# Patient Record
Sex: Male | Born: 1965 | Hispanic: No | Marital: Married | Smoking: Current every day smoker
Health system: Southern US, Community
[De-identification: ages and names within clinical notes are randomized; demographics above are authoritative.]

## PROBLEM LIST (undated history)

## (undated) DIAGNOSIS — E785 Hyperlipidemia, unspecified: Secondary | ICD-10-CM

## (undated) DIAGNOSIS — I739 Peripheral vascular disease, unspecified: Secondary | ICD-10-CM

## (undated) DIAGNOSIS — Q2572 Congenital pulmonary arteriovenous malformation: Principal | ICD-10-CM

## (undated) DIAGNOSIS — R7301 Impaired fasting glucose: Secondary | ICD-10-CM

## (undated) DIAGNOSIS — F17200 Nicotine dependence, unspecified, uncomplicated: Secondary | ICD-10-CM

## (undated) DIAGNOSIS — I350 Nonrheumatic aortic (valve) stenosis: Secondary | ICD-10-CM

## (undated) DIAGNOSIS — M549 Dorsalgia, unspecified: Secondary | ICD-10-CM

## (undated) DIAGNOSIS — R2689 Other abnormalities of gait and mobility: Secondary | ICD-10-CM

## (undated) DIAGNOSIS — H269 Unspecified cataract: Secondary | ICD-10-CM

## (undated) DIAGNOSIS — I6529 Occlusion and stenosis of unspecified carotid artery: Secondary | ICD-10-CM

## (undated) DIAGNOSIS — I700 Atherosclerosis of aorta: Secondary | ICD-10-CM

## (undated) DIAGNOSIS — I351 Nonrheumatic aortic (valve) insufficiency: Secondary | ICD-10-CM

## (undated) DIAGNOSIS — I10 Essential (primary) hypertension: Secondary | ICD-10-CM

## (undated) HISTORY — DX: Other abnormalities of gait and mobility: R26.89

## (undated) HISTORY — DX: Hyperlipidemia, unspecified: E78.5

## (undated) HISTORY — DX: Nicotine dependence, unspecified, uncomplicated: F17.200

## (undated) HISTORY — DX: Atherosclerosis of aorta: I70.0

## (undated) HISTORY — PX: CATARACT EXTRACTION: SUR2

## (undated) HISTORY — DX: Impaired fasting glucose: R73.01

## (undated) HISTORY — DX: Occlusion and stenosis of unspecified carotid artery: I65.29

## (undated) HISTORY — DX: Dorsalgia, unspecified: M54.9

## (undated) HISTORY — DX: Peripheral vascular disease, unspecified: I73.9

## (undated) HISTORY — DX: Unspecified cataract: H26.9

## (undated) HISTORY — DX: Nonrheumatic aortic (valve) insufficiency: I35.1

---

## 1999-03-09 ENCOUNTER — Emergency Department (HOSPITAL_COMMUNITY): Admission: EM | Admit: 1999-03-09 | Discharge: 1999-03-09 | Payer: Self-pay | Admitting: *Deleted

## 2004-03-30 ENCOUNTER — Emergency Department (HOSPITAL_COMMUNITY): Admission: EM | Admit: 2004-03-30 | Discharge: 2004-03-30 | Payer: Self-pay | Admitting: Emergency Medicine

## 2004-09-28 ENCOUNTER — Emergency Department (HOSPITAL_COMMUNITY): Admission: EM | Admit: 2004-09-28 | Discharge: 2004-09-28 | Payer: Self-pay | Admitting: Emergency Medicine

## 2005-02-08 ENCOUNTER — Emergency Department (HOSPITAL_COMMUNITY): Admission: EM | Admit: 2005-02-08 | Discharge: 2005-02-08 | Payer: Self-pay | Admitting: Emergency Medicine

## 2013-10-08 ENCOUNTER — Other Ambulatory Visit: Payer: Self-pay | Admitting: *Deleted

## 2013-10-08 DIAGNOSIS — M79609 Pain in unspecified limb: Secondary | ICD-10-CM

## 2013-10-21 ENCOUNTER — Encounter: Payer: Self-pay | Admitting: Vascular Surgery

## 2013-10-28 ENCOUNTER — Encounter: Payer: Self-pay | Admitting: Vascular Surgery

## 2013-10-29 ENCOUNTER — Ambulatory Visit (INDEPENDENT_AMBULATORY_CARE_PROVIDER_SITE_OTHER): Payer: Managed Care, Other (non HMO) | Admitting: Vascular Surgery

## 2013-10-29 ENCOUNTER — Encounter: Payer: Self-pay | Admitting: Vascular Surgery

## 2013-10-29 ENCOUNTER — Ambulatory Visit (HOSPITAL_COMMUNITY)
Admission: RE | Admit: 2013-10-29 | Discharge: 2013-10-29 | Disposition: A | Discharge status: Home / Self Care | Payer: Managed Care, Other (non HMO) | Source: Ambulatory Visit | Attending: Vascular Surgery | Admitting: Vascular Surgery

## 2013-10-29 VITALS — BP 151/77 | HR 74 | Ht 68.0 in | Wt 169.3 lb

## 2013-10-29 DIAGNOSIS — I739 Peripheral vascular disease, unspecified: Secondary | ICD-10-CM

## 2013-10-29 DIAGNOSIS — M79609 Pain in unspecified limb: Secondary | ICD-10-CM | POA: Insufficient documentation

## 2013-10-29 NOTE — Progress Notes (Signed)
Patient name: Marily LenteMustapha Drost MRN: [id number] DOB: 11/19/1965 Sex: male   Referred by: Di KindleAltisar  Reason for referral:  Chief Complaint  Patient presents with  . New Evaluation    intermittent claudication Lt LE    HISTORY OF PRESENT ILLNESS:  The patient presents today for evaluation of left calf discomfort. He is a very pleasant 48 year old gentleman smoker. He reports that he was recently traveling out of the country and was doing a great deal of walking particularly up and down stairs and up and down hills. He noted pain in his left calf that resolved with resting. He specifically denies any thigh pain. On his routine daily activity at home he does not notice a great deal of discomfort associated with this. He does report that when he mows his yard with a walking lower that he does notice the same sensation that resolves with rest. He denies any prior history of tissue loss or arterial rest pain. Has no cardiac or cerebrovascular past medical history  Past Medical History  Diagnosis Date  . Peripheral vascular disease   . Dizziness and giddiness   . Back pain   . Loss of balance     History reviewed. No pertinent past surgical history.  History   Social History  . Marital Status: Married    Spouse Name: N/A    Number of Children: N/A  . Years of Education: N/A   Occupational History  . Not on file.   Social History Main Topics  . Smoking status: Current Every Day Smoker -- 0.50 packs/day for 30 years    Types: Cigarettes  . Smokeless tobacco: Never Used  . Alcohol Use: 3.6 oz/week    6 Cans of beer per week  . Drug Use: No  . Sexual Activity: Not on file   Other Topics Concern  . Not on file   Social History Narrative  . No narrative on file    Family History  Problem Relation Age of Onset  . CVA Mother     Allergies as of 10/29/2013  . (No Known Allergies)    Current Outpatient Prescriptions on File Prior to Visit  Medication Sig Dispense  Refill  . meclizine (ANTIVERT) 25 MG tablet Take 25 mg by mouth 3 (three) times daily.       No current facility-administered medications on file prior to visit.     REVIEW OF SYSTEMS:  Positives indicated with an "X"  CARDIOVASCULAR:  [ ]  chest pain   [ ]  chest pressure   [ ]  palpitations   [ ]  orthopnea   [ ]  dyspnea on exertion   [x ] claudication   [ ]  rest pain   [ ]  DVT   [ ]  phlebitis PULMONARY:   [ ]  productive cough   [ ]  asthma   [ ]  wheezing NEUROLOGIC:   [ ]  weakness  [ ]  paresthesias  [ ]  aphasia  [ ]  amaurosis  [ ]  dizziness HEMATOLOGIC:   [ ]  bleeding problems   [ ]  clotting disorders MUSCULOSKELETAL:  [ ]  joint pain   [ ]  joint swelling GASTROINTESTINAL: [ ]   blood in stool  [ ]   hematemesis GENITOURINARY:  [ ]   dysuria  [ ]   hematuria PSYCHIATRIC:  [ ]  history of major depression INTEGUMENTARY:  [ ]  rashes  [ ]  ulcers CONSTITUTIONAL:  [ ]  fever   [ ]  chills  PHYSICAL EXAMINATION:  General: The patient is a well-nourished male, in no  acute distress. Vital signs are BP 151/77  Pulse 74  Ht 5\' 8"  (1.727 m)  Wt 169 lb 4.8 oz (76.794 kg)  BMI 25.75 kg/m2  SpO2 98% Pulmonary: There is a good air exchange bilaterally without wheezing or rales. Abdomen: Soft and non-tender with normal pitch bowel sounds. Musculoskeletal: There are no major deformities.  There is no significant extremity pain. Neurologic: No focal weakness or paresthesias are detected, Skin: There are no ulcer or rashes noted. Psychiatric: The patient has normal affect. Cardiovascular: There is a regular rate and rhythm without significant murmur appreciated. Carotid arteries without bruits bilaterally Pulse status 2+ radial and 2+ femoral pulses bilaterally. Absent left popliteal and left distal pulses. On the right he has a diminished dorsalis pedis pulse but does have a palpable posterior tibial pulse which is normal.  [facility] Vascular Lab Studies:  Ordered and Independently Reviewed this reveals a  normal ankle arm index on the right with normal triphasic waveforms. On the left he has monophasic waveforms and ankle arm index of 0.52.  Impression and Plan:  Left leg intermittent claudication related to left superficial femoral artery occlusive disease. After extensive discussion with the patient. I explained the etiology of this. I explained this is directly related to cigarette smoking and the critical need for cigarette smoking cessation. I did explain that the smoking cessation would slow his progression and would also allow him to walk further before he developed claudication symptoms. Also explained the importance of exercise walking program. I explained he could have further evaluation with arteriography and revascularization either with balloon and stenting over surgical bypass. I explained we would certainly recommend reserving this for an intolerable symptoms. He currently is able to tolerate his level of claudication. He will work on his smoking cessation walking program. We will see him again in 3 months for continued discussion. I did explain symptoms of critical limb ischemia to him and he will notify us immediately should this occur. I explained this would be highly unlikely    Jamiesha Victoria [facility] [place] Office: [phone]

## 2014-01-28 ENCOUNTER — Ambulatory Visit: Payer: Managed Care, Other (non HMO) | Admitting: Vascular Surgery

## 2014-02-17 ENCOUNTER — Encounter: Payer: Self-pay | Admitting: Vascular Surgery

## 2014-02-18 ENCOUNTER — Ambulatory Visit: Payer: Managed Care, Other (non HMO) | Admitting: Vascular Surgery

## 2014-03-17 ENCOUNTER — Encounter: Payer: Self-pay | Admitting: Vascular Surgery

## 2014-03-18 ENCOUNTER — Encounter: Payer: Self-pay | Admitting: Vascular Surgery

## 2014-03-18 ENCOUNTER — Ambulatory Visit (INDEPENDENT_AMBULATORY_CARE_PROVIDER_SITE_OTHER): Payer: Managed Care, Other (non HMO) | Admitting: Vascular Surgery

## 2014-03-18 VITALS — BP 134/80 | HR 78 | Resp 16 | Ht 68.5 in | Wt 177.0 lb

## 2014-03-18 DIAGNOSIS — I70219 Atherosclerosis of native arteries of extremities with intermittent claudication, unspecified extremity: Secondary | ICD-10-CM

## 2014-03-18 NOTE — Progress Notes (Signed)
Here today for continued discussion of his left leg claudication. He reports that he is trying to quit smoking but still does smoke. He is being very active in his walking program. He reports that if he does a great deal of walking at [employer] he senses some calf claudication but his routine activities are tolerable. He has no tissue loss and the rest pain  On physical exam he has no change. He has no tissue loss in his lower extremity. Does have a palpable femoral and absent popliteal pulse on the left.  I again reviewed the superficial femoral artery occlusion with the patient. I have recommended against any intervention at this time. He is tolerating this level claudication. I did explain the critical importance of walking program and smoking cessation. He does notify should he develop any tissue loss or worsening pain. Otherwise we see on as-needed basis. Did discuss about recommend daily baby aspirin therapy for prevention of atherosclerotic progression

## 2014-09-09 ENCOUNTER — Institutional Professional Consult (permissible substitution): Payer: Self-pay | Admitting: Medical

## 2014-09-16 ENCOUNTER — Ambulatory Visit (INDEPENDENT_AMBULATORY_CARE_PROVIDER_SITE_OTHER): Payer: Managed Care, Other (non HMO) | Admitting: Medical

## 2014-09-16 ENCOUNTER — Encounter: Payer: Self-pay | Admitting: Medical

## 2014-09-16 VITALS — BP 170/80 | HR 78 | Temp 98.4°F | Resp 14 | Ht 68.0 in | Wt 169.0 lb

## 2014-09-16 DIAGNOSIS — R011 Cardiac murmur, unspecified: Secondary | ICD-10-CM | POA: Diagnosis not present

## 2014-09-16 DIAGNOSIS — I70209 Unspecified atherosclerosis of native arteries of extremities, unspecified extremity: Secondary | ICD-10-CM | POA: Diagnosis not present

## 2014-09-16 DIAGNOSIS — Z720 Tobacco use: Secondary | ICD-10-CM | POA: Diagnosis not present

## 2014-09-16 DIAGNOSIS — F17200 Nicotine dependence, unspecified, uncomplicated: Secondary | ICD-10-CM

## 2014-09-16 DIAGNOSIS — I10 Essential (primary) hypertension: Secondary | ICD-10-CM

## 2014-09-16 LAB — HEMOGLOBIN A1C
Hgb A1c MFr Bld: 5.8 % — ABNORMAL HIGH (ref <5.7)
Mean Plasma Glucose: 120 mg/dL — ABNORMAL HIGH (ref <117)

## 2014-09-16 LAB — CBC
HCT: 47.1 % (ref 39.0–52.0)
Hemoglobin: 16.6 g/dL (ref 13.0–17.0)
MCH: 33.2 pg (ref 26.0–34.0)
MCHC: 35.2 g/dL (ref 30.0–36.0)
MCV: 94.2 fL (ref 78.0–100.0)
MPV: 10.3 fL (ref 8.6–12.4)
Platelets: 200 10*3/uL (ref 150–400)
RBC: 5 MIL/uL (ref 4.22–5.81)
RDW: 14 % (ref 11.5–15.5)
WBC: 7.8 10*3/uL (ref 4.0–10.5)

## 2014-09-16 LAB — COMPREHENSIVE METABOLIC PANEL
ALT: 103 U/L — ABNORMAL HIGH (ref 0–53)
AST: 47 U/L — ABNORMAL HIGH (ref 0–37)
Albumin: 4.4 g/dL (ref 3.5–5.2)
Alkaline Phosphatase: 56 U/L (ref 39–117)
BUN: 9 mg/dL (ref 6–23)
CO2: 23 mEq/L (ref 19–32)
Calcium: 9.7 mg/dL (ref 8.4–10.5)
Chloride: 104 mEq/L (ref 96–112)
Creat: 0.76 mg/dL (ref 0.50–1.35)
Glucose, Bld: 93 mg/dL (ref 70–99)
Potassium: 4.2 mEq/L (ref 3.5–5.3)
Sodium: 139 mEq/L (ref 135–145)
Total Bilirubin: 0.7 mg/dL (ref 0.2–1.2)
Total Protein: 7.5 g/dL (ref 6.0–8.3)

## 2014-09-16 LAB — LIPID PANEL
Cholesterol: 261 mg/dL — ABNORMAL HIGH (ref 0–200)
HDL: 39 mg/dL — ABNORMAL LOW (ref >=40)
LDL Cholesterol: 197 mg/dL — ABNORMAL HIGH (ref 0–99)
Total CHOL/HDL Ratio: 6.7 Ratio
Triglycerides: 127 mg/dL (ref <150)
VLDL: 25 mg/dL (ref 0–40)

## 2014-09-16 LAB — TSH: TSH: 0.317 u[IU]/mL — ABNORMAL LOW (ref 0.350–4.500)

## 2014-09-16 NOTE — Progress Notes (Signed)
Subjective: Here as a new patient.   From [place] originally, been [place] years.   Been seeing dermatologist for skin issues of legs, recently given triamcinolone cream, but dermatologist advised he see PCP to be evaluated for vascular or diabetic issues that may be causing his skin issues.   He has a 25 pack year smoking hx/o, smokers 1ppd, and has hx/o peripheral vascular disease in the last year, left leg, advised to consider revascularization surgery but for now is holding off since the pain is management in the legs/ claudication.    He denies SOB, chest pain, edema, no hx/o MI or hypertension.   No other aggravating or relieving factors. No other complaint.  No other prior medical history Mom with hx/o stroke, but no other significant family history He does drink 2-3 beers nightly  ROS as in subjective  Objective: BP 170/80 mmHg  Pulse 78  Temp(Src) 98.4 F (36.9 C) (Oral)  Resp 14  Ht 5\' 8"  (1.727 m)  Wt 169 lb (76.658 kg)  BMI 25.70 kg/m2  General appearance: alert, no distress, WD/WN Neck: supple, no lymphadenopathy, no thyromegaly, no masses, no bruits Heart: 2/6 systolic murmur heard best in left lower sternal border, otherwise RRR, normal S1, S2 Lungs: CTA bilaterally, no wheezes, rhonchi, or rales Abdomen: +bs, soft, non tender, non distended, no masses, no hepatomegaly, no splenomegaly, no bruits Extremities: no edema, no cyanosis, no clubbing Pulses: absent left pedal pulses, otherwise 2+ symmetric, upper and lower extremities, normal cap refill Neurological: alert, oriented x 3, CN2-12 intact, strength normal upper extremities and lower extremities, sensation normal throughout, DTRs 2+ throughout, no cerebellar signs, gait normal Psychiatric: normal affect, behavior normal, pleasant     Adult ECG Report  Indication: elevated BP without HTN diagnosis  Rate: 69 bpm  Rhythm: normal sinus rhythm  QRS Axis: 34 degrees  PR Interval: 176ms  QRS  Duration: 82ms  QTc: 426ms  Conduction Disturbances: none  Other Abnormalities: none  Patient's cardiac risk factors are: male gender and smoking/ tobacco exposure.  EKG comparison: none  Narrative Interpretation: normal EKG    Assessment: Encounter Diagnoses  Name Primary?  . Essential hypertension Yes  . Atherosclerotic peripheral vascular disease   . Smoker   . Heart murmur     Plan: Advised of diagnosis of hypertension, discussed relationship of PAD/PVD with risks for other vascular issues such as CAD, stroke risks, etc.  Strongly advised he consider stopping tobacco. EKG and labs today.   Murmur may be new, no notations in the limited prior records.   Will likely needed cardiac referral.  F/u pending labs.

## 2014-09-17 ENCOUNTER — Other Ambulatory Visit: Payer: Self-pay | Admitting: Medical

## 2014-09-17 MED ORDER — PERINDOPRIL ARG-AMLODIPINE 3.5-2.5 MG PO TABS: 1.0000 | ORAL_TABLET | Freq: Every day | ORAL | Status: DC

## 2014-09-23 ENCOUNTER — Encounter: Payer: Self-pay | Admitting: Medical

## 2014-09-23 ENCOUNTER — Ambulatory Visit (INDEPENDENT_AMBULATORY_CARE_PROVIDER_SITE_OTHER): Payer: Managed Care, Other (non HMO) | Admitting: Medical

## 2014-09-23 ENCOUNTER — Telehealth: Payer: Self-pay | Admitting: Medical

## 2014-09-23 VITALS — BP 130/70 | HR 75 | Resp 15 | Wt 169.0 lb

## 2014-09-23 DIAGNOSIS — R011 Cardiac murmur, unspecified: Secondary | ICD-10-CM | POA: Diagnosis not present

## 2014-09-23 DIAGNOSIS — R7989 Other specified abnormal findings of blood chemistry: Secondary | ICD-10-CM

## 2014-09-23 DIAGNOSIS — R946 Abnormal results of thyroid function studies: Secondary | ICD-10-CM | POA: Diagnosis not present

## 2014-09-23 DIAGNOSIS — R7301 Impaired fasting glucose: Secondary | ICD-10-CM | POA: Diagnosis not present

## 2014-09-23 DIAGNOSIS — Z658 Other specified problems related to psychosocial circumstances: Secondary | ICD-10-CM

## 2014-09-23 DIAGNOSIS — E785 Hyperlipidemia, unspecified: Secondary | ICD-10-CM

## 2014-09-23 DIAGNOSIS — R945 Abnormal results of liver function studies: Secondary | ICD-10-CM

## 2014-09-23 DIAGNOSIS — Z720 Tobacco use: Secondary | ICD-10-CM

## 2014-09-23 DIAGNOSIS — I10 Essential (primary) hypertension: Secondary | ICD-10-CM | POA: Diagnosis not present

## 2014-09-23 DIAGNOSIS — F17200 Nicotine dependence, unspecified, uncomplicated: Secondary | ICD-10-CM

## 2014-09-23 DIAGNOSIS — Z87898 Personal history of other specified conditions: Secondary | ICD-10-CM

## 2014-09-23 DIAGNOSIS — I739 Peripheral vascular disease, unspecified: Secondary | ICD-10-CM

## 2014-09-23 LAB — T3: T3, Total: 106.4 ng/dL (ref 80.0–204.0)

## 2014-09-23 LAB — TSH: TSH: 0.239 u[IU]/mL — ABNORMAL LOW (ref 0.350–4.500)

## 2014-09-23 LAB — T4, FREE: Free T4: 1.46 ng/dL (ref 0.80–1.80)

## 2014-09-23 MED ORDER — LISINOPRIL-HYDROCHLOROTHIAZIDE 20-12.5 MG PO TABS: 1.0000 | ORAL_TABLET | Freq: Every day | ORAL | Status: DC

## 2014-09-23 MED ORDER — ASPIRIN EC 81 MG PO TBEC: 81.0000 mg | DELAYED_RELEASE_TABLET | Freq: Every day | ORAL | Status: DC

## 2014-09-23 MED ORDER — NICOTINE 21 MG/24HR TD PT24: 21.0000 mg | MEDICATED_PATCH | Freq: Every day | TRANSDERMAL | Status: DC

## 2014-09-23 MED ORDER — ROSUVASTATIN CALCIUM 20 MG PO TABS: 20.0000 mg | ORAL_TABLET | Freq: Every day | ORAL | Status: DC

## 2014-09-23 NOTE — Telephone Encounter (Signed)
Refer to cardiology for murmur and general cardiac eval given hx/o peripheral vascular disease, smoker, HTN

## 2014-09-23 NOTE — Patient Instructions (Signed)
Encounter Diagnoses  Name Primary?  . Essential hypertension Yes  . Abnormal thyroid blood test   . Peripheral vascular disease   . Heart murmur   . Hyperlipidemia   . Smoker   . Abnormal liver function test    Recommendations:  When you run out of the current blood pressure pill, then change to Lisinopril HCT once daily in the morning which would be cheaper  We are checking additional labs today regarding Thyroid today  Your liver tests were abnormal, likely due to alcohol use. So stop alcohol or limit alcohol and lets recheck labs in 3 months  Begin Crestor tablet daily at bedtime for cholesterol  Begin Aspirin 81mg  daily at bedtime for cholesterol and peripheral vascular disease  STOP smoking . You can begin Nicotine patch daily for tobacco cessation  We are referring you to cardiology for consult regarding heart murmur and history of peripheral vascular disease

## 2014-09-23 NOTE — Progress Notes (Signed)
Subjective: Here for f/u.  I saw him recently for new patient visit.  From [place] originally, been [place] in [place] 20 years.   At last visit we did labs and discussed his hx/o peripheral vascular disease.  Been seeing dermatologist for skin issues of legs, recently given triamcinolone cream, but dermatologist advised he see PCP to be evaluated for vascular or diabetic issues that may be causing his skin issues.   He has a 25 pack year smoking hx/o, smokers 1ppd, and has hx/o peripheral vascular disease in the last year, left leg, advised to consider revascularization surgery but for now is holding off since the pain is management in the legs/ claudication.   He denies SOB, chest pain, edema, no hx/o MI or hypertension.   He started the BP medication but it was $50, wants something cheaper.  No other aggravating or relieving factors. No other complaint.  No other prior medical history Mom with hx/o stroke, but no other significant family history He does drink 2-3 beers nightly  ROS as in subjective  Objective: BP 130/70 mmHg  Pulse 75  Resp 15  Wt 169 lb (76.658 kg)  General appearance: alert, no distress, WD/WN Neck: supple, no lymphadenopathy, no thyromegaly, no masses, no bruits Heart: 2/6 systolic murmur heard best in left lower sternal border, otherwise RRR, normal S1, S2 Lungs: CTA bilaterally, no wheezes, rhonchi, or rales Abdomen: +bs, soft, non tender, non distended, no masses, no hepatomegaly, no splenomegaly, no bruits Extremities: no edema, no cyanosis, no clubbing Pulses: absent left pedal pulses, otherwise 2+ symmetric, upper and lower extremities, normal cap refill Neurological: alert, oriented x 3, CN2-12 intact, strength normal upper extremities and lower extremities, sensation normal throughout, DTRs 2+ throughout, no cerebellar signs, gait normal    Assessment: Encounter Diagnoses  Name Primary?  . Essential hypertension Yes  . Abnormal thyroid blood test   .  Peripheral vascular disease   . Heart murmur   . Hyperlipidemia   . Smoker   . Abnormal liver function test   . Impaired fasting glucose   . History of alcohol use     Plan: We discussed his recent lab findings.   He has stopped alcohol. We will plan to recheck liver tests in 37mo.   Additional thyroid labs today.   Given cost issue, change to Lisinopril HCT for BP.   Begin Crestor and ASA.  Referral to cardiology.  Recommendations:  When you run out of the current blood pressure pill, then change to Lisinopril HCT once daily in the morning which would be cheaper  We are checking additional labs today regarding Thyroid today  Your liver tests were abnormal, likely due to alcohol use. So stop alcohol or limit alcohol and lets recheck labs in 3 months  Begin Crestor tablet daily at bedtime for cholesterol  Begin Aspirin 81mg  daily at bedtime for cholesterol and peripheral vascular disease  STOP smoking . You can begin Nicotine patch daily for tobacco cessation  We are referring you to cardiology for consult regarding heart murmur and history of peripheral vascular disease  F/u 37mo

## 2014-09-23 NOTE — Telephone Encounter (Signed)
Appointment with Dr. Donnie Ahotilley on 10/09/14 @ 200 pm OV notes, [facility]

## 2014-09-24 NOTE — Telephone Encounter (Signed)
Patient is aware of the cardiology referral. Patient ask me to leave appointment information up front for him to pick. Letter up front for patient to pick up.

## 2014-09-25 ENCOUNTER — Encounter (HOSPITAL_COMMUNITY): Payer: Self-pay | Admitting: Emergency Medicine

## 2014-09-25 DIAGNOSIS — Z79899 Other long term (current) drug therapy: Secondary | ICD-10-CM | POA: Diagnosis not present

## 2014-09-25 DIAGNOSIS — Z720 Tobacco use: Secondary | ICD-10-CM | POA: Diagnosis not present

## 2014-09-25 DIAGNOSIS — I10 Essential (primary) hypertension: Secondary | ICD-10-CM | POA: Diagnosis not present

## 2014-09-25 DIAGNOSIS — D72829 Elevated white blood cell count, unspecified: Secondary | ICD-10-CM | POA: Diagnosis not present

## 2014-09-25 DIAGNOSIS — R42 Dizziness and giddiness: Secondary | ICD-10-CM | POA: Insufficient documentation

## 2014-09-25 DIAGNOSIS — X58XXXA Exposure to other specified factors, initial encounter: Secondary | ICD-10-CM | POA: Insufficient documentation

## 2014-09-25 DIAGNOSIS — E785 Hyperlipidemia, unspecified: Secondary | ICD-10-CM | POA: Diagnosis not present

## 2014-09-25 DIAGNOSIS — E780 Pure hypercholesterolemia: Secondary | ICD-10-CM | POA: Diagnosis not present

## 2014-09-25 DIAGNOSIS — Z7982 Long term (current) use of aspirin: Secondary | ICD-10-CM | POA: Insufficient documentation

## 2014-09-25 DIAGNOSIS — T464X1A Poisoning by angiotensin-converting-enzyme inhibitors, accidental (unintentional), initial encounter: Secondary | ICD-10-CM | POA: Insufficient documentation

## 2014-09-25 LAB — CBC WITH DIFFERENTIAL/PLATELET
Basophils Absolute: 0 10*3/uL (ref 0.0–0.1)
Basophils Relative: 0 % (ref 0–1)
Eosinophils Absolute: 0.1 10*3/uL (ref 0.0–0.7)
Eosinophils Relative: 1 % (ref 0–5)
HCT: 47.6 % (ref 39.0–52.0)
Hemoglobin: 17.3 g/dL — ABNORMAL HIGH (ref 13.0–17.0)
Lymphocytes Relative: 24 % (ref 12–46)
Lymphs Abs: 3.9 10*3/uL (ref 0.7–4.0)
MCH: 34.2 pg — ABNORMAL HIGH (ref 26.0–34.0)
MCHC: 36.3 g/dL — ABNORMAL HIGH (ref 30.0–36.0)
MCV: 94.1 fL (ref 78.0–100.0)
Monocytes Absolute: 1.3 10*3/uL — ABNORMAL HIGH (ref 0.1–1.0)
Monocytes Relative: 8 % (ref 3–12)
Neutro Abs: 11.1 10*3/uL — ABNORMAL HIGH (ref 1.7–7.7)
Neutrophils Relative %: 67 % (ref 43–77)
Platelets: 222 10*3/uL (ref 150–400)
RBC: 5.06 MIL/uL (ref 4.22–5.81)
RDW: 12.5 % (ref 11.5–15.5)
WBC: 16.3 10*3/uL — ABNORMAL HIGH (ref 4.0–10.5)

## 2014-09-25 NOTE — Progress Notes (Signed)
LM to CB

## 2014-09-25 NOTE — ED Notes (Signed)
Pt. reports right fingers/toes cramping this evening with dizziness , alert and oriented/ respirations unlabored.

## 2014-09-26 ENCOUNTER — Telehealth: Payer: Self-pay | Admitting: Medical

## 2014-09-26 ENCOUNTER — Emergency Department (HOSPITAL_COMMUNITY)
Admission: EM | Admit: 2014-09-26 | Discharge: 2014-09-26 | Disposition: A | Discharge status: Home / Self Care | Payer: Managed Care, Other (non HMO) | Attending: Emergency Medicine | Admitting: Emergency Medicine

## 2014-09-26 DIAGNOSIS — T50901A Poisoning by unspecified drugs, medicaments and biological substances, accidental (unintentional), initial encounter: Secondary | ICD-10-CM

## 2014-09-26 DIAGNOSIS — R42 Dizziness and giddiness: Secondary | ICD-10-CM

## 2014-09-26 HISTORY — DX: Essential (primary) hypertension: I10

## 2014-09-26 LAB — COMPREHENSIVE METABOLIC PANEL
ALT: 103 U/L — ABNORMAL HIGH (ref 17–63)
AST: 49 U/L — ABNORMAL HIGH (ref 15–41)
Albumin: 4.6 g/dL (ref 3.5–5.0)
Alkaline Phosphatase: 57 U/L (ref 38–126)
Anion gap: 12 (ref 5–15)
BUN: 12 mg/dL (ref 6–20)
CO2: 24 mmol/L (ref 22–32)
Calcium: 9.6 mg/dL (ref 8.9–10.3)
Chloride: 97 mmol/L — ABNORMAL LOW (ref 101–111)
Creatinine, Ser: 0.94 mg/dL (ref 0.61–1.24)
GFR calc Af Amer: >60 mL/min (ref >60)
GFR calc non Af Amer: >60 mL/min (ref >60)
Glucose, Bld: 107 mg/dL — ABNORMAL HIGH (ref 65–99)
Potassium: 3.5 mmol/L (ref 3.5–5.1)
Sodium: 133 mmol/L — ABNORMAL LOW (ref 135–145)
Total Bilirubin: 0.9 mg/dL (ref 0.3–1.2)
Total Protein: 8.1 g/dL (ref 6.5–8.1)

## 2014-09-26 LAB — URINALYSIS, ROUTINE W REFLEX MICROSCOPIC
Bilirubin Urine: NEGATIVE
Glucose, UA: 100 mg/dL — AB
Hgb urine dipstick: NEGATIVE
Ketones, ur: NEGATIVE mg/dL
Leukocytes, UA: NEGATIVE
Nitrite: NEGATIVE
Protein, ur: NEGATIVE mg/dL
Specific Gravity, Urine: 1.008 (ref 1.005–1.030)
Urobilinogen, UA: 0.2 mg/dL (ref 0.0–1.0)
pH: 6 (ref 5.0–8.0)

## 2014-09-26 NOTE — Telephone Encounter (Signed)
I read his emergency dept note from yesterday.  I apologize if there was any confusion.     Make sure he is taking perindopril/amlodipine every morning until he runs out!  Then he will begin the Lisinopril HCT in the morning, not both at the same time  The Crestor and aspirin are taken together in the evening.

## 2014-09-26 NOTE — ED Provider Notes (Signed)
This chart was scribed for Terry MawKristen N Zanyla Klebba, DO by Lyndel SafeKaitlyn Shelton, ED Scribe. This patient was seen in room A09C/A09C and the patient's care was started 3:41 AM.  TIME SEEN: 3:41 AM  CHIEF COMPLAINT: Dizziness.   HPI: HPI Comments: Terry Gross is a 49 y.o. male with past medical history of HTN and HLD, who presents to the Emergency Department complaining of dizziness with associated bilateral hand and foot cramping today. Pt saw PCP Tysinger PA-C and was prescribed Prestalia for BP on 09/16/14.  States he has been taking this medication and doing well. Reports that he went back to see his primary care provider 2 days ago and was told he had high cholesterol. He was instructed that he would be started on medication for his cholesterol. He also complained his primary care physician that the Perindopril Arg-Amlodipine (Prestalia) 3.5-2.5 mg was expensive and he was given a prescription for lisinopril-hydrochlorothiazide (ZESTORETIC) 20-12.5 mg that he could start when his Prestalia has run out.  He states he accidentally begin taking those blood pressure medications and has felt lightheaded and had cramping in his hands and feet. Reports this has improved. States he last took the Prestalia last night and took the Zestoretic this morning.  Reports this was accidental.  BP is 128/70 mmHg currently.  He denies fever, coughing, vomiting, diarrhea, new onset pain, numbness, tingling or focal weakness. No chest pain or shortness of breath.  ROS: See HPI Constitutional: no fever  Eyes: no drainage  ENT: no runny nose   Cardiovascular:  no chest pain  Resp: no SOB  GI: no vomiting GU: no dysuria Integumentary: no rash  Allergy: no hives  Musculoskeletal: no leg swelling  Neurological: no slurred speech ROS otherwise negative  PAST MEDICAL HISTORY/PAST SURGICAL HISTORY:  Past Medical History  Diagnosis Date  . Peripheral vascular disease   . Dizziness and giddiness   . Back pain   . Loss of  balance   . Hypertension     MEDICATIONS:  Prior to Admission medications   Medication Sig Start Date End Date Taking? Authorizing Provider  aspirin EC 81 MG tablet Take 1 tablet (81 mg total) by mouth daily. 09/23/14  Yes Kermit Baloavid S Tysinger, PA-C  lisinopril-hydrochlorothiazide (ZESTORETIC) 20-12.5 MG per tablet Take 1 tablet by mouth daily. 09/23/14  Yes Kermit Baloavid S Tysinger, PA-C  Perindopril Arg-Amlodipine 3.5-2.5 MG TABS Take 1 tablet by mouth daily.   Yes Historical Provider, MD  nicotine (NICODERM CQ - DOSED IN MG/24 HOURS) 21 mg/24hr patch Place 1 patch (21 mg total) onto the skin daily. 09/23/14   Kermit Baloavid S Tysinger, PA-C  rosuvastatin (CRESTOR) 20 MG tablet Take 1 tablet (20 mg total) by mouth daily. 09/23/14   Jac Canavanavid S Tysinger, PA-C    ALLERGIES:  No Known Allergies  SOCIAL HISTORY:  History  Substance Use Topics  . Smoking status: Current Every Day Smoker -- 0.50 packs/day for 30 years    Types: Cigarettes  . Smokeless tobacco: Never Used  . Alcohol Use: 3.6 oz/week    6 Cans of beer per week    FAMILY HISTORY: Family History  Problem Relation Age of Onset  . CVA Mother     EXAM: Triage Vitals: BP 147/72 mmHg  Pulse 71  Temp(Src) 98.3 F (36.8 C) (Oral)  Resp 14  Wt 166 lb 9.6 oz (75.569 kg)  SpO2 99% CONSTITUTIONAL: Alert and oriented and responds appropriately to questions. Well-appearing; well-nourished, smiling, laughing HEAD: Normocephalic EYES: Conjunctivae clear, PERRL ENT: normal nose;  no rhinorrhea; moist mucous membranes; pharynx without lesions noted NECK: Supple, no meningismus, no LAD  CARD: RRR; S1 and S2 appreciated; no murmurs, no clicks, no rubs, no gallops RESP: Normal chest excursion without splinting or tachypnea; breath sounds clear and equal bilaterally; no wheezes, no rhonchi, no rales, no hypoxia or respiratory distress, speaking full sentences ABD/GI: Normal bowel sounds; non-distended; soft, non-tender, no rebound, no guarding, no peritoneal  signs BACK:  The back appears normal and is non-tender to palpation, there is no CVA tenderness EXT: Normal ROM in all joints; non-tender to palpation; no edema; normal capillary refill; no cyanosis, no calf tenderness or swelling    SKIN: Normal color for age and race; warm NEURO: Moves all extremities equally, sensation to light touch intact diffusely, cranial nerves II through XII intact, normal gait PSYCH: The patient's mood and manner are appropriate. Grooming and personal hygiene are appropriate.  MEDICAL DECISION MAKING: Patient here with accidental overdose on blood pressure medication. His blood pressure in the ED has been within normal limits. His labs show mild leukocytosis which is likely reactive.  Electrolytes within normal limits. He reports feeling better. Have advised him to stop his lisinopril/hydrochlorothiazide and take his Prestalia starting tomorrow morning.  Have discussed return precautions. Have recommended close outpatient follow-up. He verbalizes understanding and is comfortable with plan.     EKG Interpretation  Date/Time:  Friday Sep 26 2014 03:32:36 EDT Ventricular Rate:  66 PR Interval:  179 QRS Duration: 86 QT Interval:  427 QTC Calculation: 447 R Axis:   31 Text Interpretation:  Sinus rhythm ST elev, probable normal early repol pattern No old tracing to compare Confirmed by Rana Adorno,  DO, Khy [place] ([id number]) on 09/26/2014 4:18:53 AM        I personally performed the services described in this documentation, which was scribed in my presence. The recorded information has been reviewed and is accurate.     Terry MawKristen N [name], DO 09/26/14 [phone]

## 2014-09-26 NOTE — Telephone Encounter (Signed)
I went over [employer] Tysinger PA message with the patient in full detail twice and the patient states to me that he understood.

## 2014-09-26 NOTE — Telephone Encounter (Signed)
LMOM TO CB. CLS 

## 2014-09-26 NOTE — Discharge Instructions (Signed)
Your symptoms today were likely caused by the fact that you were accidentally taking four blood pressure medications. Your blood work today was reassuring as was your blood pressure. I recommend you hold your lisinopril/hydrochlorothiazide. You may re-start your Prestalia on Saturday morning.   Dizziness Dizziness is a common problem. It is a feeling of unsteadiness or light-headedness. You may feel like you are about to faint. Dizziness can lead to injury if you stumble or fall. A person of any age group can suffer from dizziness, but dizziness is more common in older adults. CAUSES  Dizziness can be caused by many different things, including:  Middle ear problems.  Standing for too long.  Infections.  An allergic reaction.  Aging.  An emotional response to something, such as the sight of blood.  Side effects of medicines.  Tiredness.  Problems with circulation or blood pressure.  Excessive use of alcohol or medicines, or illegal drug use.  Breathing too fast (hyperventilation).  An irregular heart rhythm (arrhythmia).  A low red blood cell count (anemia).  Pregnancy.  Vomiting, diarrhea, fever, or other illnesses that cause body fluid loss (dehydration).  Diseases or conditions such as Parkinson's disease, high blood pressure (hypertension), diabetes, and thyroid problems.  Exposure to extreme heat. DIAGNOSIS  Your health care provider will ask about your symptoms, perform a physical exam, and perform an electrocardiogram (ECG) to record the electrical activity of your heart. Your health care provider may also perform other heart or blood tests to determine the cause of your dizziness. These may include:  Transthoracic echocardiogram (TTE). During echocardiography, sound waves are used to evaluate how blood flows through your heart.  Transesophageal echocardiogram (TEE).  Cardiac monitoring. This allows your health care provider to monitor your heart rate and rhythm  in real time.  Holter monitor. This is a portable device that records your heartbeat and can help diagnose heart arrhythmias. It allows your health care provider to track your heart activity for several days if needed.  Stress tests by exercise or by giving medicine that makes the heart beat faster. TREATMENT  Treatment of dizziness depends on the cause of your symptoms and can vary greatly. HOME CARE INSTRUCTIONS   Drink enough fluids to keep your urine clear or pale yellow. This is especially important in very hot weather. In older adults, it is also important in cold weather.  Take your medicine exactly as directed if your dizziness is caused by medicines. When taking blood pressure medicines, it is especially important to get up slowly.  Rise slowly from chairs and steady yourself until you feel okay.  In the morning, first sit up on the side of the bed. When you feel okay, stand slowly while holding onto something until you know your balance is fine.  Move your legs often if you need to stand in one place for a long time. Tighten and relax your muscles in your legs while standing.  Have someone stay with you for 1-2 days if dizziness continues to be a problem. Do this until you feel you are well enough to stay alone. Have the person call your health care provider if he or she notices changes in you that are concerning.  Do not drive or use heavy machinery if you feel dizzy.  Do not drink alcohol. SEEK IMMEDIATE MEDICAL CARE IF:   Your dizziness or light-headedness gets worse.  You feel nauseous or vomit.  You have problems talking, walking, or using your arms, hands, or legs.  You feel weak.  You are not thinking clearly or you have trouble forming sentences. It may take a friend or family member to notice this.  You have chest pain, abdominal pain, shortness of breath, or sweating.  Your vision changes.  You notice any bleeding.  You have side effects from medicine  that seems to be getting worse rather than better. MAKE SURE YOU:   Understand these instructions.  Will watch your condition.  Will get help right away if you are not doing well or get worse. Document Released: 10/19/2000 Document Revised: 04/30/2013 Document Reviewed: 11/12/2010 [facility] Patient Information 2015 [place], [place]. This information is not intended to replace advice given to you by your health care provider. Make sure you discuss any questions you have with your health care provider.  Accidental Overdose A drug overdose occurs when a chemical substance (drug or medication) is used in amounts large enough to overcome a person. This may result in severe illness or death. This is a type of poisoning. Accidental overdoses of medications or other substances come from a variety of reasons. When this happens accidentally, it is often because the person taking the substance does not know enough about what they have taken. Drugs which commonly cause overdose deaths are alcohol, psychotropic medications (medications which affect the mind), pain medications, illegal drugs (street drugs) such as cocaine and heroin, and multiple drugs taken at the same time. It may result from careless behavior (such as over-indulging at a party). Other causes of overdose may include multiple drug use, a lapse in memory, or drug use after a period of no drug use.  Sometimes overdosing occurs because a person cannot remember if they have taken their medication.  A common unintentional overdose in young children involves multi-vitamins containing iron. Iron is a part of the hemoglobin molecule in blood. It is used to transport oxygen to living cells. When taken in small amounts, iron allows the body to restock hemoglobin. In large amounts, it causes problems in the body. If this overdose is not treated, it can lead to death. Never take medicines that show signs of tampering or do not seem quite right. Never take  medicines in the dark or in poor lighting. Read the label and check each dose of medicine before you take it. When adults are poisoned, it happens most often through carelessness or lack of information. Taking medicines in the dark or taking medicine prescribed for someone else to treat the same type of problem is a dangerous practice. SYMPTOMS  Symptoms of overdose depend on the medication and amount taken. They can vary from over-activity with stimulant over-dosage, to sleepiness from depressants such as alcohol, narcotics and tranquilizers. Confusion, dizziness, nausea and vomiting may be present. If problems are severe enough coma and death may result. DIAGNOSIS  Diagnosis and management are generally straightforward if the drug is known. Otherwise it is more difficult. At times, certain symptoms and signs exhibited by the patient, or blood tests, can reveal the drug in question.  TREATMENT  In an emergency department, most patients can be treated with supportive measures. Antidotes may be available if there has been an overdose of opioids or benzodiazepines. A rapid improvement will often occur if this is the cause of overdose. At home or away from medical care:  There may be no immediate problems or warning signs in children.  Not everything works well in all cases of poisoning.  Take immediate action. Poisons may act quickly.  If you think someone  has swallowed medicine or a household product, and the person is unconscious, having seizures (convulsions), or is not breathing, immediately call for an ambulance. IF a person is conscious and appears to be doing OK but has swallowed a poison:  Do not wait to see what effect the poison will have. Immediately call a poison control center (listed in the white pages of your telephone book under "Poison Control" or inside the front cover with other emergency numbers). Some poison control centers have TTY capability for the deaf. Check with your local  center if you or someone in your family requires this service.  Keep the container so you can read the label on the product for ingredients.  Describe what, when, and how much was taken and the age and condition of the person poisoned. Inform them if the person is vomiting, choking, drowsy, shows a change in color or temperature of skin, is conscious or unconscious, or is convulsing.  Do not cause vomiting unless instructed by medical personnel. Do not induce vomiting or force liquids into a person who is convulsing, unconscious, or very drowsy. Stay calm and in control.   Activated charcoal also is sometimes used in certain types of poisoning and you may wish to add a supply to your emergency medicines. It is available without a prescription. Call a poison control center before using this medication. PREVENTION  Thousands of children die every year from unintentional poisoning. This may be from household chemicals, poisoning from carbon monoxide in a car, taking their parent's medications, or simply taking a few iron pills or vitamins with iron. Poisoning comes from unexpected sources.  Store medicines out of the sight and reach of children, preferably in a locked cabinet. Do not keep medications in a food cabinet. Always store your medicines in a secure place. Get rid of expired medications.  If you have children living with you or have them as occasional guests, you should have child-resistant caps on your medicine containers. Keep everything out of reach. Child proof your home.  If you are called to the telephone or to answer the door while you are taking a medicine, take the container with you or put the medicine out of the reach of small children.  Do not take your medication in front of children. Do not tell your child how good a medication is and how good it is for them. They may get the idea it is more of a treat.  If you are an adult and have accidentally taken an overdose, you need to  consider how this happened and what can be done to prevent it from happening again. If this was from a street drug or alcohol, determine if there is a problem that needs addressing. If you are not sure a problems exists, it is easy to talk to a professional and ask them if they think you have a problem. It is better to handle this problem in this way before it happens again and has a much worse consequence. Document Released: 07/09/2004 Document Revised: 07/18/2011 Document Reviewed: 12/15/2008 [facility]Exit[facility] Patient Information 2015 [place]ExitCare, [place]LLC. This information is not intended to replace advice given to you by your health care provider. Make sure you discuss any questions you have with your health care provider.

## 2014-10-02 NOTE — Progress Notes (Signed)
Karren BurlyChandra spoke with patient 09/26/14 see note

## 2014-10-09 ENCOUNTER — Encounter: Payer: Self-pay | Admitting: Cardiology

## 2014-10-09 DIAGNOSIS — I739 Peripheral vascular disease, unspecified: Secondary | ICD-10-CM | POA: Insufficient documentation

## 2014-10-09 DIAGNOSIS — E782 Mixed hyperlipidemia: Secondary | ICD-10-CM | POA: Insufficient documentation

## 2014-10-09 DIAGNOSIS — Z720 Tobacco use: Secondary | ICD-10-CM

## 2014-10-09 DIAGNOSIS — E785 Hyperlipidemia, unspecified: Secondary | ICD-10-CM

## 2014-10-09 DIAGNOSIS — R011 Cardiac murmur, unspecified: Secondary | ICD-10-CM | POA: Insufficient documentation

## 2014-10-09 DIAGNOSIS — I10 Essential (primary) hypertension: Secondary | ICD-10-CM | POA: Insufficient documentation

## 2014-10-09 NOTE — Progress Notes (Signed)
Patient ID: Terry Gross, male   DOB: 1966/03/02, 49 y.o.   MRN: [id number]    Terry Gross    Date of visit:  10/09/2014 DOB:  08-23-1965    Age:  49 yrs. Medical record number:  [id number]     Account number:  [id number] Primary Care Provider: Robyne Gross ____________________________ CURRENT DIAGNOSES  1. Murmur  2. Hyperlipidemia  3. Essential hypertension  4. Atherosclerosis of native arteries of extremities with intermittent claudication, unspecified extremity  5. Nicotine dependence, cigarettes, uncomplicated  6. Nonrheumatic aortic (valve) stenosis ____________________________ ALLERGIES  No Known Allergies ____________________________ MEDICATIONS  1. Crestor 20 mg tablet, 1 p.o. daily  2. Aspirin Childrens 81 mg chewable tablet, 1 p.o. daily  3. Prestalia 3.5 mg-2.5 mg tablet, 1 p.o. daily  4. triamcinolone acetonide 0.5 % topical cream, PRN ____________________________ CHIEF COMPLAINTS  Claudication l leg  Murmur ____________________________ HISTORY OF PRESENT ILLNESS This 49 year old [place] male is seen for evaluation of a cardiac murmur and peripheral vascular disease. He has a previous history of hypertension and hyperlipidemia. About a year ago he developed symptoms of claudication and was seen by vascular surgery who found him to have a left superficial femoral artery occlusion. An exercise program was recommended at that time. The patient recently established with Terry Gross for primary care and was changed to lisinopril/HCTZ. He had gone to the emergency room because he took both this and the previous medication at  the same time and became hypotensive. On his examination and a systolic murmur was noted and Cardiologic examination was requested. The patient continues to have some claudication and limits his activity. He continues to smoke cigarettes but states that he is trying to quit. He denies chest pain suggestive of angina. He has no PND,  orthopnea, syncope, palpitations, or claudication. There is no history of rheumatic fever. ____________________________ PAST HISTORY  Past Medical Illnesses:  peripheral vascular disease, hypertension, hyperlipidemia;  Cardiovascular Illnesses:  no previous history of cardiac disease.;  Surgical Procedures:  no prior surgical procedures;  NYHA Classification:  I;  Canadian Angina Classification:  Class 0: Asymptomatic;  Cardiology Procedures-Invasive:  no history of prior cardiac procedures;  Cardiology Procedures-Noninvasive:  no previous non-invasive procedures;  LVEF not documented,   ____________________________ CARDIO-PULMONARY TEST DATES EKG Date:  10/09/2014;   ____________________________ FAMILY HISTORY Brother -- Brother alive and well Brother -- Brother alive and well Brother -- Brother alive and well Brother -- Brother alive and well Father -- Father dead, Neoplasm of bladder Mother -- Mother alive with problem, CVA, Brain disorder Sister -- Sister alive and well Sister -- Sister alive and well Sister -- Sister alive and well ____________________________ SOCIAL HISTORY Alcohol Use:  no alcohol use;  Smoking:  smokes less than 1 ppd, 30 pack year history;  Diet:  regular diet;  Lifestyle:  married;  Exercise:  no regular exercise;  Occupation:  Training and development officer;  Residence:  lives with wife;   ____________________________ REVIEW OF SYSTEMS General:  denies recent weight change, fatique or change in exercise tolerance.  Integumentary: recent rash and has seen dermatologist  Eyes: denies diplopia, history of glaucoma or visual problems. Ears, Nose, Throat, Mouth:  denies any hearing loss, epistaxis, hoarseness or difficulty speaking. Respiratory: denies dyspnea, cough, wheezing or hemoptysis. Cardiovascular:  please review HPI Abdominal: denies dyspepsia, GI bleeding, constipation, or diarrhea Genitourinary-Male: no dysuria, urgency, frequency, or nocturia  Musculoskeletal:  claudication symptoms  Neurological:  dizziness  Psychiatric:  denies depression or anxiety Hematological/Immunologic:  denies any food  allergies, bleeding disorders. ____________________________ PHYSICAL EXAMINATION VITAL SIGNS  Blood Pressure:  104/60 Sitting, Left arm, regular cuff  , 92/54 Standing, Left arm and regular cuff   Pulse:  88/min. Weight:  165.00 lbs. Height:  68"BMI: 25  Constitutional:  pleasant middle [place] appearing male in NAD Skin:  macular discoloration on both anterior shins with hair loss Head:  normocephalic, normal hair pattern, no masses or tenderness Eyes:  EOMS Intact, PERRLA, C and S clear, Funduscopic exam not done. ENT:  ears, nose and throat reveal no gross abnormalities.  Dentition good. Neck:  supple, without massess. No JVD, thyromegaly or carotid bruits. Carotid upstroke normal. Chest:  normal symmetry, clear to auscultation. Cardiac:  regular rhythm, normal S1 and S2, no S3 or S4, harsh grade 3/6 systolic murmur at aortic area radiating to neck, grade 1/6 diastolic murmur Abdomen:  abdomen soft,non-tender, no masses, no hepatospenomegaly, or aneurysm noted Peripheral Pulses:  femoral pulse(s) 2+, posterior tibial pulse(s) 2+ on the right, DP iminished on right, dorsalis pedis and posterior tibial pulses absent on the left Extremities & Back:  no deformities, clubbing, cyanosis, erythema or edema observed. Normal muscle strength and tone. Neurological:  no gross motor or sensory deficits noted, affect appropriate, oriented x3. ____________________________ MOST RECENT LIPID PANEL 09/16/14  CHOL TOTL 261 mg/dl, LDL 197 NM, HDL 39 mg/dl, TRIGLYCER 127 mg/dl, ALT 103 u/l, ALK PHOS 56 u/l, CHOL/HDL 6.7 (Calc) and AST 47 u/l ____________________________ IMPRESSIONS/PLAN  1. Loud systolic heart murmur that suggests aortic stenosis which is asymptomatic 2. Hypertension currently controlled 3. Hyperlipidemia under treatment 4. Peripheral vascular disease with intermittent  claudication of the left leg 5. Tobacco abuse advised to stop smoking  Recommendations:  Obtain echocardiogram to evaluate the cardiac murmur. I suspect from examination that it is a murmur of aortic stenosis. Discussed importance of stopping smoking. His blood pressure was somewhat below in the office today. Lipids are currently being treated and he likely will need to be treated LDL of less than 70. Followup after the echocardiogram.EKG shows voltage for LVH but is otherwise unremarkable. ____________________________ TODAYS ORDERS  1. 2D, color flow, doppler: At Patient [facility]venience  2. Return After Diagnostic Testing: Y  3. 12 Lead EKG: Today                       ____________________________ Cardiology Physician:  Kerry Hough MD [facility]

## 2014-11-06 ENCOUNTER — Telehealth: Payer: Self-pay | Admitting: Medical

## 2014-11-06 NOTE — Telephone Encounter (Signed)
Not typically a common side effect.  C/t for now and f/u next week

## 2014-11-06 NOTE — Telephone Encounter (Signed)
Left word for word message on machine  

## 2014-11-06 NOTE — Telephone Encounter (Signed)
Pt having eye pain and and little vision issues in left eye and noticed that is a side effect of Lisinopril. This all started after being on Lisinopril for a week

## 2014-12-15 ENCOUNTER — Encounter: Payer: Self-pay | Admitting: Cardiology

## 2014-12-15 ENCOUNTER — Telehealth: Payer: Self-pay | Admitting: Medical

## 2014-12-15 DIAGNOSIS — I351 Nonrheumatic aortic (valve) insufficiency: Secondary | ICD-10-CM

## 2014-12-15 HISTORY — DX: Nonrheumatic aortic (valve) insufficiency: I35.1

## 2014-12-15 NOTE — Telephone Encounter (Signed)
Please call patient re BP meds  He states he noticed bp has been real low over the last week. Top #'3 (only #'s he had) running  82, 68 and he talked to pharmacist and they told him maybe meds too strong. I believe he has not taken his bp meds the last couple of days and he says his bp is fine

## 2014-12-15 NOTE — Progress Notes (Signed)
Patient ID: Rakeen Gaillard, male   DOB: 07-Nov-1965, 49 y.o.   MRN: [id number]   Ashdon, Gillson    Date of visit:  12/15/2014 DOB:  [birth year]    Age:  49 yrs. Medical record number:  [id number]     Account number:  [id number] Primary Care Provider: Yvetta Coder ____________________________ CURRENT DIAGNOSES  1. Aortic valve insufficiency  2. Hyperlipidemia  3. Atherosclerosis of native arteries of extremities with intermittent claudication, unspecified extremity  4. Hypertensive heart disease without heart failure  5. Nicotine dependence, cigarettes, uncomplicated ____________________________ ALLERGIES  No Known Allergies ____________________________ MEDICATIONS  1. Crestor 20 mg tablet, 1 p.o. daily  2. Aspirin Childrens 81 mg chewable tablet, 1 p.o. daily  3. triamcinolone acetonide 0.5 % topical cream, PRN  4. lisinopril 20 mg-hydrochlorothiazide 12.5 mg tablet, 1 p.o. daily ____________________________ CHIEF COMPLAINTS  F/u p echo  Followup of Essential hypertension ____________________________ HISTORY OF PRESENT ILLNESS  Patient returns for cardiac followup. An echocardiogram today shows mild LVH with preserved LV systolic function. He has moderate aortic regurgitation noted that is the cause of his murmur. There did not appear to be any evidence of aortic stenosis although the valve is mildly thickened. He reports some mild low blood pressure at times. ____________________________ PAST HISTORY  Past Medical Illnesses:  peripheral vascular disease, hypertension, hyperlipidemia;  Cardiovascular Illnesses:  aortic insufficiency;  Surgical Procedures:  no prior surgical procedures;  NYHA Classification:  I;  Canadian Angina Classification:  Class 0: Asymptomatic;  Cardiology Procedures-Invasive:  no history of prior cardiac procedures;  Cardiology Procedures-Noninvasive:  echocardiogram August 2016;  LVEF of 60% documented via echocardiogram on 12/15/2014,    ____________________________ CARDIO-PULMONARY TEST DATES EKG Date:  10/09/2014;  Echocardiography Date: 12/15/2014;   ____________________________ FAMILY HISTORY Brother -- Brother alive and well Brother -- Brother alive and well Brother -- Brother alive and well Brother -- Brother alive and well Father -- Father dead, Neoplasm of bladder Mother -- Mother alive with problem, CVA, Brain disorder Sister -- Sister alive and well Sister -- Sister alive and well Sister -- Sister alive and well ____________________________ SOCIAL HISTORY Alcohol Use:  no alcohol use;  Smoking:  smokes less than 1 ppd, 30 pack year history;  Diet:  regular diet;  Lifestyle:  married;  Exercise:  no regular exercise;  Occupation:  Financial risk analyst;  Residence:  lives with wife;   ____________________________ REVIEW OF SYSTEMS General:  denies recent weight change, fatique or change in exercise tolerance. Eyes: denies diplopia, history of glaucoma or visual problems. Respiratory: denies dyspnea, cough, wheezing or hemoptysis. Cardiovascular:  please review HPI  Musculoskeletal:  denies arthritis, venous insufficiency, or muscle weakness Neurological:  dizziness  ____________________________ PHYSICAL EXAMINATION VITAL SIGNS  Blood Pressure:  110/60 Sitting, Right arm, regular cuff  , 114/64 Standing, Right arm and regular cuff   Pulse:  68/min. Weight:  155.00 lbs. Height:  68"BMI: 23  Constitutional:  pleasant middle [place] appearing male in NAD Skin:  macular discoloration on both anterior shins with hair loss Head:  normocephalic, normal hair pattern, no masses or tenderness Chest:  normal symmetry, clear to auscultation. Cardiac:  regular rhythm, normal S1 and S2, no S3 or S4, harsh grade 3/6 systolic murmur at aortic area radiating to neck, grade 1/6 diastolic murmur Peripheral Pulses:  femoral pulse(s) 2+, posterior tibial pulse(s) 2+ on the right, dorsalis pedis and posterior tibial pulses absent on the  left Neurological:  no gross motor or sensory deficits noted, affect appropriate, oriented x3. ____________________________ MOST RECENT  LIPID PANEL 09/16/14  CHOL TOTL 261 mg/dl, LDL 161 NM, HDL 39 mg/dl, TRIGLYCER 096 mg/dl, CHOL/HDL 6.7 (Calc)  ____________________________ IMPRESSIONS/PLAN 1. Moderate aortic regurgitation which is asymptomatic 2. Hypertension under excellent control 3. Hyperlipidemia under treatment 4. Peripheral vascular disease with stable claudication  Recommendations:  Discuss aortic valve disease. At the present time he is asymptomatic and just needs monitoring on an annual basis with an echocardiogram. He continues to need good blood pressure control. Because of his atherosclerotic peripheral vascular disease needs a high-intensity statin with a goal LDL of less than 100. Followup in one year with an echo and an EKG.  ____________________________ TODAYS ORDERS  1. Return Visit: 1 year  2. 12 Lead EKG: 1 year  3. 2D, color flow, doppler: 1 year                       ____________________________ Cardiology Physician:  Darden Palmer MD [facility]

## 2014-12-15 NOTE — Telephone Encounter (Signed)
Needs appt, f/u. And for the time being, cut BP med in half.   Take half of the prescribed dose

## 2014-12-16 NOTE — Telephone Encounter (Signed)
  PT INFORMED AND VERBALIZED UNDERSTANDING AND HAS APPOINTMENT 8/23

## 2014-12-26 ENCOUNTER — Encounter: Payer: Self-pay | Admitting: Medical

## 2014-12-30 ENCOUNTER — Ambulatory Visit (INDEPENDENT_AMBULATORY_CARE_PROVIDER_SITE_OTHER): Payer: Managed Care, Other (non HMO) | Admitting: Medical

## 2014-12-30 ENCOUNTER — Encounter: Payer: Self-pay | Admitting: Medical

## 2014-12-30 VITALS — BP 110/60 | HR 64 | Temp 98.0°F | Resp 18 | Wt 156.2 lb

## 2014-12-30 DIAGNOSIS — M542 Cervicalgia: Secondary | ICD-10-CM | POA: Diagnosis not present

## 2014-12-30 DIAGNOSIS — M79602 Pain in left arm: Secondary | ICD-10-CM | POA: Diagnosis not present

## 2014-12-30 DIAGNOSIS — R946 Abnormal results of thyroid function studies: Secondary | ICD-10-CM | POA: Diagnosis not present

## 2014-12-30 DIAGNOSIS — Z720 Tobacco use: Secondary | ICD-10-CM | POA: Diagnosis not present

## 2014-12-30 DIAGNOSIS — R202 Paresthesia of skin: Secondary | ICD-10-CM | POA: Diagnosis not present

## 2014-12-30 DIAGNOSIS — I351 Nonrheumatic aortic (valve) insufficiency: Secondary | ICD-10-CM

## 2014-12-30 DIAGNOSIS — I739 Peripheral vascular disease, unspecified: Secondary | ICD-10-CM | POA: Diagnosis not present

## 2014-12-30 DIAGNOSIS — E785 Hyperlipidemia, unspecified: Secondary | ICD-10-CM

## 2014-12-30 DIAGNOSIS — I10 Essential (primary) hypertension: Secondary | ICD-10-CM | POA: Diagnosis not present

## 2014-12-30 DIAGNOSIS — Z23 Encounter for immunization: Secondary | ICD-10-CM | POA: Diagnosis not present

## 2014-12-30 DIAGNOSIS — R7989 Other specified abnormal findings of blood chemistry: Secondary | ICD-10-CM | POA: Insufficient documentation

## 2014-12-30 LAB — HEPATIC FUNCTION PANEL
ALT: 27 U/L (ref 9–46)
AST: 20 U/L (ref 10–35)
Albumin: 4.4 g/dL (ref 3.6–5.1)
Alkaline Phosphatase: 60 U/L (ref 40–115)
Bilirubin, Direct: 0.1 mg/dL (ref <=0.2)
Indirect Bilirubin: 0.3 mg/dL (ref 0.2–1.2)
Total Bilirubin: 0.4 mg/dL (ref 0.2–1.2)
Total Protein: 7.1 g/dL (ref 6.1–8.1)

## 2014-12-30 LAB — BASIC METABOLIC PANEL
BUN: 10 mg/dL (ref 7–25)
CO2: 24 mmol/L (ref 20–31)
Calcium: 9.5 mg/dL (ref 8.6–10.3)
Chloride: 105 mmol/L (ref 98–110)
Creat: 0.61 mg/dL — ABNORMAL LOW (ref 0.70–1.33)
Glucose, Bld: 92 mg/dL (ref 65–99)
Potassium: 4.3 mmol/L (ref 3.5–5.3)
Sodium: 139 mmol/L (ref 135–146)

## 2014-12-30 LAB — LIPID PANEL
Cholesterol: 146 mg/dL (ref 125–200)
HDL: 38 mg/dL — ABNORMAL LOW (ref >=40)
LDL Cholesterol: 91 mg/dL (ref <130)
Total CHOL/HDL Ratio: 3.8 Ratio (ref <=5.0)
Triglycerides: 85 mg/dL (ref <150)
VLDL: 17 mg/dL (ref <30)

## 2014-12-30 LAB — TSH: TSH: 0.296 u[IU]/mL — ABNORMAL LOW (ref 0.350–4.500)

## 2014-12-30 LAB — T4, FREE: Free T4: 1.29 ng/dL (ref 0.80–1.80)

## 2014-12-30 MED ORDER — LISINOPRIL-HYDROCHLOROTHIAZIDE 10-12.5 MG PO TABS: 1.0000 | ORAL_TABLET | Freq: Every day | ORAL | Status: DC

## 2014-12-30 MED ORDER — NICOTINE 21 MG/24HR TD PT24: 21.0000 mg | MEDICATED_PATCH | Freq: Every day | TRANSDERMAL | Status: DC

## 2014-12-30 MED ORDER — ASPIRIN EC 81 MG PO TBEC: 81.0000 mg | DELAYED_RELEASE_TABLET | Freq: Every day | ORAL | Status: DC

## 2014-12-30 NOTE — Addendum Note (Signed)
Addended by: Blase Mess F on: 12/30/2014 10:07 AM   Modules accepted: Orders

## 2014-12-30 NOTE — Progress Notes (Signed)
Subjective: Here for f/u.   Since last visit he saw cardiology, Dr. Donnie Aho and at one point after last visit here went to the emergency dept due to taking double of BP medication causing dizziness and lightheadedness.   HTN - compliant with Lisinopril HCT 20/12.5mg  daily.  No current issues.  Hyperlipidemia - compliant with Crestor without compliant.   Still smoking, cutting back, trying to quit.    Abnormal liver tests - here for f/u  Abnormal thyroid tests - here for f/u  Weigh loss - has been working at [employer], has cut out some red meat, fried foods.  Eating more vegetables.  Exercising some.   Impaired fasting glucose - working on lifestyle changes  Having some problems with left arm.   On 12/12/14 was taking shower, went to reach over from left arm to right shoulder, and felt pain in neck and arm, felt wry neck.   Since then having some pains in left upper arm.  Occasional neck pain, occasional tingling in left upper arm.   Past Medical History  Diagnosis Date  . Peripheral vascular disease   . Dizziness and giddiness   . Back pain   . Loss of balance   . Hypertension   . Aortic valve insufficiency 12/15/2014    Moderate by ECHO 12/15/14 normal LV function    ROS as in subjective   Objective: BP 110/60 mmHg  Pulse 64  Temp(Src) 98 F (36.7 C)  Resp 18  Wt 156 lb 3.2 oz (70.852 kg)  Wt Readings from Last 3 Encounters:  12/30/14 156 lb 3.2 oz (70.852 kg)  09/25/14 166 lb 9.6 oz (75.569 kg)  09/23/14 169 lb (76.658 kg)   BP Readings from Last 3 Encounters:  12/30/14 110/60  09/26/14 117/89  09/23/14 130/70   General appearance: alert, no distress, WD/WN Neck: supple, no lymphadenopathy, no thyromegaly, no masses, no bruits, non tender, normal ROM Heart: 2/6 systolic murmur heard best in left lower sternal border, otherwise RRR, normal S1, S2 Lungs: CTA bilaterally, no wheezes, rhonchi, or rales Abdomen: +bs, soft, non tender, non distended, no masses, no  hepatomegaly, no splenomegaly, no bruits Extremities: no edema, no cyanosis, no clubbing Pulses: absent left pedal pulses, otherwise 2+ symmetric, upper and lower extremities, normal cap refill Neurological: alert, oriented x 3, CN2-12 intact, strength normal upper extremities and lower extremities, sensation normal throughout, DTRs 2+ throughout, no cerebellar signs, gait normal MSK: mild tenderness left upper arm.  No deformity, normal ROM, negative special tests.     Assessment: Encounter Diagnoses  Name Primary?  . Essential hypertension Yes  . Aortic valve insufficiency   . Hyperlipidemia   . Peripheral vascular disease with claudication   . Tobacco abuse   . Abnormal thyroid blood test   . Need for prophylactic vaccination and inoculation against influenza   . Need for prophylactic vaccination against Streptococcus pneumoniae (pneumococcus)   . Neck pain   . Arm pain, left   . Paresthesia     Plan: HTN - change to lower dose today given lower readings and weight loss.     Aortic valve insuffiencey - reviewed Dr. York Spaniel recent evaluation, he will need repeat Echo and EKG in 1 year.  Hyperlipidemia - c/t crestor, labs today  PVD - advised smoking cessation, ongoing medications for BP, lipids, aspirin daily QHS.  Tobacco use - c/t efforts to stop tobacco.   Abnormal thyroid test - repeat labs today  Impaired glucose - repeat labs today  Counseled  on the influenza virus vaccine.  Vaccine information sheet given.  Influenza vaccine given after consent obtained.  Counseled on the pneumococcal vaccine.  Vaccine information sheet given.  Pneumococcal vaccine given after consent obtained.  Counseled on getting colonoscopy.  He will read over the info and let me know.   Arm pain - likely bulging disc in neck.   Await labs, consider NSAID, relative rest.   F/u pending labs

## 2014-12-30 NOTE — Patient Instructions (Signed)
Encounter Diagnoses  Name Primary?  . Essential hypertension Yes  . Aortic valve insufficiency   . Hyperlipidemia   . Peripheral vascular disease with claudication   . Tobacco abuse   . Abnormal thyroid blood test   . Need for prophylactic vaccination and inoculation against influenza   . Need for prophylactic vaccination against Streptococcus pneumoniae (pneumococcus)   . Neck pain   . Arm pain, left   . Paresthesia     Recommendations: Hypertension - change to lower dose Lisinopril HCT 10/12.5mg  daily  Continue crestor and aspirin at night for cholesterol  Continue healthy diet and exercise  Call 1-800-QUIT-NOW for help with smoking cessation counseling  Consider colonoscopy cancer screen.  Arm pain - likely a bulging disc in the neck.  For the next week, use an over the counter arm sling for an hour at a time, and do this at various times throughout the day to rest the left arm.   Begin OTC Aleve twice daily for pain and inflammation  We will call with lab results .   YOU CAN QUIT SMOKING!  Talk to your medical provider about using medicines to help you quit. These include nicotine replacement gum, lozenges, or skin patches.  Consider calling 1-800-QUIT-NOW, a toll free 24/7 hotline with free counseling to help you quit.  If you are ready to quit smoking or are thinking about it, congratulations! You have chosen to help yourself be healthier and live longer! There are lots of different ways to quit smoking. Nicotine gum, nicotine patches, a nicotine inhaler, or nicotine nasal spray can help with physical craving. Hypnosis, support groups, and medicines help break the habit of smoking. TIPS TO GET OFF AND STAY OFF CIGARETTES  Learn to predict your moods. Do not let a bad situation be your excuse to have a cigarette. Some situations in your life might tempt you to have a cigarette.   Ask friends and co-workers not to smoke around you.   Make your home smoke-free.   Never  have "just one" cigarette. It leads to wanting another and another. Remind yourself of your decision to quit.   On a card, make a list of your reasons for not smoking. Read it at least the same number of times a day as you have a cigarette. Tell yourself everyday, "I do not want to smoke. I choose not to smoke."   Ask someone at home or work to help you with your plan to quit smoking.   Have something planned after you eat or have a cup of coffee. Take a walk or get other exercise to perk you up. This will help to keep you from overeating.   Try a relaxation exercise to calm you down and decrease your stress. Remember, you may be tense and nervous the first two weeks after you quit. This will pass.   Find new activities to keep your hands busy. Play with a pen, coin, or rubber band. Doodle or draw things on paper.   Brush your teeth right after eating. This will help cut down the craving for the taste of tobacco after meals. You can try mouthwash too.   Try gum, breath mints, or diet candy to keep something in your mouth.  IF YOU SMOKE AND WANT TO QUIT:  Do not stock up on cigarettes. Never buy a carton. Wait until one pack is finished before you buy another.   Never carry cigarettes with you at work or at home.   Keep  cigarettes as far away from you as possible. Leave them with someone else.   Never carry matches or a lighter with you.   Ask yourself, "Do I need this cigarette or is this just a reflex?"   Bet with someone that you can quit. Put cigarette money in a piggy bank every morning. If you smoke, you give up the money. If you do not smoke, by the end of the week, you keep the money.   Keep trying. It takes 21 days to change a habit!  Document Released: 02/19/2009 Document Revised: 01/05/2011 Document Reviewed: 02/19/2009 [facility] Patient Information 2012 [place], [place].   Colonoscopy A colonoscopy is an exam to look at the entire large intestine (colon). This exam can  help find problems such as tumors, polyps, inflammation, and areas of bleeding. The exam takes about 1 hour.  LET [facility] CARE PROVIDER KNOW ABOUT:   Any allergies you have.  All medicines you are taking, including vitamins, herbs, eye drops, creams, and over-the-counter medicines.  Previous problems you or members of your family have had with the use of anesthetics.  Any blood disorders you have.  Previous surgeries you have had.  Medical conditions you have. RISKS AND COMPLICATIONS  Generally, this is a safe procedure. However, as with any procedure, complications can occur. Possible complications include:  Bleeding.  Tearing or rupture of the colon wall.  Reaction to medicines given during the exam.  Infection (rare). BEFORE THE PROCEDURE   Ask your health care provider about changing or stopping your regular medicines.  You may be prescribed an oral bowel prep. This involves drinking a large amount of medicated liquid, starting the day before your procedure. The liquid will cause you to have multiple loose stools until your stool is almost clear or light green. This cleans out your colon in preparation for the procedure.  Do not eat or drink anything else once you have started the bowel prep, unless your health care provider tells you it is safe to do so.  Arrange for someone to drive you home after the procedure. PROCEDURE   You will be given medicine to help you relax (sedative).  You will lie on your side with your knees bent.  A long, flexible tube with a light and camera on the end (colonoscope) will be inserted through the rectum and into the colon. The camera sends video back to a computer screen as it moves through the colon. The colonoscope also releases carbon dioxide gas to inflate the colon. This helps your health care provider see the area better.  During the exam, your health care provider may take a small tissue sample (biopsy) to be examined under a  microscope if any abnormalities are found.  The exam is finished when the entire colon has been viewed. AFTER THE PROCEDURE   Do not drive for 24 hours after the exam.  You may have a small amount of blood in your stool.  You may pass moderate amounts of gas and have mild abdominal cramping or bloating. This is caused by the gas used to inflate your colon during the exam.  Ask when your test results will be ready and how you will get your results. Make sure you get your test results. Document Released: 04/22/2000 Document Revised: 02/13/2013 Document Reviewed: 12/31/2012 [facility] Patient Information 2015 [place], [place]. This information is not intended to replace advice given to you by your health care provider. Make sure you discuss any questions you have with your  health care provider.

## 2015-01-01 ENCOUNTER — Other Ambulatory Visit: Payer: Self-pay | Admitting: Medical

## 2015-01-01 MED ORDER — CYCLOBENZAPRINE HCL 10 MG PO TABS: ORAL_TABLET | ORAL | Status: DC

## 2015-01-01 MED ORDER — ROSUVASTATIN CALCIUM 20 MG PO TABS: 20.0000 mg | ORAL_TABLET | Freq: Every day | ORAL | Status: DC

## 2015-01-01 MED ORDER — NAPROXEN 500 MG PO TABS: 500.0000 mg | ORAL_TABLET | Freq: Two times a day (BID) | ORAL | Status: DC

## 2015-01-15 ENCOUNTER — Ambulatory Visit: Payer: Self-pay | Admitting: Medical

## 2015-01-20 ENCOUNTER — Ambulatory Visit (INDEPENDENT_AMBULATORY_CARE_PROVIDER_SITE_OTHER): Payer: Managed Care, Other (non HMO) | Admitting: Medical

## 2015-01-20 ENCOUNTER — Encounter: Payer: Self-pay | Admitting: Medical

## 2015-01-20 VITALS — BP 110/68 | HR 20 | Temp 98.0°F | Resp 18 | Wt 159.4 lb

## 2015-01-20 DIAGNOSIS — M79605 Pain in left leg: Secondary | ICD-10-CM

## 2015-01-20 DIAGNOSIS — I739 Peripheral vascular disease, unspecified: Secondary | ICD-10-CM | POA: Diagnosis not present

## 2015-01-20 DIAGNOSIS — M79602 Pain in left arm: Secondary | ICD-10-CM | POA: Diagnosis not present

## 2015-01-20 DIAGNOSIS — Z720 Tobacco use: Secondary | ICD-10-CM

## 2015-01-20 DIAGNOSIS — R202 Paresthesia of skin: Secondary | ICD-10-CM | POA: Diagnosis not present

## 2015-01-20 DIAGNOSIS — G5602 Carpal tunnel syndrome, left upper limb: Secondary | ICD-10-CM

## 2015-01-20 DIAGNOSIS — E785 Hyperlipidemia, unspecified: Secondary | ICD-10-CM | POA: Diagnosis not present

## 2015-01-20 DIAGNOSIS — I10 Essential (primary) hypertension: Secondary | ICD-10-CM | POA: Diagnosis not present

## 2015-01-20 DIAGNOSIS — R946 Abnormal results of thyroid function studies: Secondary | ICD-10-CM | POA: Diagnosis not present

## 2015-01-20 DIAGNOSIS — R7989 Other specified abnormal findings of blood chemistry: Secondary | ICD-10-CM

## 2015-01-20 MED ORDER — NAPROXEN 500 MG PO TABS: 500.0000 mg | ORAL_TABLET | Freq: Two times a day (BID) | ORAL | Status: DC

## 2015-01-20 NOTE — Progress Notes (Signed)
   Subjective: At last visit recently he was having some problems with left arm.   On 12/12/14 was taking shower, went to reach over from left arm to right shoulder, and felt pain in neck and arm, felt wry neck.   Since then had been having some pains in left upper arm.  Occasional neck pain, occasional tingling in left upper arm.   Since last visit no pain now, but left hand along thumb and index finger is numb intermittently, sometime worse if leaning on left elbow in a chair.   No other paresthesias.     Since last visit he continues efforts to stop tobacco.  He notes BPs are running good now, not too low.   No other c/o  Past Medical History  Diagnosis Date  . Peripheral vascular disease   . Dizziness and giddiness   . Back pain   . Loss of balance   . Hypertension   . Aortic valve insufficiency 12/15/2014    Moderate by ECHO 12/15/14 normal LV function    ROS as in subjective   Objective: BP 110/68 mmHg  Pulse 20  Temp(Src) 98 F (36.7 C) (Oral)  Resp 18  Wt 159 lb 6.4 oz (72.303 kg)  General appearance: alert, no distress, WD/WN Neck: supple, no lymphadenopathy, no thyromegaly, no masses, no bruits, non tender, normal ROM Abdomen: +bs, soft, non tender, non distended, no masses, no hepatomegaly, no splenomegaly, no bruits Extremities: no edema, no cyanosis, no clubbing Pulses: absent left pedal pulses, otherwise 2+ symmetric, upper and lower extremities, normal cap refill Neurological:+phalens on the left, dullness of sharp and light touch to entire left hand and wrist in glove distribution, otherwise normal strength sensation and DTRs MSK: left arm nontender today,no deformity, normal ROM, negative special tests.     Assessment: Encounter Diagnoses  Name Primary?  . 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 Left hand paresthesia   . Arm pain, diffuse, left   . Essential hypertension   . Abnormal thyroid blood test   . Hyperlipidemia   . Peripheral vascular disease  with claudication   . Tobacco abuse      Plan: CTS, paresthesias left hand - script given for CTS splint, use QHS, c/t Naprosyn, avoid leaning on elbow, and recheck 42mo  Arm pain resolved  HTN -c/t current medication, compliant  Hyperlipidemia - c/t crestor, much improved on labs from last visit, discussed findings  PVD - advised smoking cessation, ongoing medications for BP, lipids, aspirin daily QHS.  Tobacco use - c/t efforts to stop tobacco.   Abnormal thyroid test - repeat labs in 19mo  Counseled on getting colonoscopy.  He is still contemplating  F/u 42mo on CTS

## 2015-04-29 ENCOUNTER — Encounter: Payer: Self-pay | Admitting: Medical

## 2015-04-29 ENCOUNTER — Ambulatory Visit (INDEPENDENT_AMBULATORY_CARE_PROVIDER_SITE_OTHER): Payer: Managed Care, Other (non HMO) | Admitting: Medical

## 2015-04-29 VITALS — BP 110/58 | HR 82 | Wt 161.0 lb

## 2015-04-29 DIAGNOSIS — R0789 Other chest pain: Secondary | ICD-10-CM

## 2015-04-29 DIAGNOSIS — Z720 Tobacco use: Secondary | ICD-10-CM

## 2015-04-29 DIAGNOSIS — F17200 Nicotine dependence, unspecified, uncomplicated: Secondary | ICD-10-CM

## 2015-04-29 DIAGNOSIS — I10 Essential (primary) hypertension: Secondary | ICD-10-CM

## 2015-04-29 LAB — CBC WITH DIFFERENTIAL/PLATELET
BASOS ABS: 0 10*3/uL (ref 0.0–0.1)
Basophils Relative: 0 % (ref 0–1)
EOS ABS: 0.4 10*3/uL (ref 0.0–0.7)
Eosinophils Relative: 3 % (ref 0–5)
HCT: 44 % (ref 39.0–52.0)
HEMOGLOBIN: 15.2 g/dL (ref 13.0–17.0)
LYMPHS ABS: 3.8 10*3/uL (ref 0.7–4.0)
Lymphocytes Relative: 31 % (ref 12–46)
MCH: 32.6 pg (ref 26.0–34.0)
MCHC: 34.5 g/dL (ref 30.0–36.0)
MCV: 94.4 fL (ref 78.0–100.0)
MPV: 10.5 fL (ref 8.6–12.4)
Monocytes Absolute: 0.7 10*3/uL (ref 0.1–1.0)
Monocytes Relative: 6 % (ref 3–12)
NEUTROS ABS: 7.3 10*3/uL (ref 1.7–7.7)
NEUTROS PCT: 60 % (ref 43–77)
Platelets: 250 10*3/uL (ref 150–400)
RBC: 4.66 MIL/uL (ref 4.22–5.81)
RDW: 13.2 % (ref 11.5–15.5)
WBC: 12.2 10*3/uL — ABNORMAL HIGH (ref 4.0–10.5)

## 2015-04-29 MED ORDER — LISINOPRIL 10 MG PO TABS: 10.0000 mg | ORAL_TABLET | Freq: Every day | ORAL | Status: DC

## 2015-04-29 NOTE — Progress Notes (Signed)
Subjective: Chief Complaint  Patient presents with  . rib pain    started on the lt side and then changed to the rt side. said he felt it all night cyclobenzoprine he took last night and today no pain. when he had the pains and said his blood pressure goes low. it was 108/68, the pharmacist said that it was to low, normally here it is 110/68   Here for pains.  He notes over the last 2 weeks having some random pains.  initially felt in left chest, then a week later pain in right chest, then next day right upper back.    All the pains seem to be in the chest wall.   Denies fall, trauma, or injury.   He did use some flexeril which seemed to help.   He denies night sweats, weight loss, fevers, hemoptysis.   He does have cough from time to time.   He is a smoker, still trying to cut down.     He is compliant with BP medication but he notes lately BP always seems to be running on the low side.   At pharmacy the other day had low 100 SBP, <70 DBP routinley.  No other aggravating or relieving factors. No other complaint.  Past Medical History  Diagnosis Date  . Peripheral vascular disease (HCC)   . Dizziness and giddiness   . Back pain   . Loss of balance   . Hypertension   . Aortic valve insufficiency 12/15/2014    Moderate by ECHO 12/15/14 normal LV function     Objective: BP 110/58 mmHg  Pulse 82  Wt 161 lb (73.029 kg)  Wt Readings from Last 3 Encounters:  04/29/15 161 lb (73.029 kg)  01/20/15 159 lb 6.4 oz (72.303 kg)  12/30/14 156 lb 3.2 oz (70.852 kg)   BP Readings from Last 3 Encounters:  04/29/15 110/58  01/20/15 110/68  12/30/14 110/60   General appearance: alert, no distress, WD/WN Neck: supple, no lymphadenopathy, no thyromegaly, no masses, normal ROM Heart: Heart: 2/6 systolic murmur heard best in left lower sternal border, otherwise RRR, normal S1, S2 Lungs: CTA bilaterally, no wheezes, rhonchi, or rales Chest wall - no obvious tenderness or deformity Abdomen: +bs, soft, non  tender, non distended, no masses, no hepatomegaly, no splenomegaly Ext: no edema MSK: arms nontender, no deformity, normal ROM    Assessment: Encounter Diagnoses  Name Primary?  . Chest wall pain Yes  . Smoker   . Essential hypertension     Plan: Chest wall pain, smoker - go for Chest Xray.   We will call with lab results. If normal xray, may just be musculoskeletal chest wall pain and can treat with relative rest, Tylenol.  HTN -due to relative low readings of recent, change to Lisinopril 10mg  daily.  Stop Lisinopril HCT.

## 2015-04-29 NOTE — Patient Instructions (Addendum)
Encounter Diagnoses  Name Primary?  . Chest wall pain Yes  . Smoker   . Essential hypertension    Recommendations:  Go for chest xray between 8am and 4pm at [facility]  We will call tomorrow with lab results  Since your blood pressure is running low, start using 1/2 tablet Lisinopril HCT daily until you run out of this  After you run out of the Lisinopril HCT 10/12.5mg , then switch to just plain Lisinopril 10mg  daily  Check your blood pressure periodically.   The goal is 120/70.

## 2015-04-30 ENCOUNTER — Other Ambulatory Visit: Payer: Self-pay | Admitting: Medical

## 2015-04-30 DIAGNOSIS — R0789 Other chest pain: Secondary | ICD-10-CM

## 2015-04-30 DIAGNOSIS — F17200 Nicotine dependence, unspecified, uncomplicated: Secondary | ICD-10-CM

## 2015-04-30 LAB — SEDIMENTATION RATE: SED RATE: 4 mm/h (ref 0–20)

## 2015-05-01 ENCOUNTER — Ambulatory Visit
Admission: RE | Admit: 2015-05-01 | Discharge: 2015-05-01 | Disposition: A | Discharge status: Home / Self Care | Payer: Managed Care, Other (non HMO) | Source: Ambulatory Visit | Attending: Medical | Admitting: Medical

## 2015-05-01 ENCOUNTER — Other Ambulatory Visit: Payer: Self-pay | Admitting: Medical

## 2015-05-01 DIAGNOSIS — R0789 Other chest pain: Secondary | ICD-10-CM

## 2015-05-01 DIAGNOSIS — I10 Essential (primary) hypertension: Secondary | ICD-10-CM

## 2015-05-01 DIAGNOSIS — F17200 Nicotine dependence, unspecified, uncomplicated: Secondary | ICD-10-CM

## 2015-10-08 ENCOUNTER — Telehealth: Payer: Self-pay | Admitting: Medical

## 2015-10-08 NOTE — Telephone Encounter (Signed)
Pt needs refills Lisinopril & Crestor to [facility] [address]  Pt is confused on his dosage of Lisinopril, please verify dosage & advise pt

## 2015-10-09 ENCOUNTER — Other Ambulatory Visit: Payer: Self-pay | Admitting: Medical

## 2015-10-09 MED ORDER — ROSUVASTATIN CALCIUM 20 MG PO TABS: 20.0000 mg | ORAL_TABLET | Freq: Every day | ORAL | Status: DC

## 2015-10-09 MED ORDER — ASPIRIN EC 81 MG PO TBEC: 81.0000 mg | DELAYED_RELEASE_TABLET | Freq: Every day | ORAL | Status: DC

## 2015-10-09 MED ORDER — LISINOPRIL 10 MG PO TABS: 10.0000 mg | ORAL_TABLET | Freq: Every day | ORAL | Status: DC

## 2015-10-09 NOTE — Telephone Encounter (Signed)
Appt made for CPE & gave info on dosage, Pt also needs refill on Aspirin to [facility] [address]

## 2015-10-09 NOTE — Telephone Encounter (Signed)
I refilled what he should be on.  Set him up for med check or physical.  I believe he is due for physical or recheck within the next month or 2. Check date of last CPX.

## 2015-10-09 NOTE — Telephone Encounter (Signed)
done

## 2015-10-09 NOTE — Telephone Encounter (Signed)
pls send the aspirin

## 2015-10-12 ENCOUNTER — Telehealth: Payer: Self-pay

## 2015-10-12 NOTE — Telephone Encounter (Signed)
Pt came in today confused about medications. He stated Vincenza HewsShane told him he could take 1/2 of the Lisinopril HCTZ tablet, and once he ran out switch to plain Lisinopril. No documentation on that, but after discussing with Vincenza HewsShane he stated that if pts BP today was good then tell him he can finish out the Lisinopril HCTZ 1/2 tablet daily and once he runs out start taking the new RX for the Lisinopril plain. Pt stated he understood and his BP today was 118/78. Pt will check his BP at home once starting the plain Lisinopril to make sure it is not making his pressure to low and call us with any problems or concerns

## 2015-12-09 ENCOUNTER — Encounter: Payer: Managed Care, Other (non HMO) | Admitting: Medical

## 2016-01-04 ENCOUNTER — Other Ambulatory Visit: Payer: Self-pay | Admitting: Medical

## 2016-01-14 ENCOUNTER — Encounter: Payer: Managed Care, Other (non HMO) | Admitting: Medical

## 2016-04-13 ENCOUNTER — Encounter: Payer: Self-pay | Admitting: Medical

## 2016-04-13 ENCOUNTER — Ambulatory Visit (INDEPENDENT_AMBULATORY_CARE_PROVIDER_SITE_OTHER): Payer: Managed Care, Other (non HMO) | Admitting: Medical

## 2016-04-13 ENCOUNTER — Ambulatory Visit: Payer: Managed Care, Other (non HMO) | Admitting: Family Medicine

## 2016-04-13 ENCOUNTER — Other Ambulatory Visit: Payer: Self-pay | Admitting: Medical

## 2016-04-13 VITALS — BP 130/70 | HR 73 | Wt 162.6 lb

## 2016-04-13 DIAGNOSIS — R079 Chest pain, unspecified: Secondary | ICD-10-CM | POA: Diagnosis not present

## 2016-04-13 DIAGNOSIS — R946 Abnormal results of thyroid function studies: Secondary | ICD-10-CM | POA: Diagnosis not present

## 2016-04-13 DIAGNOSIS — Z720 Tobacco use: Secondary | ICD-10-CM

## 2016-04-13 DIAGNOSIS — I351 Nonrheumatic aortic (valve) insufficiency: Secondary | ICD-10-CM | POA: Diagnosis not present

## 2016-04-13 DIAGNOSIS — I10 Essential (primary) hypertension: Secondary | ICD-10-CM | POA: Diagnosis not present

## 2016-04-13 DIAGNOSIS — I739 Peripheral vascular disease, unspecified: Secondary | ICD-10-CM

## 2016-04-13 DIAGNOSIS — E785 Hyperlipidemia, unspecified: Secondary | ICD-10-CM

## 2016-04-13 DIAGNOSIS — R7989 Other specified abnormal findings of blood chemistry: Secondary | ICD-10-CM

## 2016-04-13 LAB — COMPREHENSIVE METABOLIC PANEL
ALK PHOS: 61 U/L (ref 40–115)
ALT: 26 U/L (ref 9–46)
AST: 20 U/L (ref 10–35)
Albumin: 4.7 g/dL (ref 3.6–5.1)
BUN: 12 mg/dL (ref 7–25)
CALCIUM: 9.6 mg/dL (ref 8.6–10.3)
CO2: 22 mmol/L (ref 20–31)
Chloride: 102 mmol/L (ref 98–110)
Creat: 0.78 mg/dL (ref 0.70–1.33)
Glucose, Bld: 93 mg/dL (ref 65–99)
POTASSIUM: 4.2 mmol/L (ref 3.5–5.3)
Sodium: 137 mmol/L (ref 135–146)
TOTAL PROTEIN: 7.8 g/dL (ref 6.1–8.1)
Total Bilirubin: 0.7 mg/dL (ref 0.2–1.2)

## 2016-04-13 LAB — TSH: TSH: 0.36 m[IU]/L — AB (ref 0.40–4.50)

## 2016-04-13 LAB — LIPID PANEL
CHOLESTEROL: 176 mg/dL (ref <200)
HDL: 41 mg/dL (ref >40)
LDL CALC: 117 mg/dL — AB (ref <100)
TRIGLYCERIDES: 90 mg/dL (ref <150)
Total CHOL/HDL Ratio: 4.3 Ratio (ref <5.0)
VLDL: 18 mg/dL (ref <30)

## 2016-04-13 LAB — CBC
HCT: 46.2 % (ref 38.5–50.0)
HEMOGLOBIN: 15.8 g/dL (ref 13.2–17.1)
MCH: 32 pg (ref 27.0–33.0)
MCHC: 34.2 g/dL (ref 32.0–36.0)
MCV: 93.5 fL (ref 80.0–100.0)
MPV: 10.1 fL (ref 7.5–12.5)
PLATELETS: 263 10*3/uL (ref 140–400)
RBC: 4.94 MIL/uL (ref 4.20–5.80)
RDW: 13.1 % (ref 11.0–15.0)
WBC: 8.7 10*3/uL (ref 4.0–10.5)

## 2016-04-13 LAB — T4, FREE: Free T4: 1.7 ng/dL (ref 0.8–1.8)

## 2016-04-13 MED ORDER — NAPROXEN 500 MG PO TABS: 500.0000 mg | ORAL_TABLET | Freq: Two times a day (BID) | ORAL | 0 refills | Status: DC

## 2016-04-13 NOTE — Addendum Note (Signed)
Addended by: Jac CanavanYSINGER, DAVID S on: 04/13/2016 07:36 PM   Modules accepted: Orders

## 2016-04-13 NOTE — Progress Notes (Signed)
Subjective: Chief Complaint  Patient presents with  . chest pain    chest pain x 8 days    He notes last week was having some pains in central and left chest.  He did have 2 times last week when he lifted a heavy garbage door.  His garage opener wasn't opening, so he had to lift the garage door.   Sometimes pains left lateral chest.   Pain lasts for seconds.  No associated SOB, dizziness, paresthesia.  He does have some chest wall pain.  In general these pains are intermittent in the last 8 days, and are brief.     [place] went [place] of the country [place] 3 months.  Was with family when father passed away recently.  He had complications from surgery, infection.  He lost his statin bottle while out of the country.  Missed medication for 45 days.  Otherwise he has been exercising, trying to eat healthy.  Still smokes, but trying to cut back.  Last visit with cardiology Dr. Donnie Ahoilley about a year ago.    Past Medical History:  Diagnosis Date  . Aortic valve insufficiency 12/15/2014   Moderate by ECHO 12/15/14 normal LV function   . Back pain   . Dizziness and giddiness   . Hypertension   . Loss of balance   . Peripheral vascular disease [facility](HCC)    Current Outpatient Prescriptions on File Prior to Visit  Medication Sig Dispense Refill  . aspirin 81 MG EC tablet TAKE 1 TABLET (81 MG TOTAL) BY MOUTH DAILY. 90 tablet 0  . lisinopril (PRINIVIL,ZESTRIL) 10 MG tablet TAKE 1 TABLET BY MOUTH EVERY DAY 90 tablet 0  . rosuvastatin (CRESTOR) 20 MG tablet TAKE 1 TABLET BY MOUTH EVERY DAY 90 tablet 0   No current facility-administered medications on file prior to visit.    No past surgical history on file.  ROS as in subjective  Objective: BP 130/70   Pulse 73   Wt 162 lb 9.6 oz (73.8 kg)   SpO2 99%   BMI 24.72 kg/m   Wt Readings from Last 3 Encounters:  04/13/16 162 lb 9.6 oz (73.8 kg)  04/29/15 161 lb (73 kg)  01/20/15 159 lb 6.4 oz (72.3 kg)    General appearance: alert, no distress, WD/WN,  Neck:  supple, no lymphadenopathy, no thyromegaly, no masses, no bruits Heart: 3/6 holosystolic murmur heard throughout, otherwise RRR, normal S1, S2 Lungs: CTA bilaterally, no wheezes, rhonchi, or rales Abdomen: +bs, soft, non tender, non distended, no masses, no hepatomegaly, no splenomegaly Pulses: 1+ symmetric, upper and lower extremities, normal cap refill No edema Neuro: nonfocal exam   Adult ECG Report  Indication: chest pain  Rate: 77 bpm  Rhythm: normal sinus rhythm  QRS Axis: 56 degrees  PR Interval: 160ms  QRS Duration: 96ms  QTc: 414ms  Conduction Disturbances: none  Other Abnormalities: none  Patient's cardiac risk factors are: dyslipidemia, hypertension, male gender and smoking/ tobacco exposure.  EKG comparison: 09/2014   Narrative Interpretation: no acute changes    Assessment: Encounter Diagnoses  Name Primary?  . Chest pain, unspecified type Yes  . Aortic valve insufficiency, etiology of cardiac valve disease unspecified   . Essential hypertension   . Peripheral vascular disease with claudication (HCC)   . Tobacco abuse   . Hyperlipidemia, unspecified hyperlipidemia type     Plan: Chest pain appears to be musculoskeletal in nature.   Can use short term naprosyn, heat pad, stretching as discussed.  Aortic valve disease,  HTN , PVD - f/u with Dr. Donnie Ahoilley as he is due for yearly f/u  Labs today  Advised smoking cessation, exercise, healthy diet  expresses sympathy for the loss of his father recently.  Terry Gross was seen today for chest pain.  Diagnoses and all orders for this visit:  Chest pain, unspecified type -     Comprehensive metabolic panel -     CBC -     TSH  Aortic valve insufficiency, etiology of cardiac valve disease unspecified -     Comprehensive metabolic panel  Essential hypertension -     Comprehensive metabolic panel -     Hemoglobin A1c  Peripheral vascular disease with claudication (HCC) -     Lipid panel -     Hemoglobin  A1c  Tobacco abuse -     Hemoglobin A1c  Hyperlipidemia, unspecified hyperlipidemia type -     Lipid panel -     Hemoglobin A1c  Abnormal thyroid blood test -     TSH -     T4, free  Other orders -     naproxen (NAPROSYN) 500 MG tablet; Take 1 tablet (500 mg total) by mouth 2 (two) times daily with a meal.

## 2016-04-14 ENCOUNTER — Other Ambulatory Visit: Payer: Self-pay | Admitting: Medical

## 2016-04-14 DIAGNOSIS — R946 Abnormal results of thyroid function studies: Secondary | ICD-10-CM

## 2016-04-14 LAB — HEMOGLOBIN A1C
HEMOGLOBIN A1C: 5.6 % (ref <5.7)
Mean Plasma Glucose: 114 mg/dL

## 2016-04-14 MED ORDER — ROSUVASTATIN CALCIUM 40 MG PO TABS: 40.0000 mg | ORAL_TABLET | Freq: Every day | ORAL | 1 refills | Status: DC

## 2016-04-14 MED ORDER — LISINOPRIL 10 MG PO TABS: 10.0000 mg | ORAL_TABLET | Freq: Every day | ORAL | 3 refills | Status: DC

## 2016-04-14 MED ORDER — ASPIRIN 81 MG PO TBEC: 81.0000 mg | DELAYED_RELEASE_TABLET | Freq: Every day | ORAL | 3 refills | Status: DC

## 2016-04-15 ENCOUNTER — Encounter: Payer: Self-pay | Admitting: Internal Medicine

## 2016-04-21 ENCOUNTER — Ambulatory Visit: Payer: Managed Care, Other (non HMO) | Admitting: Endocrinology

## 2016-04-24 ENCOUNTER — Other Ambulatory Visit: Payer: Self-pay | Admitting: Medical

## 2016-04-25 NOTE — Telephone Encounter (Signed)
Can he have a refill on this. 

## 2016-04-26 NOTE — Telephone Encounter (Signed)
okay

## 2016-05-19 ENCOUNTER — Encounter: Payer: Self-pay | Admitting: Medical

## 2016-05-23 ENCOUNTER — Ambulatory Visit: Payer: Managed Care, Other (non HMO) | Admitting: Endocrinology

## 2016-06-02 ENCOUNTER — Ambulatory Visit (INDEPENDENT_AMBULATORY_CARE_PROVIDER_SITE_OTHER): Payer: Self-pay | Admitting: Endocrinology

## 2016-06-02 ENCOUNTER — Encounter: Payer: Self-pay | Admitting: Endocrinology

## 2016-06-02 VITALS — BP 118/70 | HR 67 | Ht 68.0 in | Wt 158.0 lb

## 2016-06-02 DIAGNOSIS — R7989 Other specified abnormal findings of blood chemistry: Secondary | ICD-10-CM

## 2016-06-02 DIAGNOSIS — R946 Abnormal results of thyroid function studies: Secondary | ICD-10-CM

## 2016-06-02 LAB — TSH: TSH: 0.28 u[IU]/mL — AB (ref 0.35–4.50)

## 2016-06-02 LAB — T4, FREE: FREE T4: 1.24 ng/dL (ref 0.60–1.60)

## 2016-06-02 LAB — T3, FREE: T3 FREE: 3.2 pg/mL (ref 2.3–4.2)

## 2016-06-02 NOTE — Progress Notes (Signed)
Patient ID: Terry Gross, male   DOB: [birth year], 51 y.o.   MRN: [id number]            Referring physician: Kristian Covey  Chief complaint: Abnormal thyroid test  History of Present Illness:  He has been referred here for persistently abnormal TSH tests Not clear why he has had thyroid levels drawn in the past, probably had a routine TSH test in 09/2014 which was abnormal  He does not complain of any palpitations, fatigue, shakiness, heat intolerance or recent weight change About 2 years ago he had tried to lose weight because of cardiovascular problems and he has been maintained his weight recently  Wt Readings from Last 3 Encounters:  06/02/16 158 lb (71.7 kg)  04/13/16 162 lb 9.6 oz (73.8 kg)  04/29/15 161 lb (73 kg)   His free T4 levels have been variable but recently relatively high   Lab Results  Component Value Date   TSH 0.36 (L) 04/13/2016   TSH 0.296 (L) 12/30/2014   TSH 0.239 (L) 09/23/2014   FREET4 1.7 04/13/2016   FREET4 1.29 12/30/2014   FREET4 1.46 09/23/2014     Past Medical History:  Diagnosis Date  . Aortic valve insufficiency 12/15/2014   Moderate by ECHO 12/15/14 normal LV function   . Back pain   . Dizziness and giddiness   . Hypertension   . Loss of balance   . Peripheral vascular disease (HCC)     History reviewed. No pertinent surgical history.  Family History  Problem Relation Age of Onset  . CVA Mother   . Thyroid disease Neg Hx     Social History:  reports that he has been smoking Cigarettes.  He has a 15.00 pack-year smoking history. He has never used smokeless tobacco. He reports that he drinks about 3.6 oz of alcohol per week . He reports that he does not use drugs.  Allergies: No Known Allergies  Allergies as of 06/02/2016   No Known Allergies     Medication List       Accurate as of 06/02/16 11:02 AM. Always use your most recent med list.          aspirin 81 MG EC tablet Take 1 tablet (81 mg total) by mouth daily.   lisinopril 10 MG tablet Commonly known as:  PRINIVIL,ZESTRIL Take 1 tablet (10 mg total) by mouth daily.   naproxen 500 MG tablet Commonly known as:  NAPROSYN TAKE 1 TABLET BY MOUTH TWICE A DAY WITH A MEAL   rosuvastatin 40 MG tablet Commonly known as:  CRESTOR Take 1 tablet (40 mg total) by mouth daily.       LABS:  No visits with results within 1 Week(s) from this visit.  Latest known visit with results is:  Office Visit on 04/13/2016  Component Date Value Ref Range Status  . Cholesterol 04/13/2016 176  <200 mg/dL Final  . Triglycerides 04/13/2016 90  <150 mg/dL Final  . HDL 40/98/1191 41  >40 mg/dL Final  . Total CHOL/HDL Ratio 04/13/2016 4.3  <4.7 Ratio Final  . VLDL 04/13/2016 18  <30 mg/dL Final  . LDL Cholesterol 04/13/2016 117* <100 mg/dL Final  . Sodium 82/95/6213 137  135 - 146 mmol/L Final  . Potassium 04/13/2016 4.2  3.5 - 5.3 mmol/L Final  . Chloride 04/13/2016 102  98 - 110 mmol/L Final  . CO2 04/13/2016 22  20 - 31 mmol/L Final  . Glucose, Bld 04/13/2016 93  65 - 99 mg/dL Final  .  BUN 04/13/2016 12  7 - 25 mg/dL Final  . Creat 13/08/657812/10/2015 0.78  0.70 - 1.33 mg/dL Final   Comment:   For patients > or = 53 years of age: The upper reference limit for Creatinine is approximately 13% higher for people identified as African-American.     . Total Bilirubin 04/13/2016 0.7  0.2 - 1.2 mg/dL Final  . Alkaline Phosphatase 04/13/2016 61  40 - 115 U/L Final  . AST 04/13/2016 20  10 - 35 U/L Final  . ALT 04/13/2016 26  9 - 46 U/L Final  . Total Protein 04/13/2016 7.8  6.1 - 8.1 g/dL Final  . Albumin 46/96/295212/10/2015 4.7  3.6 - 5.1 g/dL Final  . Calcium 84/13/244012/10/2015 9.6  8.6 - 10.3 mg/dL Final  . WBC 10/27/253612/10/2015 8.7  4.0 - 10.5 K/uL Final  . RBC 04/13/2016 4.94  4.20 - 5.80 MIL/uL Final  . Hemoglobin 04/13/2016 15.8  13.2 - 17.1 g/dL Final  . HCT 64/40/347412/10/2015 46.2  38.5 - 50.0 % Final  . MCV 04/13/2016 93.5  80.0 - 100.0 fL Final  . MCH 04/13/2016 32.0  27.0 - 33.0 pg Final    . MCHC 04/13/2016 34.2  32.0 - 36.0 g/dL Final  . RDW 25/95/638712/10/2015 13.1  11.0 - 15.0 % Final  . Platelets 04/13/2016 263  140 - 400 K/uL Final  . MPV 04/13/2016 10.1  7.5 - 12.5 fL Final  . TSH 04/13/2016 0.36* 0.40 - 4.50 mIU/L Final  . Hgb A1c MFr Bld 04/13/2016 5.6  <5.7 % Final   Comment:   For the purpose of screening for the presence of diabetes:   <5.7%       Consistent with the absence of diabetes 5.7-6.4 %   Consistent with increased risk for diabetes (prediabetes) >=6.5 %     Consistent with diabetes   This assay result is consistent with a decreased risk of diabetes.   Currently, no consensus exists regarding use of hemoglobin A1c for diagnosis of diabetes in children.   According to American Diabetes Association (ADA) guidelines, hemoglobin A1c <7.0% represents optimal control in non-pregnant diabetic patients. Different metrics may apply to specific patient populations. Standards of Medical Care in Diabetes (ADA).     . Mean Plasma Glucose 04/13/2016 114  mg/dL Final  . Free T4 56/43/329512/10/2015 1.7  0.8 - 1.8 ng/dL Final        Review of Systems  Constitutional: Negative for reduced appetite.  HENT: Negative for trouble swallowing.   Respiratory: Negative for shortness of breath.   Cardiovascular: Negative for palpitations.  Endocrine: Negative for fatigue, decreased libido and erectile dysfunction.  Musculoskeletal: Negative for joint pain.  Neurological:       Occasionally after he is resting on his right arm for some time he may feel shaky 4 minutes after that.  Otherwise has no shakiness  Psychiatric/Behavioral: Negative for insomnia.     PHYSICAL EXAM:  BP 118/70   Pulse 67   Ht 5\' 8"  (1.727 m)   Wt 158 lb (71.7 kg)   SpO2 97%   BMI 24.02 kg/m   GENERAL:  No pallor, clubbing, lymphadenopathy or edema.    Skin:  no rash or pigmentation.  EYES:  Externally normal.   ENT: Oral mucosa and tongue normal.  THYROID:  Not palpable.  Has a vague  fullness on the right side with mild irregularity  HEART:  Normal  S1 and S2; does have 3/6 ejection murmur at the base or click.  CHEST:  Normal shape.  Lungs: Vescicular breath sounds heard equally.  No crepitations/ wheeze.  ABDOMEN:  No distention.  Liver and spleen not palpable.  No other mass or tenderness.  NEUROLOGICAL: .Reflexes are bilaterally normal at biceps and at ankles.  JOINTS:  Normal.   ASSESSMENT:    Mildly suppressed TSH levels, chronic without evidence of overt hyperthyroidism.  His free T4 levels have been consistently normal.  He does not have a goiter on exam although does have a fullness of the right lobe.  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  If these are normal will not need any further evaluation   Consultation note sent to the referring physician  [facility] 06/02/2016, 11:02 AM

## 2016-06-02 NOTE — Progress Notes (Signed)
Please let patient know that the lab result is not indicating overactive thyroid and no further action needed

## 2016-06-04 ENCOUNTER — Other Ambulatory Visit: Payer: Self-pay | Admitting: Medical

## 2016-06-06 NOTE — Telephone Encounter (Signed)
Can pt have a refill on this 

## 2016-10-31 ENCOUNTER — Encounter: Payer: Self-pay | Admitting: Medical

## 2016-10-31 ENCOUNTER — Ambulatory Visit (INDEPENDENT_AMBULATORY_CARE_PROVIDER_SITE_OTHER): Payer: Self-pay | Admitting: Medical

## 2016-10-31 VITALS — BP 128/74 | HR 76 | Temp 98.3°F | Wt 147.8 lb

## 2016-10-31 DIAGNOSIS — R634 Abnormal weight loss: Secondary | ICD-10-CM

## 2016-10-31 DIAGNOSIS — R195 Other fecal abnormalities: Secondary | ICD-10-CM

## 2016-10-31 DIAGNOSIS — F17200 Nicotine dependence, unspecified, uncomplicated: Secondary | ICD-10-CM

## 2016-10-31 DIAGNOSIS — R300 Dysuria: Secondary | ICD-10-CM

## 2016-10-31 LAB — COMPREHENSIVE METABOLIC PANEL
ALBUMIN: 4.4 g/dL (ref 3.6–5.1)
ALT: 12 U/L (ref 9–46)
AST: 14 U/L (ref 10–35)
Alkaline Phosphatase: 57 U/L (ref 40–115)
BUN: 8 mg/dL (ref 7–25)
CHLORIDE: 103 mmol/L (ref 98–110)
CO2: 24 mmol/L (ref 20–31)
CREATININE: 0.75 mg/dL (ref 0.70–1.33)
Calcium: 9.5 mg/dL (ref 8.6–10.3)
GLUCOSE: 91 mg/dL (ref 65–99)
Potassium: 4.4 mmol/L (ref 3.5–5.3)
SODIUM: 137 mmol/L (ref 135–146)
Total Bilirubin: 0.5 mg/dL (ref 0.2–1.2)
Total Protein: 7.2 g/dL (ref 6.1–8.1)

## 2016-10-31 LAB — POCT URINALYSIS DIP (PROADVANTAGE DEVICE)
Bilirubin, UA: NEGATIVE
Glucose, UA: NEGATIVE mg/dL
Ketones, POC UA: NEGATIVE mg/dL
Leukocytes, UA: NEGATIVE
Nitrite, UA: NEGATIVE
PROTEIN UA: NEGATIVE mg/dL
RBC UA: NEGATIVE
SPECIFIC GRAVITY, URINE: 1.03
UUROB: NEGATIVE
pH, UA: 6 (ref 5.0–8.0)

## 2016-10-31 LAB — CBC WITH DIFFERENTIAL/PLATELET
BASOS ABS: 0 {cells}/uL (ref 0–200)
BASOS PCT: 0 %
EOS ABS: 218 {cells}/uL (ref 15–500)
Eosinophils Relative: 2 %
HCT: 45 % (ref 38.5–50.0)
Hemoglobin: 15.4 g/dL (ref 13.2–17.1)
LYMPHS PCT: 26 %
Lymphs Abs: 2834 cells/uL (ref 850–3900)
MCH: 32.2 pg (ref 27.0–33.0)
MCHC: 34.2 g/dL (ref 32.0–36.0)
MCV: 94.1 fL (ref 80.0–100.0)
MONO ABS: 654 {cells}/uL (ref 200–950)
MPV: 10.1 fL (ref 7.5–12.5)
Monocytes Relative: 6 %
Neutro Abs: 7194 cells/uL (ref 1500–7800)
Neutrophils Relative %: 66 %
PLATELETS: 251 10*3/uL (ref 140–400)
RBC: 4.78 MIL/uL (ref 4.20–5.80)
RDW: 13.5 % (ref 11.0–15.0)
WBC: 10.9 10*3/uL — ABNORMAL HIGH (ref 4.0–10.5)

## 2016-10-31 MED ORDER — CIPROFLOXACIN HCL 500 MG PO TABS: 500.0000 mg | ORAL_TABLET | Freq: Two times a day (BID) | ORAL | 0 refills | Status: DC

## 2016-10-31 NOTE — Progress Notes (Signed)
Subjective: Chief Complaint  Patient presents with  . burning with urine    burning with urine 1 weeks   Here for dysuria.  He notes for the last 10 days having slight burning at end of urination.  No penile discharge.   Is intermittent.   Drinking a lot of water.  Has some polyuria but drinking more water in general.  Gets up typically once nightly to urinate, and this has been unchanged in general.   No blood in urine or stool.   No recent bowel changes.  Has drank some cranberry juice for urinary symptoms.   Losing weight lately.  Has been doing some dieting but thinks the weight loss is more than expected.   no prior colonoscopy at this point.    Wife had some issue at work and they lost insurance.   No fever, no night sweats.   Appetite is ok.  Married, no concern n for STD.   No glands swollen.  No other aggravating or relieving factors. No other complaint.   Past Medical History:  Diagnosis Date  . Aortic valve insufficiency 12/15/2014   Moderate by ECHO 12/15/14 normal LV function   . Back pain   . Dizziness and giddiness   . Hypertension   . Loss of balance   . Peripheral vascular disease [facility])    Current Outpatient Prescriptions on File Prior to Visit  Medication Sig Dispense Refill  . aspirin 81 MG EC tablet Take 1 tablet (81 mg total) by mouth daily. 90 tablet 3  . lisinopril (PRINIVIL,ZESTRIL) 10 MG tablet Take 1 tablet (10 mg total) by mouth daily. 90 tablet 3  . rosuvastatin (CRESTOR) 40 MG tablet Take 1 tablet (40 mg total) by mouth daily. 90 tablet 1   No current facility-administered medications on file prior to visit.    No past surgical history on file.  Review of Systems Constitutional: -fever, -chills, -sweats, +unexpected weight change,-fatigue ENT: -runny nose, -ear pain, -sore throat Cardiology:  -chest pain, -palpitations, -edema Respiratory: -cough, -shortness of breath, -wheezing Gastroenterology: -abdominal pain, -nausea, -vomiting, -diarrhea, -constipation   Hematology: -bleeding or bruising problems Musculoskeletal: -arthralgias, -myalgias, -joint swelling, -back pain Ophthalmology: -vision changes Urology: +dysuria, -difficulty urinating, -hematuria, -urinary frequency, -urgency Neurology: -headache, -weakness, -tingling, -numbness     Objective: BP 128/74   Pulse 76   Temp 98.3 F (36.8 C)   Wt 147 lb 12.8 oz (67 kg)   BMI 22.47 kg/m   Wt Readings from Last 3 Encounters:  10/31/16 147 lb 12.8 oz (67 kg)  06/02/16 158 lb (71.7 kg)  04/13/16 162 lb 9.6 oz (73.8 kg)    General appearance: alert, no distress, WD/WN Oral cavity: MMM, no lesions Neck: supple, no lymphadenopathy, no thyromegaly, no masses Heart: RRR, normal S1, S2, no murmurs Lungs: CTA bilaterally, no wheezes, rhonchi, or rales Abdomen: +bs, soft, non tender, non distended, no masses, no hepatomegaly, no splenomegaly Back: non tender Musculoskeletal: nontender, no swelling, no obvious deformity Extremities: no edema, no cyanosis, no clubbing Pulses: 2+ symmetric, upper and lower extremities, normal cap refill Neurological: alert, oriented x 3, CN2-12 intact, strength normal upper extremities and lower extremities, sensation normal throughout, DTRs 2+ throughout, no cerebellar signs, gait normal Psychiatric: normal affect, behavior normal, pleasant  GU: normal male external genitalia, circumcised, nontender, no masses, no hernia, no lymphadenopathy Rectal: anus normal tone, external hemorrhoids present, mild, prostate WNL, occult + stool    Assessment: Encounter Diagnoses  Name Primary?  . Burning with urination  Yes  . Heme + stool   . Weight loss   . Smoker     Plan: Discusses his symptoms, recent unexpected weight loss, heme + stool.  Advised labs today and other eval, but he recently found out his insurance wasn't in effect.  He was covered under wife's insurer.   Gave the following recommendations.  He will consider and let me know.  cipro  sent.  Patient Instructions   Encounter Diagnoses  Name Primary?  . Burning with urination Yes  . Heme + stool   . Weight loss   . Smoker    recommendations The urine results are normal today but your stool screen is positive for blood I am concerned about your weight loss  Typically for blood + stool, weight loss, and age over 53yo, we would recommend the following:   Labs to check your blood counts, prostate lab  Referral to gastroenterology to screen for colon cancer  Chest xray to rule out tumor  I recommend you and your wife recheck with human resources about her insurance coverage  Ask human resources about [employer] insurance   Check on getting short term private insurance to be able to check some labs, xray, colonoscopy as above to help rule out worrisome causes of your symptoms.  Burning with urination could be from caffeine, but it is possible you could have infection  I will prescribe Cipro antibiotic today.  If you cut back on caffeine but the symptoms persist, then I recommend you begin the Cipro antibiotic     Gretta ArabMustapha was seen today for burning with urine.  Diagnoses and all orders for this visit:  Burning with urination -     POCT Urinalysis DIP (Proadvantage Device) -     Comprehensive metabolic panel -     CBC with Differential/Platelet -     PSA  Heme + stool -     Comprehensive metabolic panel -     CBC with Differential/Platelet -     PSA  Weight loss -     Comprehensive metabolic panel -     CBC with Differential/Platelet -     PSA  Smoker -     Comprehensive metabolic panel -     CBC with Differential/Platelet -     PSA  Other orders -     ciprofloxacin (CIPRO) 500 MG tablet; Take 1 tablet (500 mg total) by mouth 2 (two) times daily.

## 2016-10-31 NOTE — Patient Instructions (Signed)
Encounter Diagnoses  Name Primary?  . Burning with urination Yes  . Heme + stool   . Weight loss   . Smoker    recommendations The urine results are normal today but your stool screen is positive for blood I am concerned about your weight loss  Typically for blood + stool, weight loss, and age over 53yo, we would recommend the following:   Labs to check your blood counts, prostate lab  Referral to gastroenterology to screen for colon cancer  Chest xray to rule out tumor  I recommend you and your wife recheck with human resources about her insurance coverage  Ask human resources about [employer] insurance   Check on getting short term private insurance to be able to check some labs, xray, colonoscopy as above to help rule out worrisome causes of your symptoms.  Burning with urination could be from caffeine, but it is possible you could have infection  I will prescribe Cipro antibiotic today.  If you cut back on caffeine but the symptoms persist, then I recommend you begin the Cipro antibiotic

## 2016-11-01 LAB — PSA: PSA: 0.3 ng/mL (ref <=4.0)

## 2016-11-15 ENCOUNTER — Other Ambulatory Visit: Payer: Self-pay | Admitting: Medical

## 2016-11-15 ENCOUNTER — Ambulatory Visit: Payer: Self-pay | Admitting: Medical

## 2016-11-28 ENCOUNTER — Telehealth: Payer: Self-pay | Admitting: Medical

## 2016-11-28 ENCOUNTER — Other Ambulatory Visit: Payer: Self-pay | Admitting: Medical

## 2016-11-28 MED ORDER — CIPROFLOXACIN HCL 500 MG PO TABS: 500.0000 mg | ORAL_TABLET | Freq: Two times a day (BID) | ORAL | 0 refills | Status: DC

## 2016-11-28 NOTE — Telephone Encounter (Signed)
rx sent but lets see him back either as planned or within 7-10 days

## 2016-11-28 NOTE — Telephone Encounter (Signed)
Pt came in and paid on account. While here he rescheduled his appt for tomorrow. Pt then stated he take his last pill today and is some better. He thinks maybe he should have another round. Pt uses [facility] on [address] and can be reached at [phone].

## 2016-11-28 NOTE — Telephone Encounter (Signed)
Pt already has an appt for nextr week. Pt was informed to check with pharmacy regarding refill.

## 2016-11-29 ENCOUNTER — Ambulatory Visit: Payer: Self-pay | Admitting: Medical

## 2016-12-07 ENCOUNTER — Encounter: Payer: Self-pay | Admitting: Medical

## 2016-12-07 ENCOUNTER — Ambulatory Visit (INDEPENDENT_AMBULATORY_CARE_PROVIDER_SITE_OTHER): Payer: Self-pay | Admitting: Medical

## 2016-12-07 VITALS — BP 112/68 | HR 70 | Wt 148.0 lb

## 2016-12-07 DIAGNOSIS — R300 Dysuria: Secondary | ICD-10-CM

## 2016-12-07 DIAGNOSIS — N401 Enlarged prostate with lower urinary tract symptoms: Secondary | ICD-10-CM

## 2016-12-07 DIAGNOSIS — R634 Abnormal weight loss: Secondary | ICD-10-CM

## 2016-12-07 DIAGNOSIS — N3943 Post-void dribbling: Secondary | ICD-10-CM

## 2016-12-07 LAB — POCT URINALYSIS DIP (PROADVANTAGE DEVICE)
BILIRUBIN UA: NEGATIVE
BILIRUBIN UA: NEGATIVE mg/dL
Blood, UA: NEGATIVE
GLUCOSE UA: NEGATIVE mg/dL
LEUKOCYTES UA: NEGATIVE
Nitrite, UA: NEGATIVE
Protein Ur, POC: NEGATIVE mg/dL
Specific Gravity, Urine: 1.03
Urobilinogen, Ur: NEGATIVE
pH, UA: 6 (ref 5.0–8.0)

## 2016-12-07 MED ORDER — TAMSULOSIN HCL 0.4 MG PO CAPS: 0.4000 mg | ORAL_CAPSULE | Freq: Every day | ORAL | 3 refills | Status: DC

## 2016-12-07 NOTE — Progress Notes (Signed)
Subjective: Chief Complaint  Patient presents with  . Follow-up    still having some burning while urintaion    here for f/u on  Urinary issues . Last visit we treated for possible prostatitis and he is improved.  He does still notes some discomfort with urination at times, does have leakages/dripping at end of urination, and similar symptoms with ejaculation.  No concern for STD, married.   Denies fever, NVD, no blood in urine or semen.    No blood in stool.  Hasn't had any additional weight loss.   Has cut back on caffeine.  Was drinking 3 cups of coffee prior to last visit.  He is still working to get insurance to help evaluate the other issues we discussed last time, including need for CXR, colonoscopy.    Past Medical History:  Diagnosis Date  . Aortic valve insufficiency 12/15/2014   Moderate by ECHO 12/15/14 normal LV function   . Back pain   . Dizziness and giddiness   . Hypertension   . Loss of balance   . Peripheral vascular disease [facility](HCC)    Current Outpatient Prescriptions on File Prior to Visit  Medication Sig Dispense Refill  . aspirin 81 MG EC tablet Take 1 tablet (81 mg total) by mouth daily. 90 tablet 3  . lisinopril (PRINIVIL,ZESTRIL) 10 MG tablet Take 1 tablet (10 mg total) by mouth daily. 90 tablet 3  . rosuvastatin (CRESTOR) 40 MG tablet Take 1 tablet (40 mg total) by mouth daily. 90 tablet 1   No current facility-administered medications on file prior to visit.    ROS as in subjective    Objective: BP 112/68   Pulse 70   Wt 148 lb (67.1 kg)   SpO2 97%   BMI 22.50 kg/m   General appearance: alert, no distress, WD/WN,  Oral cavity: MMM, no lesions Neck: supple, no lymphadenopathy, no thyromegaly, no masses Lungs: CTA bilaterally, no wheezes, rhonchi, or rales Abdomen: +bs, soft, non tender, non distended, no masses, no hepatomegaly, no splenomegaly Pulses: 1+ symmetric, upper and lower extremities, normal cap refill    Assessment: Encounter Diagnoses   Name Primary?  . Burning with urination Yes  . Benign prostatic hyperplasia with post-void dribbling   . Weight loss      Plan: discussed symptoms from last visit which has improved .  Begin trial of Flomax for BPH.  discussed risks/benefits of medication.   Call report 1wk  Weight loss - stable weight for now.  He attributes the weight loss to eating one meal daily and being under stress before he was laid off recently.  He is looking for work.  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   From last visit we discussed getting chest CXR, colonoscopy.  He is still trying to get insurance before proceeding, but glad to see weight stable.   Gretta ArabMustapha was seen today for follow-up.  Diagnoses and all orders for this visit:  Burning with urination -     POCT Urinalysis DIP (Proadvantage Device)  Benign prostatic hyperplasia with post-void dribbling  Weight loss  Other orders -     tamsulosin (FLOMAX) 0.4 MG CAPS capsule; Take 1 capsule (0.4 mg total) by mouth daily.

## 2017-01-12 ENCOUNTER — Telehealth: Payer: Self-pay | Admitting: Medical

## 2017-01-12 NOTE — Telephone Encounter (Signed)
Pt was notified of this 

## 2017-01-12 NOTE — Telephone Encounter (Signed)
If he feels it helps keep his urine flow easy and prevents getting up multiple times per night to urinate, then c/t flomax. If not, can stop it for now

## 2017-01-12 NOTE — Telephone Encounter (Signed)
Pt called and states that he is feeling better, he wants to know if he still needs to continue taking the  flomax, he has taking it for one moth,he is having to get Up one time at night to go to the bathroom, the burning has stopped, he just wants to know what he needs to do, he can be reached at [phone]

## 2017-01-26 ENCOUNTER — Ambulatory Visit: Payer: Self-pay | Admitting: Medical

## 2017-01-30 ENCOUNTER — Ambulatory Visit: Payer: Self-pay | Admitting: Medical

## 2017-05-06 ENCOUNTER — Other Ambulatory Visit: Payer: Self-pay | Admitting: Medical

## 2017-05-29 ENCOUNTER — Other Ambulatory Visit: Payer: Self-pay | Admitting: Medical

## 2017-05-30 NOTE — Telephone Encounter (Signed)
Called the number that we had on file for pt he know longer has this number, pt needs an appt for meds

## 2017-06-17 ENCOUNTER — Other Ambulatory Visit: Payer: Self-pay | Admitting: Medical

## 2017-06-19 NOTE — Telephone Encounter (Signed)
Called and l/m for pt call us back to set up an appt. For a med check.

## 2017-06-30 ENCOUNTER — Other Ambulatory Visit: Payer: Self-pay | Admitting: Medical

## 2017-07-07 ENCOUNTER — Other Ambulatory Visit: Payer: Self-pay

## 2017-07-07 MED ORDER — LISINOPRIL 10 MG PO TABS: 10.0000 mg | ORAL_TABLET | Freq: Every day | ORAL | 3 refills | Status: DC

## 2017-08-28 ENCOUNTER — Telehealth: Payer: Self-pay | Admitting: Medical

## 2017-08-28 NOTE — Telephone Encounter (Signed)
Pt come in and is wanting to have the chest xray that was ordered back in 2016 he has insurance now informed pt that you was out of town, told him you would be back on Monday, put copy of insurance card on file pt can be reached at [phone] pt is coming in for a physical 09/12/2017

## 2017-09-03 ENCOUNTER — Other Ambulatory Visit: Payer: Self-pay | Admitting: Medical

## 2017-09-03 DIAGNOSIS — Z122 Encounter for screening for malignant neoplasm of respiratory organs: Secondary | ICD-10-CM

## 2017-09-03 NOTE — Telephone Encounter (Signed)
The plan was actually chest CT scan.  If insurance will cover (and he will need to verify), then we can move forward with chest CT.  I'll order and he is due for med check or physical as well.

## 2017-09-05 ENCOUNTER — Other Ambulatory Visit: Payer: Self-pay | Admitting: Medical

## 2017-09-05 DIAGNOSIS — R079 Chest pain, unspecified: Secondary | ICD-10-CM

## 2017-09-05 DIAGNOSIS — I739 Peripheral vascular disease, unspecified: Secondary | ICD-10-CM

## 2017-09-05 DIAGNOSIS — Z720 Tobacco use: Secondary | ICD-10-CM

## 2017-09-05 NOTE — Telephone Encounter (Signed)
Pt can't have lung cancer screening due to not being between age 54-77. Patient can have ct without contrast. Please advise.

## 2017-09-05 NOTE — Telephone Encounter (Signed)
Insurance will cover CT chest and there is no authorization needed.

## 2017-09-05 NOTE — Telephone Encounter (Signed)
Ct has been scheduled for ct scan. Pt was informed of date, time and place of scan. Pt was also provided imaging phone number.

## 2017-09-05 NOTE — Telephone Encounter (Signed)
I updated the order

## 2017-09-05 NOTE — Telephone Encounter (Signed)
Good.   The order is in the system, so please schedule the Chest CT with [facility]

## 2017-09-06 HISTORY — PX: NO PAST SURGERIES: SHX2092

## 2017-09-08 ENCOUNTER — Other Ambulatory Visit: Payer: Self-pay

## 2017-09-12 ENCOUNTER — Other Ambulatory Visit: Payer: Self-pay | Admitting: Medical

## 2017-09-12 ENCOUNTER — Ambulatory Visit (INDEPENDENT_AMBULATORY_CARE_PROVIDER_SITE_OTHER): Payer: 59 | Admitting: Medical

## 2017-09-12 ENCOUNTER — Encounter: Payer: Self-pay | Admitting: Medical

## 2017-09-12 VITALS — BP 108/60 | HR 64 | Temp 98.2°F | Ht 67.25 in | Wt 155.0 lb

## 2017-09-12 DIAGNOSIS — Z1159 Encounter for screening for other viral diseases: Secondary | ICD-10-CM

## 2017-09-12 DIAGNOSIS — Z7189 Other specified counseling: Secondary | ICD-10-CM | POA: Diagnosis not present

## 2017-09-12 DIAGNOSIS — Z720 Tobacco use: Secondary | ICD-10-CM | POA: Diagnosis not present

## 2017-09-12 DIAGNOSIS — E785 Hyperlipidemia, unspecified: Secondary | ICD-10-CM

## 2017-09-12 DIAGNOSIS — Z125 Encounter for screening for malignant neoplasm of prostate: Secondary | ICD-10-CM | POA: Diagnosis not present

## 2017-09-12 DIAGNOSIS — I351 Nonrheumatic aortic (valve) insufficiency: Secondary | ICD-10-CM

## 2017-09-12 DIAGNOSIS — R202 Paresthesia of skin: Secondary | ICD-10-CM | POA: Diagnosis not present

## 2017-09-12 DIAGNOSIS — I739 Peripheral vascular disease, unspecified: Secondary | ICD-10-CM | POA: Diagnosis not present

## 2017-09-12 DIAGNOSIS — I10 Essential (primary) hypertension: Secondary | ICD-10-CM

## 2017-09-12 DIAGNOSIS — Z122 Encounter for screening for malignant neoplasm of respiratory organs: Secondary | ICD-10-CM | POA: Diagnosis not present

## 2017-09-12 DIAGNOSIS — Z1211 Encounter for screening for malignant neoplasm of colon: Secondary | ICD-10-CM | POA: Diagnosis not present

## 2017-09-12 DIAGNOSIS — R7989 Other specified abnormal findings of blood chemistry: Secondary | ICD-10-CM | POA: Diagnosis not present

## 2017-09-12 DIAGNOSIS — Z0000 Encounter for general adult medical examination without abnormal findings: Secondary | ICD-10-CM

## 2017-09-12 DIAGNOSIS — Z7185 Encounter for immunization safety counseling: Secondary | ICD-10-CM | POA: Insufficient documentation

## 2017-09-12 LAB — POCT URINALYSIS DIP (PROADVANTAGE DEVICE)
Bilirubin, UA: NEGATIVE
Blood, UA: NEGATIVE
GLUCOSE UA: NEGATIVE mg/dL
Ketones, POC UA: NEGATIVE mg/dL
Leukocytes, UA: NEGATIVE
NITRITE UA: NEGATIVE
Protein Ur, POC: NEGATIVE mg/dL
pH, UA: 8 (ref 5.0–8.0)

## 2017-09-12 NOTE — Progress Notes (Signed)
Subjective:   HPI  Terry Gross is a 52 y.o. male who presents for Chief Complaint  Patient presents with  . Annual Exam    Medical care team includes: Giani Betzold, [name], PA-C here for primary care Dentist Eye doctor  Concerns: Has insurance now.  HTN - taking Lisinopril  daily.   hyperlipidemia - Not compliant with Crestor.   Taking Flomax for BPH symptoms.  Still smokes. Has chest CT for tomorrow for lung cancer screen  Reviewed their medical, surgical, family, social, medication, and allergy history and updated chart as appropriate.  Past Medical History:  Diagnosis Date  . Aortic valve insufficiency 12/15/2014   Moderate by ECHO 12/15/14 normal LV function   . Back pain   . Dizziness and giddiness   . Hypertension   . Loss of balance   . Peripheral vascular disease (HCC)     No past surgical history on file.  Social History   Socioeconomic History  . Marital status: Married    Spouse name: Not on file  . Number of children: Not on file  . Years of education: Not on file  . Highest education level: Not on file  Occupational History  . Not on file  Social Needs  . Financial resource strain: Not on file  . Food insecurity:    Worry: Not on file    Inability: Not on file  . Transportation needs:    Medical: Not on file    Non-medical: Not on file  Tobacco Use  . Smoking status: Current Every Day Smoker    Packs/day: 0.50    Years: 30.00    Pack years: 15.00    Types: Cigarettes  . Smokeless tobacco: Never Used  Substance and Sexual Activity  . Alcohol use: No  . Drug use: No  . Sexual activity: Not on file  Lifestyle  . Physical activity:    Days per week: Not on file    Minutes per session: Not on file  . Stress: Not on file  Relationships  . Social connections:    Talks on phone: Not on file    Gets together: Not on file    Attends religious service: Not on file    Active member of club or organization: Not on file    Attends  meetings of clubs or organizations: Not on file    Relationship status: Not on file  . Intimate partner violence:    Fear of current or ex partner: Not on file    Emotionally abused: Not on file    Physically abused: Not on file    Forced sexual activity: Not on file  Other Topics Concern  . Not on file  Social History Narrative  . Not on file    Family History  Problem Relation Age of Onset  . CVA Mother   . Thyroid disease Neg Hx      Current Outpatient Medications:  .  ASPIRIN ADULT LOW STRENGTH 81 MG EC tablet, TAKE 1 TABLET (81 MG TOTAL) BY MOUTH DAILY., Disp: 90 tablet, Rfl: 0 .  lisinopril (PRINIVIL,ZESTRIL) 10 MG tablet, Take 1 tablet (10 mg total) by mouth daily., Disp: 90 tablet, Rfl: 3 .  tamsulosin (FLOMAX) 0.4 MG CAPS capsule, Take 1 capsule (0.4 mg total) by mouth daily., Disp: 30 capsule, Rfl: 3 .  rosuvastatin (CRESTOR) 40 MG tablet, Take 1 tablet (40 mg total) by mouth daily. (Patient not taking: Reported on 09/12/2017), Disp: 90 tablet, Rfl: 1  No Known  Allergies   Review of Systems Constitutional: -fever, -chills, -sweats, -unexpected weight change, -decreased appetite, -fatigue Allergy: -sneezing, -itching, -congestion Dermatology: -changing moles, --rash, -lumps ENT: -runny nose, -ear pain, -sore throat, -hoarseness, -sinus pain, -teeth pain, - ringing in ears, -hearing loss, -nosebleeds Cardiology: -chest pain, -palpitations, -swelling, -difficulty breathing when lying flat, -waking up short of breath Respiratory: -cough, -shortness of breath, -difficulty breathing with exercise or exertion, -wheezing, -coughing up blood Gastroenterology: -abdominal pain, -nausea, -vomiting, -diarrhea, -constipation, -blood in stool, -changes in bowel movement, -difficulty swallowing or eating Hematology: -bleeding, -bruising  Musculoskeletal: -joint aches, -muscle aches, -joint swelling, -back pain, -neck pain, -cramping, -changes in gait Ophthalmology: denies vision  changes, eye redness, itching, discharge Urology: -burning with urination, -difficulty urinating, -blood in urine, -urinary frequency, -urgency, -incontinence Neurology: -headache, -weakness, -tingling, -numbness, -memory loss, -falls, -dizziness Psychology: -depressed mood, -agitation, -sleep problems Male GU: no testicular mass, pain, no lymph nodes swollen, no swelling, no rash.     Objective:  BP 108/60   Pulse 64   Temp 98.2 F (36.8 C) (Oral)   Ht 5' 7.25" (1.708 m)   Wt 155 lb (70.3 kg)   SpO2 97%   BMI 24.10 kg/m    Wt Readings from Last 3 Encounters:  09/12/17 155 lb (70.3 kg)  12/07/16 148 lb (67.1 kg)  10/31/16 147 lb 12.8 oz (67 kg)   BP Readings from Last 3 Encounters:  09/12/17 108/60  12/07/16 112/68  10/31/16 128/74    General appearance: alert, no distress, WD/WN, male Skin: few scattered macules ,no worrisome lesions HEENT: normocephalic, conjunctiva/corneas normal, sclerae anicteric, PERRLA, EOMi, nares patent, no discharge or erythema, pharynx normal Oral cavity: MMM, tongue normal, teeth normal Neck: supple, no lymphadenopathy, no thyromegaly, no masses, normal ROM, left carotid bruit Chest: non tender, normal shape and expansion Heart: 2/6 holosystolic brief murmur, otherwise RRR, normal S1, S2 Lungs: CTA bilaterally, no wheezes, rhonchi, or rales Abdomen: +bs, soft, non tender, non distended, no masses, no hepatomegaly, no splenomegaly, no bruits Back: non tender, normal ROM, no scoliosis Musculoskeletal: upper extremities non tender, no obvious deformity, normal ROM throughout, lower extremities non tender, no obvious deformity, normal ROM throughout Extremities: no edema, no cyanosis, no clubbing Pulses: 1+ symmetric pedal pulses, 2+ radial pulses, normal cap refill Neurological: alert, oriented x 3, CN2-12 intact, strength normal upper extremities and lower extremities, sensation normal throughout, DTRs 2+ throughout, no cerebellar signs, gait  normal Psychiatric: normal affect, behavior normal, pleasant  GU: normal male external genitalia,circumcised, non tender, no masses, no hernia, no lymphadenopathy Rectal:deferred   Assessment and Plan :   Encounter Diagnoses  Name Primary?  . Encounter for health maintenance examination in adult Yes  . Essential hypertension   . Peripheral vascular disease with claudication (HCC)   . Aortic valve insufficiency, etiology of cardiac valve disease unspecified   . Hyperlipidemia, unspecified hyperlipidemia type   . Tobacco abuse   . Paresthesia   . Abnormal thyroid blood test   . Screen for colon cancer   . Screening for prostate cancer   . Encounter for screening for lung cancer   . Vaccine counseling   . Encounter for hepatitis C screening test for low risk patient     Physical exam - discussed and counseled on healthy lifestyle, diet, exercise, preventative care, vaccinations, sick and well care, proper use of emergency dept and after hours care, and addressed their concerns.    Health screening: See your eye doctor yearly for routine vision care. See your  dentist yearly for routine dental care including hygiene visits twice yearly.  Specific Concerns today:  . I recommend we refer you for a screening colonoscopy.   This is a screen for colon cancer.  Let me know if you are agreeable to this. . I hear some blood flow changes in your neck.  You have a history of high cholesterol. If agreeable, we can send you for screening of blood flow in the neck called a Carotid Ultrasound.    . We will call with lab results . Shingles vaccine:  I recommend you have a shingles vaccine to help prevent shingles or herpes zoster outbreak.   Please call your insurer to inquire about coverage for the Shingrix vaccine given in 2 doses.   Some insurers cover this vaccine after age 39, some cover this after age 30.  If your insurer covers this, then call to schedule appointment to have this vaccine  here. . Get a yearly flu shot in the fall . I don't have a tetanus booster on file.  We do this every 10 years.  If you haven't had one in the past 7-10 years, we can go ahead and update this now.   Let me know. . Dr. Donnie Aho saw you back in 2017.  You are due back to see him now.   Dr. Donnie Aho, [phone].   Acute issues discussed: none  Separate significant chronic issues discussed: HTN - c/t current medication ChestCT screen scheduled for tomorrow advised he stop smoking.   He is not ready to quit Counseled on diet and exercise hyperlipidemia - needs to restart statin bruit - advised carotid US.  He will consider.  Nykolas was seen today for annual exam.  Diagnoses and all orders for this visit:  Encounter for health maintenance examination in adult -     Comprehensive metabolic panel -     CBC with Differential/Platelet -     Lipid panel -     PSA -     TSH -     Hemoglobin A1c -     T4, free -     Hepatitis C antibody -     Ambulatory referral to Gastroenterology  Essential hypertension  Peripheral vascular disease with claudication (HCC) -     Lipid panel  Aortic valve insufficiency, etiology of cardiac valve disease unspecified  Hyperlipidemia, unspecified hyperlipidemia type  Tobacco abuse  Paresthesia  Abnormal thyroid blood test  Screen for colon cancer -     Ambulatory referral to Gastroenterology  Screening for prostate cancer -     PSA  Encounter for screening for lung cancer  Vaccine counseling  Encounter for hepatitis C screening test for low risk patient -     Hepatitis C antibody   Follow-up pending labs, yearly for physical

## 2017-09-12 NOTE — Patient Instructions (Addendum)
Thanks for trusting [place] with your health care and for coming in for a physical today.  Below are some general recommendations I have for you:  Yearly screenings See your eye doctor yearly for routine vision care. See your dentist yearly for routine dental care including hygiene visits twice yearly. See me here yearly for a routine physical and preventative care visit   Specific Concerns today:  . I recommend we refer you for a screening colonoscopy.   This is a screen for colon cancer.  Let me know if you are agreeable to this. . I hear some blood flow changes in your neck.  You have a history of high cholesterol. If agreeable, we can send you for screening of blood flow in the neck called a Carotid Ultrasound.    . We will call with lab results . Shingles vaccine:  I recommend you have a shingles vaccine to help prevent shingles or herpes zoster outbreak.   Please call your insurer to inquire about coverage for the Shingrix vaccine given in 2 doses.   Some insurers cover this vaccine after age 59, some cover this after age 35.  If your insurer covers this, then call to schedule appointment to have this vaccine here. . Get a yearly flu shot in the fall . I don't have a tetanus booster on file.  We do this every 10 years.  If you haven't had one in the past 7-10 years, we can go ahead and update this now.   Let me know. . Dr. Donnie Aho saw you back in 2017.  You are due back to see him now.   Dr. Donnie Aho, [phone].    Please follow up yearly for a physical.   Preventative Care for Adults - Male      MAINTAIN REGULAR HEALTH EXAMS:  A routine yearly physical is a good way to check in with your primary care provider about your health and preventive screening. It is also an opportunity to share updates about your health and any concerns you have, and receive a thorough all-over exam.   Most health insurance companies pay for at least some preventative services.  Check with your health plan for  specific coverages.  WHAT PREVENTATIVE SERVICES DO WOMEN NEED?  Adult men should have their weight and blood pressure checked regularly.   Men age 83 and older should have their cholesterol levels checked regularly.  Beginning at age 24 and continuing to age 80, men should be screened for colorectal cancer.  Certain people may need continued testing until age 65.  Updating vaccinations is part of preventative care.  Vaccinations help protect against diseases such as the flu.  Osteoporosis is a disease in which the bones lose minerals and strength as we age. Men ages 63 and over should discuss this with their caregivers  Lab tests are generally done as part of preventative care to screen for anemia and blood disorders, to screen for problems with the kidneys and liver, to screen for bladder problems, to check blood sugar, and to check your cholesterol level.  Preventative services generally include counseling about diet, exercise, avoiding tobacco, drugs, excessive alcohol consumption, and sexually transmitted infections.    GENERAL RECOMMENDATIONS FOR GOOD HEALTH:  Healthy diet:  Eat a variety of foods, including fruit, vegetables, animal or vegetable protein, such as meat, fish, chicken, and eggs, or beans, lentils, tofu, and grains, such as rice.  Drink plenty of water daily.  Decrease saturated fat in the diet, avoid lots  of red meat, processed foods, sweets, fast foods, and fried foods.  Exercise:  Aerobic exercise helps maintain good heart health. At least 30-40 minutes of moderate-intensity exercise is recommended. For example, a brisk walk that increases your heart rate and breathing. This should be done on most days of the week.   Find a type of exercise or a variety of exercises that you enjoy so that it becomes a part of your daily life.  Examples are running, walking, swimming, water aerobics, and biking.  For motivation and support, explore group exercise such as aerobic  class, spin class, Zumba, Yoga,or  martial arts, etc.    Set exercise goals for yourself, such as a certain weight goal, walk or run in a race such as a 5k walk/run.  Speak to your primary care provider about exercise goals.  Disease prevention:  If you smoke or chew tobacco, find out from your caregiver how to quit. It can literally save your life, no matter how long you have been a tobacco user. If you do not use tobacco, never begin.   Maintain a healthy diet and normal weight. Increased weight leads to problems with blood pressure and diabetes.   The Body Mass Index or BMI is a way of measuring how much of your body is fat. Having a BMI above 27 increases the risk of heart disease, diabetes, hypertension, stroke and other problems related to obesity. Your caregiver can help determine your BMI and based on it develop an exercise and dietary program to help you achieve or maintain this important measurement at a healthful level.  High blood pressure causes heart and blood vessel problems.  Persistent high blood pressure should be treated with medicine if weight loss and exercise do not work.   Fat and cholesterol leaves deposits in your arteries that can block them. This causes heart disease and vessel disease elsewhere in your body.  If your cholesterol is found to be high, or if you have heart disease or certain other medical conditions, then you may need to have your cholesterol monitored frequently and be treated with medication.   Ask if you should have a cardiac stress test if your history suggests this. A stress test is a test done on a treadmill that looks for heart disease. This test can find disease prior to there being a problem.  Osteoporosis is a disease in which the bones lose minerals and strength as we age. This can result in serious bone fractures. Risk of osteoporosis can be identified using a bone density scan. Men ages 25 and over should discuss this with their caregivers. Ask  your caregiver whether you should be taking a calcium supplement and Vitamin D, to reduce the rate of osteoporosis.   Avoid drinking alcohol in excess (more than two drinks per day).  Avoid use of street drugs. Do not share needles with anyone. Ask for professional help if you need assistance or instructions on stopping the use of alcohol, cigarettes, and/or drugs.  Brush your teeth twice a day with fluoride toothpaste, and floss once a day. Good oral hygiene prevents tooth decay and gum disease. The problems can be painful, unattractive, and can cause other health problems. Visit your dentist for a routine oral and dental check up and preventive care every 6-12 months.   Look at your skin regularly.  Use a mirror to look at your back. Notify your caregivers of changes in moles, especially if there are changes in shapes, colors, a size  larger than a pencil eraser, an irregular border, or development of new moles.  Safety:  Use seatbelts 100% of the time, whether driving or as a passenger.  Use safety devices such as hearing protection if you work in environments with loud noise or significant background noise.  Use safety glasses when doing any work that could send debris in to the eyes.  Use a helmet if you ride a bike or motorcycle.  Use appropriate safety gear for contact sports.  Talk to your caregiver about gun safety.  Use sunscreen with a SPF (or skin protection factor) of 15 or greater.  Lighter skinned people are at a greater risk of skin cancer. Don't forget to also wear sunglasses in order to protect your eyes from too much damaging sunlight. Damaging sunlight can accelerate cataract formation.   Practice safe sex. Use condoms. Condoms are used for birth control and to help reduce the spread of sexually transmitted infections (or STIs).  Some of the STIs are gonorrhea (the clap), chlamydia, syphilis, trichomonas, herpes, HPV (human papilloma virus) and HIV (human immunodeficiency virus)  which causes AIDS. The herpes, HIV and HPV are viral illnesses that have no cure. These can result in disability, cancer and death.   Keep carbon monoxide and smoke detectors in your home functioning at all times. Change the batteries every 6 months or use a model that plugs into the wall.   Vaccinations:  Stay up to date with your tetanus shots and other required immunizations. You should have a booster for tetanus every 10 years. Be sure to get your flu shot every year, since 5%-20% of the [place] population comes down with the flu. The flu vaccine changes each year, so being vaccinated once is not enough. Get your shot in the fall, before the flu season peaks.   Other vaccines to consider:  Human Papilloma Virus or HPV causes cancer of the cervix, and other infections that can be transmitted from person to person. There is a vaccine for HPV, and males should get immunized between the ages of 82 and 69. It requires a series of 3 shots.   Pneumococcal vaccine to protect against certain types of pneumonia.  This is normally recommended for adults age 11 or older.  However, adults younger than 54 years old with certain underlying conditions such as diabetes, heart or lung disease should also receive the vaccine.  Shingles vaccine to protect against Varicella Zoster if you are older than age 32, or younger than 54 years old with certain underlying illness.  If you have not had the Shingrix vaccine, please call your insurer to inquire about coverage for the Shingrix vaccine given in 2 doses.   Some insurers cover this vaccine after age 83, some cover this after age 54.  If your insurer covers this, then call to schedule appointment to have this vaccine here  Hepatitis A vaccine to protect against a form of infection of the liver by a virus acquired from food.  Hepatitis B vaccine to protect against a form of infection of the liver by a virus acquired from blood or body fluids, particularly if you work in  health care.  If you plan to travel internationally, check with your local health department for specific vaccination recommendations.   What should I know about cancer screening? Many types of cancers can be detected early and may often be prevented. Lung Cancer  You should be screened every year for lung cancer if: ? You are a current  smoker who has smoked for at least 30 years. ? You are a former smoker who has quit within the past 15 years.  Talk to your health care provider about your screening options, when you should start screening, and how often you should be screened.  Colorectal Cancer  Routine colorectal cancer screening usually begins at 54 years of age and should be repeated every 5-10 years until you are 54 years old. You may need to be screened more often if early forms of precancerous polyps or small growths are found. Your health care provider may recommend screening at an earlier age if you have risk factors for colon cancer.  Your health care provider may recommend using home test kits to check for hidden blood in the stool.  A small camera at the end of a tube can be used to examine your colon (sigmoidoscopy or colonoscopy). This checks for the earliest forms of colorectal cancer.  Prostate and Testicular Cancer  Depending on your age and overall health, your health care provider may do certain tests to screen for prostate and testicular cancer.  Talk to your health care provider about any symptoms or concerns you have about testicular or prostate cancer.  Skin Cancer  Check your skin from head to toe regularly.  Tell your health care provider about any new moles or changes in moles, especially if: ? There is a change in a mole's size, shape, or color. ? You have a mole that is larger than a pencil eraser.  Always use sunscreen. Apply sunscreen liberally and repeat throughout the day.  Protect yourself by wearing long sleeves, pants, a wide-brimmed hat, and  sunglasses when outside.

## 2017-09-13 ENCOUNTER — Ambulatory Visit
Admission: RE | Admit: 2017-09-13 | Discharge: 2017-09-13 | Disposition: A | Discharge status: Home / Self Care | Payer: Self-pay | Source: Ambulatory Visit | Attending: Medical | Admitting: Medical

## 2017-09-13 ENCOUNTER — Other Ambulatory Visit: Payer: Self-pay | Admitting: Medical

## 2017-09-13 DIAGNOSIS — Z720 Tobacco use: Secondary | ICD-10-CM

## 2017-09-13 DIAGNOSIS — R079 Chest pain, unspecified: Secondary | ICD-10-CM

## 2017-09-13 DIAGNOSIS — I739 Peripheral vascular disease, unspecified: Secondary | ICD-10-CM

## 2017-09-13 LAB — T4, FREE: FREE T4: 1.54 ng/dL (ref 0.82–1.77)

## 2017-09-13 LAB — CBC WITH DIFFERENTIAL/PLATELET
BASOS: 0 %
Basophils Absolute: 0 10*3/uL (ref 0.0–0.2)
EOS (ABSOLUTE): 0.3 10*3/uL (ref 0.0–0.4)
Eos: 3 %
HEMATOCRIT: 46.6 % (ref 37.5–51.0)
Hemoglobin: 16.2 g/dL (ref 13.0–17.7)
IMMATURE GRANS (ABS): 0 10*3/uL (ref 0.0–0.1)
IMMATURE GRANULOCYTES: 0 %
Lymphocytes Absolute: 3.7 10*3/uL — ABNORMAL HIGH (ref 0.7–3.1)
Lymphs: 41 %
MCH: 32.3 pg (ref 26.6–33.0)
MCHC: 34.8 g/dL (ref 31.5–35.7)
MCV: 93 fL (ref 79–97)
MONOS ABS: 0.5 10*3/uL (ref 0.1–0.9)
Monocytes: 5 %
NEUTROS PCT: 51 %
Neutrophils Absolute: 4.5 10*3/uL (ref 1.4–7.0)
PLATELETS: 250 10*3/uL (ref 150–379)
RBC: 5.01 x10E6/uL (ref 4.14–5.80)
RDW: 13.7 % (ref 12.3–15.4)
WBC: 9 10*3/uL (ref 3.4–10.8)

## 2017-09-13 LAB — COMPREHENSIVE METABOLIC PANEL
A/G RATIO: 1.4 (ref 1.2–2.2)
ALK PHOS: 69 IU/L (ref 39–117)
ALT: 19 IU/L (ref 0–44)
AST: 19 IU/L (ref 0–40)
Albumin: 4.5 g/dL (ref 3.5–5.5)
BILIRUBIN TOTAL: 0.5 mg/dL (ref 0.0–1.2)
BUN/Creatinine Ratio: 15 (ref 9–20)
BUN: 12 mg/dL (ref 6–24)
CALCIUM: 9.9 mg/dL (ref 8.7–10.2)
CO2: 24 mmol/L (ref 20–29)
Chloride: 101 mmol/L (ref 96–106)
Creatinine, Ser: 0.78 mg/dL (ref 0.76–1.27)
GFR calc Af Amer: 118 mL/min/{1.73_m2} (ref >59)
GFR, EST NON AFRICAN AMERICAN: 102 mL/min/{1.73_m2} (ref >59)
GLOBULIN, TOTAL: 3.2 g/dL (ref 1.5–4.5)
Glucose: 100 mg/dL — ABNORMAL HIGH (ref 65–99)
POTASSIUM: 4.9 mmol/L (ref 3.5–5.2)
SODIUM: 142 mmol/L (ref 134–144)
Total Protein: 7.7 g/dL (ref 6.0–8.5)

## 2017-09-13 LAB — LIPID PANEL
Chol/HDL Ratio: 5.1 ratio — ABNORMAL HIGH (ref 0.0–5.0)
Cholesterol, Total: 245 mg/dL — ABNORMAL HIGH (ref 100–199)
HDL: 48 mg/dL (ref >39)
LDL Calculated: 180 mg/dL — ABNORMAL HIGH (ref 0–99)
Triglycerides: 86 mg/dL (ref 0–149)
VLDL CHOLESTEROL CAL: 17 mg/dL (ref 5–40)

## 2017-09-13 LAB — HEMOGLOBIN A1C
Est. average glucose Bld gHb Est-mCnc: 114 mg/dL
Hgb A1c MFr Bld: 5.6 % (ref 4.8–5.6)

## 2017-09-13 LAB — TSH: TSH: 0.534 u[IU]/mL (ref 0.450–4.500)

## 2017-09-13 LAB — HEPATITIS C ANTIBODY: Hep C Virus Ab: <0.1 s/co ratio (ref 0.0–0.9)

## 2017-09-13 LAB — PSA: Prostate Specific Ag, Serum: 0.5 ng/mL (ref 0.0–4.0)

## 2017-09-13 MED ORDER — TAMSULOSIN HCL 0.4 MG PO CAPS: 0.4000 mg | ORAL_CAPSULE | Freq: Every day | ORAL | 3 refills | Status: DC

## 2017-09-13 MED ORDER — ROSUVASTATIN CALCIUM 40 MG PO TABS: 40.0000 mg | ORAL_TABLET | Freq: Every day | ORAL | 3 refills | Status: DC

## 2017-09-13 MED ORDER — ASPIRIN 81 MG PO TBEC: DELAYED_RELEASE_TABLET | ORAL | 3 refills | Status: DC

## 2017-10-05 ENCOUNTER — Encounter: Payer: Self-pay | Admitting: Internal Medicine

## 2017-11-03 ENCOUNTER — Encounter: Payer: Self-pay | Admitting: Medical

## 2018-01-26 ENCOUNTER — Ambulatory Visit: Payer: 59 | Admitting: Medical

## 2018-01-26 VITALS — BP 120/76 | HR 74 | Temp 98.6°F | Resp 16 | Ht 67.25 in | Wt 165.6 lb

## 2018-01-26 DIAGNOSIS — M62838 Other muscle spasm: Secondary | ICD-10-CM | POA: Diagnosis not present

## 2018-01-26 DIAGNOSIS — S39012A Strain of muscle, fascia and tendon of lower back, initial encounter: Secondary | ICD-10-CM

## 2018-01-26 MED ORDER — NAPROXEN 500 MG PO TABS: 500.0000 mg | ORAL_TABLET | Freq: Two times a day (BID) | ORAL | 0 refills | Status: DC

## 2018-01-26 MED ORDER — CYCLOBENZAPRINE HCL 10 MG PO TABS: 10.0000 mg | ORAL_TABLET | Freq: Every day | ORAL | 0 refills | Status: DC

## 2018-01-26 NOTE — Patient Instructions (Addendum)
Encounter Diagnoses  Name Primary?  . Back strain, initial encounter Yes  . Spasm of muscle     Recommendations: Your symptoms suggest back muscle strain/overstretching of muscle  For the current symptom use the following:  Gentle stretching and gentle range of motion exercise  You can alternate ice and heat such as ice water pack for 20 minutes, then after skin is back to room temperature, can use hot towel or warm moist towel for 20 minutes.    You can do cold/hot therapy the next 3 days  Begin Flexeril 1/2-1 tablet at bedtime or up to twice daily the next few days for spasm and pain  You can use Naprosyn 500mg  1 tablet twice daily with food for pain and inflammation.  Use this for the next several days as need  Regarding your exercise program:  Use aerobic exercise such as walking or elliptical machine 30-60 minutes most days per week  Do circuit weight training at least 2 days per week  For example do 15-20 repetitions of each weight x 2 sets  2 sets of 15-20 repetitions of a medium weight for chest, upper back, lower back, biceps, triceps, shoulders, legs  Do 30-50 sit ups or abdominal crunches several days per week  Ask a staff member, trainer, or fellow gym member that seems to know what they are doing to help you on a particular exercise if you are not sure about the exercise or the machine

## 2018-01-26 NOTE — Progress Notes (Signed)
Subjective: Chief Complaint  Patient presents with  . back pain    lower back pain started working out   Here for back pain.   Started exercise program last month at gym.  He has been going several days a week doing aerobic activity and weights for the past month.  So far he has not had anyone to help him like a trainer.  A few days ago on Tuesday was using new matching, row machine.  The machine was pulling him back on recall.  Later that they started having some low back pain in the middle.  Has had some stiffness and pain the last few days. Yesterday was a little better.  But still sore, some low back pain.  Using heat and ice.  No other aggravating or relieving factors. No other complaint.   Past Medical History:  Diagnosis Date  . Aortic valve insufficiency 12/15/2014   Moderate by ECHO 12/15/14 normal LV function   . Back pain   . Hyperlipidemia   . Hypertension   . Loss of balance   . Peripheral vascular disease (HCC)    Current Outpatient Medications on File Prior to Visit  Medication Sig Dispense Refill  . aspirin (ASPIRIN ADULT LOW STRENGTH) 81 MG EC tablet TAKE 1 TABLET (81 MG TOTAL) BY MOUTH DAILY. 90 tablet 3  . lisinopril (PRINIVIL,ZESTRIL) 10 MG tablet Take 1 tablet (10 mg total) by mouth daily. 90 tablet 3  . rosuvastatin (CRESTOR) 40 MG tablet Take 1 tablet (40 mg total) by mouth daily. 90 tablet 3  . tamsulosin (FLOMAX) 0.4 MG CAPS capsule Take 1 capsule (0.4 mg total) by mouth daily. 90 capsule 3   No current facility-administered medications on file prior to visit.    ROS as in subjective   Objective: BP 120/76   Pulse 74   Temp 98.6 F (37 C) (Oral)   Resp 16   Ht 5' 7.25" (1.708 m)   Wt 165 lb 9.6 oz (75.1 kg)   SpO2 95%   BMI 25.74 kg/m   Gen: wd, wn, nad Skin unremarkable Mild lumbar midline tenderness, mild decreased range of motion due to pain but otherwise nontender no deformity, normal range of motion otherwise Legs nontender with normal range of  motion Arms and legs neurovascularly intact    Assessment: Encounter Diagnoses  Name Primary?  . Back strain, initial encounter Yes  . Spasm of muscle      Plan: We discussed his symptoms and concerns to suggest mild back strain.  We discussed usual timeframe to see improvement.  Discussed recommendations below.  Patient Instructions   Encounter Diagnoses  Name Primary?  . Back strain, initial encounter Yes  . Spasm of muscle     Recommendations: Your symptoms suggest back muscle strain/overstretching of muscle  For the current symptom use the following:  Gentle stretching and gentle range of motion exercise  You can alternate ice and heat such as ice water pack for 20 minutes, then after skin is back to room temperature, can use hot towel or warm moist towel for 20 minutes.    You can do cold/hot therapy the next 3 days  Begin Flexeril 1/2-1 tablet at bedtime or up to twice daily the next few days for spasm and pain  You can use Naprosyn 500mg  1 tablet twice daily with food for pain and inflammation.  Use this for the next several days as need  Regarding your exercise program:  Use aerobic exercise such as walking  or elliptical machine 30-60 minutes most days per week  Do circuit weight training at least 2 days per week  For example do 15-20 repetitions of each weight x 2 sets  2 sets of 15-20 repetitions of a medium weight for chest, upper back, lower back, biceps, triceps, shoulders, legs  Do 30-50 sit ups or abdominal crunches several days per week  Ask a staff member, trainer, or fellow gym member that seems to know what they are doing to help you on a particular exercise if you are not sure about the exercise or the machine       [place] was seen today for back pain.  Diagnoses and all orders for this visit:  Back strain, initial encounter  Spasm of muscle  Other orders -     cyclobenzaprine (FLEXERIL) 10 MG tablet; Take 1 tablet (10 mg total)  by mouth at bedtime. 1/2-1 tablet po up to BID prn -     naproxen (NAPROSYN) 500 MG tablet; Take 1 tablet (500 mg total) by mouth 2 (two) times daily with a meal.

## 2018-02-01 ENCOUNTER — Other Ambulatory Visit: Payer: Self-pay | Admitting: Medical

## 2018-02-01 ENCOUNTER — Telehealth: Payer: Self-pay | Admitting: Medical

## 2018-02-01 MED ORDER — NAPROXEN 500 MG PO TABS: 500.0000 mg | ORAL_TABLET | Freq: Two times a day (BID) | ORAL | 0 refills | Status: DC

## 2018-02-01 MED ORDER — CYCLOBENZAPRINE HCL 10 MG PO TABS: 10.0000 mg | ORAL_TABLET | Freq: Every day | ORAL | 0 refills | Status: DC

## 2018-02-01 NOTE — Telephone Encounter (Signed)
Pt advised.

## 2018-02-01 NOTE — Telephone Encounter (Signed)
Medication sent.

## 2018-02-01 NOTE — Telephone Encounter (Signed)
Pt called and stated he is still having pain and all the medication is gone. He is requesting refills on Naprosyn. Pt uses [facility] on [address] and can be reached at [phone].

## 2018-02-07 ENCOUNTER — Ambulatory Visit: Payer: No Typology Code available for payment source | Admitting: Cardiology

## 2018-07-23 ENCOUNTER — Other Ambulatory Visit: Payer: Self-pay | Admitting: Medical

## 2018-07-24 ENCOUNTER — Other Ambulatory Visit: Payer: Self-pay | Admitting: Medical

## 2018-07-24 NOTE — Telephone Encounter (Signed)
Called patient he did not want to schedule appointment today.  He states he will call tomorrow to schedule.

## 2018-07-26 ENCOUNTER — Other Ambulatory Visit: Payer: Self-pay | Admitting: Medical

## 2018-07-31 ENCOUNTER — Encounter: Payer: 59 | Admitting: Medical

## 2018-07-31 LAB — HM DIABETES EYE EXAM

## 2018-08-01 ENCOUNTER — Ambulatory Visit: Payer: 59 | Admitting: Medical

## 2018-08-01 ENCOUNTER — Encounter: Payer: Self-pay | Admitting: Medical

## 2018-08-01 ENCOUNTER — Other Ambulatory Visit: Payer: Self-pay

## 2018-08-01 VITALS — BP 146/86 | HR 78 | Temp 98.1°F | Wt 170.2 lb

## 2018-08-01 DIAGNOSIS — I10 Essential (primary) hypertension: Secondary | ICD-10-CM | POA: Diagnosis not present

## 2018-08-01 DIAGNOSIS — I351 Nonrheumatic aortic (valve) insufficiency: Secondary | ICD-10-CM

## 2018-08-01 DIAGNOSIS — R0989 Other specified symptoms and signs involving the circulatory and respiratory systems: Secondary | ICD-10-CM

## 2018-08-01 DIAGNOSIS — Z720 Tobacco use: Secondary | ICD-10-CM | POA: Diagnosis not present

## 2018-08-01 DIAGNOSIS — R7989 Other specified abnormal findings of blood chemistry: Secondary | ICD-10-CM

## 2018-08-01 DIAGNOSIS — I739 Peripheral vascular disease, unspecified: Secondary | ICD-10-CM

## 2018-08-01 DIAGNOSIS — R7301 Impaired fasting glucose: Secondary | ICD-10-CM

## 2018-08-01 DIAGNOSIS — E785 Hyperlipidemia, unspecified: Secondary | ICD-10-CM

## 2018-08-01 LAB — POCT GLYCOSYLATED HEMOGLOBIN (HGB A1C): Hemoglobin A1C: 5.8 % — AB (ref 4.0–5.6)

## 2018-08-01 NOTE — Progress Notes (Signed)
Subjective:     Patient ID: Terry Gross, male   DOB: 31-Jan-1966, 53 y.o.   MRN: [id number]  HPI Chief Complaint  Patient presents with  . Other    med check / dibetes concern and questions   Cardiology - prior with Dr. Viann Gross Endocrinology - prior with Dr. Reather Gross Vascular - prior with Dr. Ricki Gross, Terry Gross here for primary care  Concerns: Here for concerns   Recently had some floaters in eye, sudden change in left eye vision.   Couldn't find eye doctor initially given coronavirus scare. Finally saw eye doctor yesterday, Dr. Nile Gross, diagnosed with retinal detachment.   Will need laser therapy, but advised to come here to screen for diabetes.    Medications Compliant with Aspirin 81mg  daily Lisinopril 10mg  daily Crestor 40mg  daily  HTN - checks BPs, gets 110/70s on average.  He attribute the high reading to stress.  Wants to have diabetes screen.  Still smoking, 1/2 ppd.   No leg pain, no pain with walking, no chest pain, no SOB.  No polydipsia.    otherwise in normal state of health.    Past Medical History:  Diagnosis Date  . Aortic valve insufficiency 12/15/2014   Moderate by ECHO 12/15/14 normal LV function   . Back pain   . Hyperlipidemia   . Hypertension   . Loss of balance   . Peripheral vascular disease (HCC)    Current Outpatient Medications on File Prior to Visit  Medication Sig Dispense Refill  . aspirin (ASPIRIN ADULT LOW STRENGTH) 81 MG EC tablet TAKE 1 TABLET (81 MG TOTAL) BY MOUTH DAILY. 90 tablet 3  . cyclobenzaprine (FLEXERIL) 10 MG tablet Take 1 tablet (10 mg total) by mouth at bedtime. 1/2-1 tablet po up to BID prn 10 tablet 0  . lisinopril (PRINIVIL,ZESTRIL) 10 MG tablet Take 1 tablet (10 mg total) by mouth daily. 90 tablet 3  . rosuvastatin (CRESTOR) 40 MG tablet Take 1 tablet (40 mg total) by mouth daily. 90 tablet 3  . tamsulosin (FLOMAX) 0.4 MG CAPS capsule Take 1 capsule (0.4 mg total) by mouth daily. 90  capsule 3  . naproxen (NAPROSYN) 500 MG tablet Take 1 tablet (500 mg total) by mouth 2 (two) times daily with a meal. (Patient not taking: Reported on 08/01/2018) 20 tablet 0   No current facility-administered medications on file prior to visit.     Review of Systems As in subjective     Objective:   Physical Exam BP (!) 146/86 (BP Location: Left Arm, Patient Position: Sitting)   Pulse 78   Temp 98.1 F (36.7 C)   Wt 170 lb 3.2 oz (77.2 kg)   SpO2 96%   BMI 26.46 kg/m   BP Readings from Last 3 Encounters:  08/01/18 (!) 146/86  01/26/18 120/76  09/12/17 108/60   Wt Readings from Last 3 Encounters:  08/01/18 170 lb 3.2 oz (77.2 kg)  01/26/18 165 lb 9.6 oz (75.1 kg)  09/12/17 155 lb (70.3 kg)   General appearance: alert, no distress, WD/WN,  Neck: supple, no lymphadenopathy, no thyromegaly, no masses, +bruits heard in neck bilat Heart: 2-3/6 systolic murmur heard in upper sternal borders, otherwise RRR, normal S1, S2 Lungs: CTA bilaterally, no wheezes, rhonchi, or rales Abdomen: +bs, soft, non tender, non distended, no masses, no hepatomegaly, no splenomegaly,no bruits over aorta Pulses: 2+ symmetric, upper ext, 1+ right lower ext pulse ,but left pedal pulse barely palpable Ext: no edema, no cyanosis,  nontender calves      Assessment:     Encounter Diagnoses  Name Primary?  . Essential hypertension Yes  . Hyperlipidemia, unspecified hyperlipidemia type   . Tobacco abuse   . Peripheral vascular disease with claudication (HCC)   . Impaired fasting blood sugar   . Abnormal thyroid blood test   . Aortic valve insufficiency, etiology of cardiac valve disease unspecified   . Bilateral carotid bruits        Plan:     We discussed his diagnoses.  I reviewed the recent eye doctor visit note in the chart.  We will screen for diabetes today.  He has been borderline in the past.  Blood pressure on triage was too high today.  He reports home blood pressure readings being  normal.  He has not had ultrasound or vascular screening in several years.  We will go ahead and work on updated echocardiogram and carotid ultrasound.  He sees Dr. Fawn Gross, retinal specialist tomorrow  Patient Instructions   Encounter Diagnoses  Name Primary?  . Essential hypertension Yes  . Hyperlipidemia, unspecified hyperlipidemia type   . Tobacco abuse   . Peripheral vascular disease with claudication (HCC)   . Impaired fasting blood sugar   . Abnormal thyroid blood test   . Aortic valve insufficiency, etiology of cardiac valve disease unspecified   . Bilateral carotid bruits     Recommendations  Follow-up with the eye doctor as planned  You have a history of valve disease in your aortic valve as well as blood flow decreased in your left leg.  I also hear sounds suggestive of decreased blood flow in your carotid arteries in your neck.  It is time for updated ultrasound of your heart and a screening ultrasound of your neck arteries.  We will set this up.  Given the coronavirus care, this may be pushed back a month or so  High blood pressure- you report home readings being normal blood pressure reading today was elevated.  Please check your blood pressure 3 times a week for the next 2 weeks and get me readings please  Tobacco use-advised to quit tobacco completely as this worsens your vascular disease as well as increases your risk of cancer.  I recommend you call the 1 800 quit now hotline for free counseling on helping quit tobacco  Impaired glucose-your diabetes screen today was normal, and we will also check your blood sugar reading today.  We will call results  In the past your thyroid labs have been abnormal-we will repeat them today  High cholesterol-continue Crestor to reduce cholesterol and reduce risk of heart disease and stroke     Terry Gross was seen today for other.  Diagnoses and all orders for this visit:  Essential hypertension -     Comprehensive  metabolic panel -     CBC -     ECHOCARDIOGRAM COMPLETE; Future  Hyperlipidemia, unspecified hyperlipidemia type -     Comprehensive metabolic panel -     CBC -     Lipid panel  Tobacco abuse  Peripheral vascular disease with claudication (HCC) -     CBC -     Lipid panel  Impaired fasting blood sugar -     POCT glycosylated hemoglobin (Hb A1C)  Abnormal thyroid blood test -     CBC -     TSH -     T3 -     T4, free  Aortic valve insufficiency, etiology of cardiac valve  disease unspecified -     ECHOCARDIOGRAM COMPLETE; Future  Bilateral carotid bruits -     VAS US CAROTID; Future

## 2018-08-01 NOTE — Patient Instructions (Addendum)
Encounter Diagnoses  Name Primary?  . Essential hypertension Yes  . Hyperlipidemia, unspecified hyperlipidemia type   . Tobacco abuse   . Peripheral vascular disease with claudication (HCC)   . Impaired fasting blood sugar   . Abnormal thyroid blood test   . Aortic valve insufficiency, etiology of cardiac valve disease unspecified   . Bilateral carotid bruits     Recommendations  Follow-up with the eye doctor as planned  You have a history of valve disease in your aortic valve as well as blood flow decreased in your left leg.  I also hear sounds suggestive of decreased blood flow in your carotid arteries in your neck.  It is time for updated ultrasound of your heart and a screening ultrasound of your neck arteries.  We will set this up.  Given the coronavirus care, this may be pushed back a month or so  High blood pressure- you report home readings being normal blood pressure reading today was elevated.  Please check your blood pressure 3 times a week for the next 2 weeks and get me readings please  Tobacco use-advised to quit tobacco completely as this worsens your vascular disease as well as increases your risk of cancer.  I recommend you call the 1 800 quit now hotline for free counseling on helping quit tobacco  Impaired glucose-your diabetes screen today was normal, and we will also check your blood sugar reading today.  We will call results  In the past your thyroid labs have been abnormal-we will repeat them today  High cholesterol-continue Crestor to reduce cholesterol and reduce risk of heart disease and stroke

## 2018-08-02 ENCOUNTER — Other Ambulatory Visit: Payer: Self-pay | Admitting: Medical

## 2018-08-02 DIAGNOSIS — R7989 Other specified abnormal findings of blood chemistry: Secondary | ICD-10-CM

## 2018-08-02 DIAGNOSIS — R945 Abnormal results of liver function studies: Principal | ICD-10-CM

## 2018-08-02 LAB — COMPREHENSIVE METABOLIC PANEL
ALBUMIN: 4.7 g/dL (ref 3.8–4.9)
ALK PHOS: 68 IU/L (ref 39–117)
ALT: 59 IU/L — ABNORMAL HIGH (ref 0–44)
AST: 31 IU/L (ref 0–40)
Albumin/Globulin Ratio: 1.7 (ref 1.2–2.2)
BUN / CREAT RATIO: 13 (ref 9–20)
BUN: 10 mg/dL (ref 6–24)
Bilirubin Total: 0.6 mg/dL (ref 0.0–1.2)
CO2: 22 mmol/L (ref 20–29)
Calcium: 9.6 mg/dL (ref 8.7–10.2)
Chloride: 100 mmol/L (ref 96–106)
Creatinine, Ser: 0.75 mg/dL — ABNORMAL LOW (ref 0.76–1.27)
GFR calc Af Amer: 119 mL/min/{1.73_m2} (ref >59)
GFR calc non Af Amer: 103 mL/min/{1.73_m2} (ref >59)
Globulin, Total: 2.7 g/dL (ref 1.5–4.5)
Glucose: 103 mg/dL — ABNORMAL HIGH (ref 65–99)
Potassium: 4.9 mmol/L (ref 3.5–5.2)
Sodium: 138 mmol/L (ref 134–144)
Total Protein: 7.4 g/dL (ref 6.0–8.5)

## 2018-08-02 LAB — CBC
HEMATOCRIT: 46.3 % (ref 37.5–51.0)
Hemoglobin: 16 g/dL (ref 13.0–17.7)
MCH: 31.4 pg (ref 26.6–33.0)
MCHC: 34.6 g/dL (ref 31.5–35.7)
MCV: 91 fL (ref 79–97)
Platelets: 258 10*3/uL (ref 150–450)
RBC: 5.09 x10E6/uL (ref 4.14–5.80)
RDW: 12.7 % (ref 11.6–15.4)
WBC: 7.9 10*3/uL (ref 3.4–10.8)

## 2018-08-02 LAB — LIPID PANEL
CHOL/HDL RATIO: 6.4 ratio — AB (ref 0.0–5.0)
Cholesterol, Total: 255 mg/dL — ABNORMAL HIGH (ref 100–199)
HDL: 40 mg/dL (ref >39)
LDL Calculated: 191 mg/dL — ABNORMAL HIGH (ref 0–99)
Triglycerides: 118 mg/dL (ref 0–149)
VLDL Cholesterol Cal: 24 mg/dL (ref 5–40)

## 2018-08-02 LAB — T3: T3, Total: 122 ng/dL (ref 71–180)

## 2018-08-02 LAB — TSH: TSH: 0.435 u[IU]/mL — ABNORMAL LOW (ref 0.450–4.500)

## 2018-08-02 LAB — T4, FREE: FREE T4: 1.65 ng/dL (ref 0.82–1.77)

## 2018-08-02 MED ORDER — LISINOPRIL 10 MG PO TABS: 10.0000 mg | ORAL_TABLET | Freq: Every day | ORAL | 3 refills | Status: DC

## 2018-08-02 MED ORDER — ASPIRIN 81 MG PO TBEC: DELAYED_RELEASE_TABLET | ORAL | 3 refills | Status: DC

## 2018-08-04 LAB — HEPATITIS PANEL, ACUTE
HEP A IGM: NEGATIVE
Hep B C IgM: NEGATIVE
Hep C Virus Ab: <0.1 s/co ratio (ref 0.0–0.9)
Hepatitis B Surface Ag: NEGATIVE

## 2018-08-04 LAB — SPECIMEN STATUS REPORT

## 2018-08-07 ENCOUNTER — Telehealth: Payer: Self-pay | Admitting: Medical

## 2018-08-07 ENCOUNTER — Encounter: Payer: Self-pay | Admitting: Medical

## 2018-08-07 ENCOUNTER — Encounter: Payer: 59 | Admitting: Medical

## 2018-08-07 NOTE — Telephone Encounter (Signed)
Pt called and wants to know if he should continue to take his cholesterol medication? Pt also wanted to know if his cardiologist appt can be moved up sooner than May 18th. I advised pt that is probably how they are doing schedules now due to the [place] virus. Pt can be reached [phone]

## 2018-08-07 NOTE — Telephone Encounter (Signed)
Hold off on taking cholesterol medication for now given liver findings.   Continue plan for doing the carotid US and echocardiogram in May, abdominal ultrasound in May as well.  I think this is ok to wait until may.

## 2018-08-08 NOTE — Telephone Encounter (Signed)
Left message on voicemail for patient to call back. 

## 2018-08-08 NOTE — Telephone Encounter (Signed)
Patient notified

## 2018-09-03 ENCOUNTER — Telehealth: Payer: Self-pay | Admitting: Medical

## 2018-09-03 NOTE — Telephone Encounter (Signed)
He already has appointment for Echo scheduled at cardiology in May

## 2018-09-03 NOTE — Telephone Encounter (Signed)
Glad to hear.   We still are planning to get echocardiogram/ heart ultrasound once imaging center opens back up for routine testing.   Have him call back in 3-4 weeks to see if we can move forward with other recommendations discussed last visit.

## 2018-09-03 NOTE — Telephone Encounter (Signed)
Pt called and wanted to let you know that his blood pressure have been running 117/76 102/76 110/76 117/80 states they have not been over 120, states they have been running good pt can be reached at [phone]

## 2018-09-07 ENCOUNTER — Telehealth: Payer: Self-pay

## 2018-09-07 NOTE — Telephone Encounter (Signed)
Patient has stopped his cholesterol medication and wants to know if he needs to go back on it?   Patient also states that his BP is running good 117/76, 102/76, 110/76 and 117/80.  He states that you asked him to let you know his reading at home.   Please call patient back.

## 2018-09-10 NOTE — Telephone Encounter (Signed)
I am glad his blood pressure numbers are looking pretty good.  No, do not restart the cholesterol medicine until we get the abdominal ultrasound.  Please make sure we have the ultrasound of the abdomen scheduled

## 2018-09-11 NOTE — Telephone Encounter (Signed)
Left message on voicemail for patient to call back. 

## 2018-09-11 NOTE — Telephone Encounter (Signed)
Patient notified

## 2018-09-24 ENCOUNTER — Ambulatory Visit (HOSPITAL_COMMUNITY)
Admission: RE | Admit: 2018-09-24 | Discharge: 2018-09-24 | Disposition: A | Discharge status: Home / Self Care | Payer: 59 | Source: Ambulatory Visit | Attending: Medical | Admitting: Medical

## 2018-09-24 ENCOUNTER — Ambulatory Visit (HOSPITAL_BASED_OUTPATIENT_CLINIC_OR_DEPARTMENT_OTHER)
Admission: RE | Admit: 2018-09-24 | Discharge: 2018-09-24 | Disposition: A | Discharge status: Home / Self Care | Payer: 59 | Source: Ambulatory Visit | Attending: Medical | Admitting: Medical

## 2018-09-24 ENCOUNTER — Other Ambulatory Visit: Payer: Self-pay

## 2018-09-24 DIAGNOSIS — R0989 Other specified symptoms and signs involving the circulatory and respiratory systems: Secondary | ICD-10-CM | POA: Insufficient documentation

## 2018-09-24 DIAGNOSIS — I10 Essential (primary) hypertension: Secondary | ICD-10-CM

## 2018-09-24 DIAGNOSIS — I351 Nonrheumatic aortic (valve) insufficiency: Secondary | ICD-10-CM

## 2018-09-24 DIAGNOSIS — E785 Hyperlipidemia, unspecified: Secondary | ICD-10-CM | POA: Insufficient documentation

## 2018-09-24 DIAGNOSIS — I352 Nonrheumatic aortic (valve) stenosis with insufficiency: Secondary | ICD-10-CM | POA: Insufficient documentation

## 2018-09-24 NOTE — Progress Notes (Signed)
  Echocardiogram 2D Echocardiogram has been performed.  Terry Gross 09/24/2018, 10:58 AM

## 2018-09-24 NOTE — Progress Notes (Signed)
Carotid duplex       has been completed. Preliminary results can be found under CV proc through chart review. Randall Rampersad, BS, RDMS, RVT   

## 2018-09-28 ENCOUNTER — Ambulatory Visit (INDEPENDENT_AMBULATORY_CARE_PROVIDER_SITE_OTHER): Payer: 59 | Admitting: Medical

## 2018-09-28 ENCOUNTER — Other Ambulatory Visit: Payer: Self-pay

## 2018-09-28 ENCOUNTER — Encounter: Payer: Self-pay | Admitting: Medical

## 2018-09-28 VITALS — BP 110/70 | HR 68 | Temp 97.7°F | Resp 16 | Ht 68.0 in | Wt 155.6 lb

## 2018-09-28 DIAGNOSIS — I6523 Occlusion and stenosis of bilateral carotid arteries: Secondary | ICD-10-CM

## 2018-09-28 DIAGNOSIS — I351 Nonrheumatic aortic (valve) insufficiency: Secondary | ICD-10-CM

## 2018-09-28 DIAGNOSIS — I10 Essential (primary) hypertension: Secondary | ICD-10-CM

## 2018-09-28 DIAGNOSIS — R0989 Other specified symptoms and signs involving the circulatory and respiratory systems: Secondary | ICD-10-CM

## 2018-09-28 DIAGNOSIS — Z720 Tobacco use: Secondary | ICD-10-CM

## 2018-09-28 DIAGNOSIS — H348122 Central retinal vein occlusion, left eye, stable: Secondary | ICD-10-CM

## 2018-09-28 DIAGNOSIS — R7989 Other specified abnormal findings of blood chemistry: Secondary | ICD-10-CM

## 2018-09-28 DIAGNOSIS — R7301 Impaired fasting glucose: Secondary | ICD-10-CM | POA: Diagnosis not present

## 2018-09-28 DIAGNOSIS — R748 Abnormal levels of other serum enzymes: Secondary | ICD-10-CM

## 2018-09-28 DIAGNOSIS — E785 Hyperlipidemia, unspecified: Secondary | ICD-10-CM

## 2018-09-28 DIAGNOSIS — I6529 Occlusion and stenosis of unspecified carotid artery: Secondary | ICD-10-CM | POA: Insufficient documentation

## 2018-09-28 DIAGNOSIS — H34812 Central retinal vein occlusion, left eye, with macular edema: Secondary | ICD-10-CM | POA: Insufficient documentation

## 2018-09-28 DIAGNOSIS — I739 Peripheral vascular disease, unspecified: Secondary | ICD-10-CM | POA: Insufficient documentation

## 2018-09-28 NOTE — Patient Instructions (Signed)
Unfortunately your carotid arteries showed blockages.  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  We are referring you back to vascular surgery for this  Continue your blood pressure medicines and aspirin daily.  I really need you to stop smoking immediately.  I recommend you call 1 800 quit now hotline to help stop smoking.  We can also use medications like Wellbutrin to help stop smoking.  You could also use nicotine patches over-the-counter to help stop smoking.  Let me know if you want to try Wellbutrin  Tobacco use further makes the cholesterol buildup in your arteries  You really need to be back on your cholesterol medicine but we stopped this temporarily to figure out what is going on with the liver test  Your liver test is elevated.  Causes can include alcohol, medication side effect, fatty liver disease, autoimmune disease, iron overload disease or other.  We are checking iron and ferritin today.  If these are normal I would like to get an ultrasound of your abdomen to further evaluate the elevated liver test  Follow-up with Dr. Luciana Axe on the issue  Your blood sugars have been borderline for diabetes so please be careful not to eat too much grains or breads and avoid high sugar foods and sweets.

## 2018-09-28 NOTE — Progress Notes (Signed)
Subjective:  Terry Gross is a 53 y.o. male who presents for Chief Complaint  Patient presents with  . consult    consult     Today to follow-up on recent labs and ultrasounds.  I had him stop his statin recently due to elevated liver test.  He is here to follow-up on the liver test.  He continues to smoke but wants to quit.  He is compliant with medicine other than stopping the recent statin.  He saw me recently with eye issues and he just saw Dr. Luciana Axeankin in follow-up yesterday.  At the time he was told everything looked better after the recent procedure but he says today he cannot see out of the left eye much at all.  He tried to call the office today and cannot get them on the phone.  No other aggravating or relieving factors.    No other c/o.  The following portions of the patient's history were reviewed and updated as appropriate: allergies, current medications, past family history, past medical history, past social history, past surgical history and problem list.  ROS Otherwise as in subjective above  Objective: BP 110/70   Pulse 68   Temp 97.7 F (36.5 C) (Temporal)   Resp 16   Ht 5\' 8"  (1.727 m)   Wt 155 lb 9.6 oz (70.6 kg)   SpO2 98%   BMI 23.66 kg/m   General appearance: alert, no distress, well developed, well nourished    Assessment: Encounter Diagnoses  Name Primary?  . 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 Aortic valve insufficiency, etiology of cardiac valve disease unspecified   . Essential hypertension   . Impaired fasting blood sugar   . Abnormal thyroid blood test   . Bilateral carotid bruits   . Hyperlipidemia, unspecified hyperlipidemia type   . Tobacco abuse   . Elevated liver enzymes   . Retinal vein occlusion of left eye, unspecified retinal vein   . PVD (peripheral vascular disease) (HCC)      Plan: We discussed his findings and concerns.  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  He had 60 to 79% stenosis of the right internal carotid artery.  His echocardiogram showed aortic stenosis and insufficiency.  We discussed the serious nature of this.  We will refer him back to vascular surgery.  I advised that he has to quit smoking.  I advised he continue aspirin.  We temporarily stop the statin due to elevated liver test.  We are further evaluating this today, but he does need to be back on a statin.  Discussed importance of exercise, and he will follow-up with vascular surgery  Elevated liver test- recent labs showed negative hepatitis.  We will check an iron and ferritin today to evaluate for hemochromatosis.  He is not drinking alcohol at all.  I suspect this could be fatty liver disease.  We have an ultrasound ordered of the abdomen but the carotid issue will take priority at this time  Hypertension-continue current medication  Retinal vein occlusion- advised to follow-up with eye doctor  Terry Gross was seen today for consult.  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

## 2018-09-29 LAB — HEPATIC FUNCTION PANEL
ALT: 19 IU/L (ref 0–44)
AST: 17 IU/L (ref 0–40)
Albumin: 4.5 g/dL (ref 3.8–4.9)
Alkaline Phosphatase: 64 IU/L (ref 39–117)
Bilirubin Total: 0.4 mg/dL (ref 0.0–1.2)
Bilirubin, Direct: 0.11 mg/dL (ref 0.00–0.40)
Total Protein: 7 g/dL (ref 6.0–8.5)

## 2018-09-29 LAB — IRON: Iron: 129 ug/dL (ref 38–169)

## 2018-09-29 LAB — FERRITIN: Ferritin: 106 ng/mL (ref 30–400)

## 2018-10-04 ENCOUNTER — Other Ambulatory Visit: Payer: Self-pay

## 2018-10-04 DIAGNOSIS — R0989 Other specified symptoms and signs involving the circulatory and respiratory systems: Secondary | ICD-10-CM

## 2018-10-08 ENCOUNTER — Telehealth (HOSPITAL_COMMUNITY): Payer: Self-pay | Admitting: Rehabilitation

## 2018-10-08 NOTE — Telephone Encounter (Signed)

## 2018-10-09 ENCOUNTER — Ambulatory Visit (HOSPITAL_COMMUNITY)
Admission: RE | Admit: 2018-10-09 | Discharge: 2018-10-09 | Disposition: A | Discharge status: Home / Self Care | Payer: 59 | Source: Ambulatory Visit | Attending: Vascular Surgery | Admitting: Vascular Surgery

## 2018-10-09 ENCOUNTER — Other Ambulatory Visit: Payer: Self-pay

## 2018-10-09 ENCOUNTER — Encounter: Payer: Self-pay | Admitting: Vascular Surgery

## 2018-10-09 ENCOUNTER — Ambulatory Visit (INDEPENDENT_AMBULATORY_CARE_PROVIDER_SITE_OTHER): Payer: 59 | Admitting: Vascular Surgery

## 2018-10-09 VITALS — BP 93/62 | HR 66 | Temp 97.9°F | Resp 20 | Ht 68.0 in | Wt 151.2 lb

## 2018-10-09 DIAGNOSIS — I6523 Occlusion and stenosis of bilateral carotid arteries: Secondary | ICD-10-CM

## 2018-10-09 DIAGNOSIS — R0989 Other specified symptoms and signs involving the circulatory and respiratory systems: Secondary | ICD-10-CM | POA: Diagnosis not present

## 2018-10-09 NOTE — Progress Notes (Signed)
Vascular and Vein Specialist of [facility]  Patient name: Terry Gross MRN: [id number] DOB: 05-Jun-1965 Sex: male  REASON FOR CONSULT: Evaluation carotid disease  HPI: Terry Gross is a 53 y.o. male, who is in today for discussion of recent following on his carotid duplex.  Seen him in 2005 for discussion of mild claudication symptoms.  He reports that this is non-limiting to him.  He reported some visual changes and has seen Dr. Luciana Axe.  According to the patient, it sounds as though he had some retinal hemorrhage that was treated.  He continues to have some difficulty with his left eye vision.  This was coming on gradually over the last several months.  He denies any episodes of a aphasia or TIA or stroke.  He is right-handed.  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  He is here for further discussion of this.  Past Medical History:  Diagnosis Date  . Aortic valve insufficiency 12/15/2014   Moderate by ECHO 12/15/14 normal LV function   . Back pain   . Carotid artery occlusion   . Hyperlipidemia   . Hypertension   . Loss of balance   . Peripheral vascular disease (HCC)     Family History  Problem Relation Age of Onset  . CVA Mother   . Other Father        complications from prostate sugery  . Cancer Father        prostate  . Thyroid disease Neg Hx   . Heart disease Neg Hx   . Diabetes Neg Hx   . Kidney disease Neg Hx   . Hypertension Neg Hx   . Hyperlipidemia Neg Hx     SOCIAL HISTORY: Social History   Socioeconomic History  . Marital status: Married    Spouse name: Not on file  . Number of children: Not on file  . Years of education: Not on file  . Highest education level: Not on file  Occupational History  . Not on file  Social Needs  . Financial resource strain: Not on file  . Food insecurity:    Worry: Not on  file    Inability: Not on file  . Transportation needs:    Medical: Not on file    Non-medical: Not on file  Tobacco Use  . Smoking status: Current Every Day Smoker    Packs/day: 0.50    Years: 32.00    Pack years: 16.00    Types: Cigarettes  . Smokeless tobacco: Never Used  Substance and Sexual Activity  . Alcohol use: No  . Drug use: No  . Sexual activity: Not on file  Lifestyle  . Physical activity:    Days per week: Not on file    Minutes per session: Not on file  . Stress: Not on file  Relationships  . Social connections:    Talks on phone: Not on file    Gets together: Not on file    Attends religious service: Not on file    Active member of club or organization: Not on file    Attends meetings of clubs or organizations: Not on file    Relationship status: Not on file  . Intimate partner violence:    Fear of current or ex partner: Not on file    Emotionally abused: Not on file    Physically abused: Not on file    Forced sexual  activity: Not on file  Other Topics Concern  . Not on file  Social History Narrative   Married, no children.   Unemployed as of 09/2017.   Exercise some with calisthenics.    From [place] originally.   09/2017.     No Known Allergies  Current Outpatient Medications  Medication Sig Dispense Refill  . aspirin (ASPIRIN ADULT LOW STRENGTH) 81 MG EC tablet TAKE 1 TABLET (81 MG TOTAL) BY MOUTH DAILY. 90 tablet 3  . lisinopril (PRINIVIL,ZESTRIL) 10 MG tablet Take 1 tablet (10 mg total) by mouth daily. 90 tablet 3  . tamsulosin (FLOMAX) 0.4 MG CAPS capsule Take 1 capsule (0.4 mg total) by mouth daily. 90 capsule 3   No current facility-administered medications for this visit.     REVIEW OF SYSTEMS:  [X]  denotes positive finding, [ ]  denotes negative finding Cardiac  Comments:  Chest pain or chest pressure:    Shortness of breath upon exertion:    Short of breath when lying flat:    Irregular heart rhythm:        Vascular    Pain in calf,  thigh, or hip brought on by ambulation:    Pain in feet at night that wakes you up from your sleep:     Blood clot in your veins:    Leg swelling:         Pulmonary    Oxygen at home:    Productive cough:     Wheezing:         Neurologic    Sudden weakness in arms or legs:     Sudden numbness in arms or legs:     Sudden onset of difficulty speaking or slurred speech:    Temporary loss of vision in one eye:  x   Problems with dizziness:         Gastrointestinal    Blood in stool:     Vomited blood:         Genitourinary    Burning when urinating:     Blood in urine:        Psychiatric    Major depression:         Hematologic    Bleeding problems:    Problems with blood clotting too easily:        Skin    Rashes or ulcers:        Constitutional    Fever or chills:      PHYSICAL EXAM: Vitals:   10/09/18 1102 10/09/18 1105  BP: 97/65 93/62  Pulse: 66   Resp: 20   Temp: 97.9 F (36.6 C)   SpO2: 97%   Weight: 151 lb 3.2 oz (68.6 kg)   Height: 5\' 8"  (1.727 m)     GENERAL: The patient is a well-nourished male, in no acute distress. The vital signs are documented above. CARDIOVASCULAR: Did not appreciate right or left carotid bruit.  Easily palpable radial pulses bilaterally PULMONARY: There is good air exchange  ABDOMEN: Soft and non-tender  MUSCULOSKELETAL: There are no major deformities or cyanosis. NEUROLOGIC: No focal weakness or paresthesias are detected. SKIN: There are no ulcers or rashes noted. PSYCHIATRIC: The patient has a normal affect.  DATA:  [facility] reviewed [facility].  We repeated his study today.  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  Explained it is impossible to know how long this is been present.  It does not sound as though he was having ischemic problems to his left eye.  Even if this was related, there would be no treatment.  I did explain the importance of keeping close surveillance of his right internal carotid artery stenosis.  Reviewed symptoms and he knows to report immediately to the [facility] emergency room should he have any difficulty with neurologic deficits.  Otherwise we will see him again in our office in 6 months with carotid duplex follow-up   Larina Earthly, MD [facility] Vascular and Vein Specialists of [facility] Tel [phone] Pager [phone]

## 2018-10-11 ENCOUNTER — Other Ambulatory Visit: Payer: Self-pay | Admitting: Medical

## 2018-10-11 ENCOUNTER — Telehealth: Payer: Self-pay | Admitting: Medical

## 2018-10-11 NOTE — Telephone Encounter (Signed)
Have him check BP tonight and tomorrow morning and call in readings

## 2018-10-11 NOTE — Telephone Encounter (Signed)
Pt called and states he bought a new BP machine and took his readings for the first time today. One time bp was 89/60 and then took 10 mins later and was 90/59. He is concerned.  Also pt needs refills of Cholesterol medication. I do not see that he is own that kind of med.  Please advise pt at [phone]

## 2018-10-11 NOTE — Telephone Encounter (Signed)
Is this ok to refill?  

## 2018-10-12 ENCOUNTER — Telehealth: Payer: Self-pay

## 2018-10-12 ENCOUNTER — Other Ambulatory Visit: Payer: Self-pay

## 2018-10-12 ENCOUNTER — Other Ambulatory Visit: Payer: 59

## 2018-10-12 NOTE — Telephone Encounter (Signed)
He came in this morning for BP check and it was low borderline.  I will have him change to lower dose of Lisinopril 10mg , 1/2 tablet daily for now, continue home BP monitoring.

## 2018-10-12 NOTE — Telephone Encounter (Signed)
Patient states that 108/67 with the new monitor he got yesterday.  He is going to come by the office this am to have me check his BP.

## 2018-10-12 NOTE — Telephone Encounter (Signed)
Patient notified of recommendations this morning.  I told him to take his BP this morning and call me back with reading.

## 2018-10-15 ENCOUNTER — Other Ambulatory Visit: Payer: 59

## 2018-10-17 ENCOUNTER — Other Ambulatory Visit: Payer: Self-pay | Admitting: Medical

## 2018-10-17 ENCOUNTER — Telehealth: Payer: Self-pay | Admitting: Medical

## 2018-10-17 NOTE — Telephone Encounter (Signed)
Pt states he is not taking lisinopril. He is still feeling some weakness. He also told me to tell you to look out for a form from Dr. Donnetta Hutching from vein and vascular

## 2018-10-17 NOTE — Telephone Encounter (Signed)
Is this okay to refill? 

## 2018-10-17 NOTE — Telephone Encounter (Signed)
Pt called and states his bp has been around 108/65. Pt can be reached at [phone].

## 2018-10-17 NOTE — Telephone Encounter (Signed)
Last week we had him cut his lisinopril in half daily.   I assume he is still doing this correct?   Is he feeling fine ,or is he feeling lightheaded or weak?

## 2018-10-19 ENCOUNTER — Other Ambulatory Visit: Payer: Self-pay | Admitting: Medical

## 2018-10-19 ENCOUNTER — Telehealth: Payer: Self-pay | Admitting: Medical

## 2018-10-19 NOTE — Telephone Encounter (Signed)
I called and talked to patient.  He is almost quit tobacco.    He continues on Crestor.   He has stopped Lisinopril given low BPs under 100/65 regularly.    Plan fasting lipid test in 6 weeks.   otherwrise feeling ok for now.

## 2018-10-19 NOTE — Telephone Encounter (Signed)
Please try to get a copy of the eye doctor report from both other doctors he is seen recently.  The last office note from each eye doctor.  He is frustrated as one eye doctor is telling him he has a cataract but he says the other eye doctor says he does not have a cataract.  He feels like he is getting 2 different reports that are confusing or completing.  I told him I would look over the reports  I also want to see him back in 6 weeks fasting regarding cholesterol to make sure Crestor is working effective enough  He also quit lisinopril as his blood pressure is running too low

## 2018-10-25 NOTE — Telephone Encounter (Signed)
I tried to call and get records from [facility] and they need a records release.  Dr Radene Ou office is faxing records to me.

## 2018-10-29 NOTE — Telephone Encounter (Signed)
Ok.  Put this in your folder to watch for.  I haven't seen anything in my folder.

## 2018-10-31 NOTE — Telephone Encounter (Signed)
I reviewed the records from Dr. Zadie Rhine.  Per his notes, he needs a few follow appointments with him to ensure his treatment is working.  He also needs follow up with Dr. Eulas Post for a different type of treatment given his condition.  This is related to nerves and blood flow, so it does appear he needs to see 2 different types of eye doctors.

## 2018-11-01 NOTE — Telephone Encounter (Signed)
Patient notified of your recommendations.

## 2019-03-13 ENCOUNTER — Telehealth: Payer: Self-pay

## 2019-03-13 ENCOUNTER — Encounter: Payer: Self-pay | Admitting: Medical

## 2019-03-13 ENCOUNTER — Other Ambulatory Visit: Payer: Self-pay

## 2019-03-13 ENCOUNTER — Ambulatory Visit: Payer: 59 | Admitting: Medical

## 2019-03-13 VITALS — BP 120/70 | HR 65 | Ht 68.0 in | Wt 158.6 lb

## 2019-03-13 DIAGNOSIS — I351 Nonrheumatic aortic (valve) insufficiency: Secondary | ICD-10-CM

## 2019-03-13 DIAGNOSIS — H348122 Central retinal vein occlusion, left eye, stable: Secondary | ICD-10-CM

## 2019-03-13 DIAGNOSIS — Z0000 Encounter for general adult medical examination without abnormal findings: Secondary | ICD-10-CM

## 2019-03-13 DIAGNOSIS — I10 Essential (primary) hypertension: Secondary | ICD-10-CM

## 2019-03-13 DIAGNOSIS — Z7189 Other specified counseling: Secondary | ICD-10-CM | POA: Diagnosis not present

## 2019-03-13 DIAGNOSIS — I6523 Occlusion and stenosis of bilateral carotid arteries: Secondary | ICD-10-CM

## 2019-03-13 DIAGNOSIS — R7301 Impaired fasting glucose: Secondary | ICD-10-CM

## 2019-03-13 DIAGNOSIS — I739 Peripheral vascular disease, unspecified: Secondary | ICD-10-CM

## 2019-03-13 DIAGNOSIS — N400 Enlarged prostate without lower urinary tract symptoms: Secondary | ICD-10-CM

## 2019-03-13 DIAGNOSIS — R748 Abnormal levels of other serum enzymes: Secondary | ICD-10-CM

## 2019-03-13 DIAGNOSIS — E785 Hyperlipidemia, unspecified: Secondary | ICD-10-CM | POA: Diagnosis not present

## 2019-03-13 DIAGNOSIS — Z1211 Encounter for screening for malignant neoplasm of colon: Secondary | ICD-10-CM

## 2019-03-13 DIAGNOSIS — Z8042 Family history of malignant neoplasm of prostate: Secondary | ICD-10-CM | POA: Insufficient documentation

## 2019-03-13 DIAGNOSIS — R0989 Other specified symptoms and signs involving the circulatory and respiratory systems: Secondary | ICD-10-CM

## 2019-03-13 DIAGNOSIS — Z2820 Immunization not carried out because of patient decision for unspecified reason: Secondary | ICD-10-CM | POA: Insufficient documentation

## 2019-03-13 DIAGNOSIS — Z7185 Encounter for immunization safety counseling: Secondary | ICD-10-CM

## 2019-03-13 DIAGNOSIS — Z720 Tobacco use: Secondary | ICD-10-CM | POA: Diagnosis not present

## 2019-03-13 DIAGNOSIS — R7989 Other specified abnormal findings of blood chemistry: Secondary | ICD-10-CM

## 2019-03-13 MED ORDER — LISINOPRIL 10 MG PO TABS: 10.0000 mg | ORAL_TABLET | Freq: Every day | ORAL | 3 refills | Status: DC

## 2019-03-13 NOTE — Progress Notes (Signed)
Subjective:   HPI  Terry Gross is a 53 y.o. male who presents for Chief Complaint  Patient presents with  . Annual Exam    with fasting labs     Patient Care Team: , Kermit Baloavid S, PA-C as PCP - General (Family Medicine) Dr. Jethro BolusMark Shapiro, ophthalmology Dr. Fawn KirkGary Rankin, retina specialist Dr. Gretta Beganodd Early, vascular surgery Dr. Reather LittlerAjay Kumar, endocrinology Prior cardiology with Dr. Viann FishSpencer Tilley  Concerns: Frustrated that he hasn't had carotid surgery due to blockages  frustrated with run around from eye doctors, sees 2 different eye doctors and now getting clear answers about his conditions  Reviewed their medical, surgical, family, social, medication, and allergy history and updated chart as appropriate.  Past Medical History:  Diagnosis Date  . Aortic valve insufficiency 12/15/2014   Moderate by ECHO 12/15/14 normal LV function   . Back pain   . Carotid artery occlusion   . Cataract   . Hyperlipidemia   . Hypertension   . Impaired fasting blood sugar   . Loss of balance   . Peripheral vascular disease (HCC)   . Smoker     Past Surgical History:  Procedure Laterality Date  . CATARACT EXTRACTION      Social History   Socioeconomic History  . Marital status: Married    Spouse name: Not on file  . Number of children: Not on file  . Years of education: Not on file  . Highest education level: Not on file  Occupational History  . Not on file  Social Needs  . Financial resource strain: Not on file  . Food insecurity    Worry: Not on file    Inability: Not on file  . Transportation needs    Medical: Not on file    Non-medical: Not on file  Tobacco Use  . Smoking status: Current Every Day Smoker    Packs/day: 0.25    Years: 32.00    Pack years: 8.00    Types: Cigarettes  . Smokeless tobacco: Never Used  Substance and Sexual Activity  . Alcohol use: No  . Drug use: No  . Sexual activity: Not on file  Lifestyle  . Physical activity    Days per week:  Not on file    Minutes per session: Not on file  . Stress: Not on file  Relationships  . Social Musicianconnections    Talks on phone: Not on file    Gets together: Not on file    Attends religious service: Not on file    Active member of club or organization: Not on file    Attends meetings of clubs or organizations: Not on file    Relationship status: Not on file  . Intimate partner violence    Fear of current or ex partner: Not on file    Emotionally abused: Not on file    Physically abused: Not on file    Forced sexual activity: Not on file  Other Topics Concern  . Not on file  Social History Narrative   Married, no children.   Unemployed as of 09/2017.   Exercise some with calisthenics, walking.    From [place] originally.   03/2019    Family History  Problem Relation Age of Onset  . CVA Mother   . Other Mother        brain tumor  . Other Father        complications from prostate sugery  . Cancer Father  prostate  . Thyroid disease Neg Hx   . Heart disease Neg Hx   . Diabetes Neg Hx   . Kidney disease Neg Hx   . Hypertension Neg Hx   . Hyperlipidemia Neg Hx      Current Outpatient Medications:  .  aspirin (ASPIRIN ADULT LOW STRENGTH) 81 MG EC tablet, TAKE 1 TABLET (81 MG TOTAL) BY MOUTH DAILY., Disp: 90 tablet, Rfl: 3 .  rosuvastatin (CRESTOR) 40 MG tablet, TAKE 1 TABLET BY MOUTH EVERY DAY, Disp: 90 tablet, Rfl: 3 .  tamsulosin (FLOMAX) 0.4 MG CAPS capsule, TAKE 1 CAPSULE BY MOUTH EVERY DAY, Disp: 90 capsule, Rfl: 3 .  lisinopril (ZESTRIL) 10 MG tablet, Take 1 tablet (10 mg total) by mouth daily., Disp: 90 tablet, Rfl: 3  Not on File     Review of Systems Constitutional: -fever, -chills, -sweats, -unexpected weight change, -decreased appetite, -fatigue Allergy: -sneezing, -itching, -congestion Dermatology: -changing moles, --rash, -lumps ENT: -runny nose, -ear pain, -sore throat, -hoarseness, -sinus pain, -teeth pain, - ringing in ears, -hearing loss,  -nosebleeds Cardiology: -chest pain, -palpitations, -swelling, -difficulty breathing when lying flat, -waking up short of breath Respiratory: -cough, -shortness of breath, -difficulty breathing with exercise or exertion, -wheezing, -coughing up blood Gastroenterology: -abdominal pain, -nausea, -vomiting, -diarrhea, -constipation, -blood in stool, -changes in bowel movement, -difficulty swallowing or eating Hematology: -bleeding, -bruising  Musculoskeletal: -joint aches, -muscle aches, -joint swelling, -back pain, -neck pain, -cramping, -changes in gait Ophthalmology: +vision changes, eye redness, itching, discharge Urology: -burning with urination, -difficulty urinating, -blood in urine, -urinary frequency, -urgency, -incontinence Neurology: -headache, -weakness, -tingling, -numbness, -memory loss, -falls, -dizziness Psychology: -depressed mood, -agitation, -sleep problems Male GU: no testicular mass, pain, no lymph nodes swollen, no swelling, no rash.     Objective:  BP 120/70   Pulse 65   Ht 5\' 8"  (1.727 m)   Wt 158 lb 9.6 oz (71.9 kg)   SpO2 97%   BMI 24.12 kg/m   General appearance: alert, no distress, WD/WN, Mediterranean male Skin: scattered macules, no worrisome lesions HEENT: normocephalic, conjunctiva/corneas normal, sclerae anicteric, PERRLA, EOMi, nares patent, no discharge or erythema, pharynx normal Oral cavity: MMM, tongue normal, teeth normal Neck: supple, no lymphadenopathy, no thyromegaly, no masses, normal ROM, no bruits Chest: non tender, normal shape and expansion Heart: RRR, normal S1, S2, no murmurs Lungs: CTA bilaterally, no wheezes, rhonchi, or rales Abdomen: +bs, soft, non tender, non distended, no masses, no hepatomegaly, no splenomegaly, no bruits Back: non tender, normal ROM, no scoliosis Musculoskeletal: upper extremities non tender, no obvious deformity, normal ROM throughout, lower extremities non tender, no obvious deformity, normal ROM  throughout Extremities: no edema, no cyanosis, no clubbing Pulses: 2+ symmetric, upper and lower extremities, normal cap refill Neurological: alert, oriented x 3, CN2-12 intact, strength normal upper extremities and lower extremities, sensation normal throughout, DTRs 2+ throughout, no cerebellar signs, gait normal Psychiatric: normal affect, behavior normal, pleasant  GU: declined Rectal: deferred   Assessment and Plan :   Encounter Diagnoses  Name Primary?  . Encounter for health maintenance examination in adult Yes  . Vaccine counseling   . Tobacco abuse   . Hyperlipidemia, unspecified hyperlipidemia type   . Elevated liver enzymes   . Bilateral carotid bruits   . Abnormal thyroid blood test   . Impaired fasting blood sugar   . Retinal vein occlusion of left eye, unspecified retinal vein   . PVD (peripheral vascular disease) ([place])   . Essential hypertension   . 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 Aortic valve insufficiency, etiology of cardiac valve disease unspecified   . Vaccine refused by patient   . Family history of prostate cancer in father   . Screen for colon cancer   . Benign prostatic hyperplasia, unspecified whether lower urinary tract symptoms present     Physical exam - discussed and counseled on healthy lifestyle, diet, exercise, preventative care, vaccinations, sick and well care, proper use of emergency dept and after hours care, and addressed their concerns.     Health screening: See your dentist yearly for routine dental care including hygiene visits twice yearly.  See me yearly for physical    Cancer screens: I recommend we update your colon cancer screen with either colonoscopy or Cologard stool test today. You declined both today  I recommend a chest CT scan to screen for lung cancer.   Let me know if you want to schedule this.  We will check a PSA prostate cancer lab screen today   Vaccines: I recommend the following  vaccines:  I recommend a yearly flu vaccine  I recommend the 2 shot Shingrix vaccine given 2 months a part to prevent varicella zoster outbreak  I recommend we update your Tetanus booster done every 10 years  You declined your vaccines today    Problems  Hyperlipidemia/High Cholesterol and Peripheral Vascular disease:  Continue Crestor 40mg  daily at bedtime  Continue Aspirin 81mg  daily  Follow up with Dr. , vascular surgery to discuss surgery to stent your carotid artery  QUIT SMOKING!!!  Hypertension/High blood pressure  Continue Lisinopril 10mg  daily  BPH/prostate enlargement  Continue Flomax for prostate enlargement   Abnormal thyroid tests  We are rechecking these labs today  Impaired fasting glucose/prediabetes  We are rechecking these labs today  Aortic valve disease  I will refer you back to cardiology as its time for follow up  Elevated liver tests  We will recheck labs today  If still elevated, you may need other tests for this    Jabari was seen today for annual exam.  Diagnoses and all orders for this visit:  Encounter for health maintenance examination in adult -     Comprehensive metabolic panel -     CBC with Differential/Platelet -     Hemoglobin A1c -     TSH -     T4, Free -     Lipid panel -     PSA  Vaccine counseling  Tobacco abuse  Hyperlipidemia, unspecified hyperlipidemia type -     Lipid panel  Elevated liver enzymes  Bilateral carotid bruits  Abnormal thyroid blood test -     TSH -     T4, Free  Impaired fasting blood sugar -     Hemoglobin A1c  Retinal vein occlusion of left eye, unspecified retinal vein  PVD (peripheral vascular disease) (HCC)  Essential hypertension  Obstruction of 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   Follow-up pending labs, yearly for physical

## 2019-03-13 NOTE — Telephone Encounter (Signed)
Pt. Called back this afternoon he was here this morning but forgot to ask you if he needs to go back on his BP medication which is Lisinopril, he has been off of it for about 3-4 months. Please advise.

## 2019-03-14 LAB — CBC WITH DIFFERENTIAL/PLATELET
Basophils Absolute: 0 10*3/uL (ref 0.0–0.2)
Basos: 0 %
EOS (ABSOLUTE): 0.2 10*3/uL (ref 0.0–0.4)
Eos: 2 %
Hematocrit: 45.6 % (ref 37.5–51.0)
Hemoglobin: 15.6 g/dL (ref 13.0–17.7)
Immature Grans (Abs): 0 10*3/uL (ref 0.0–0.1)
Immature Granulocytes: 0 %
Lymphocytes Absolute: 3.2 10*3/uL — ABNORMAL HIGH (ref 0.7–3.1)
Lymphs: 39 %
MCH: 32.2 pg (ref 26.6–33.0)
MCHC: 34.2 g/dL (ref 31.5–35.7)
MCV: 94 fL (ref 79–97)
Monocytes Absolute: 0.5 10*3/uL (ref 0.1–0.9)
Monocytes: 6 %
Neutrophils Absolute: 4.4 10*3/uL (ref 1.4–7.0)
Neutrophils: 53 %
Platelets: 220 10*3/uL (ref 150–450)
RBC: 4.84 x10E6/uL (ref 4.14–5.80)
RDW: 12.1 % (ref 11.6–15.4)
WBC: 8.3 10*3/uL (ref 3.4–10.8)

## 2019-03-14 LAB — COMPREHENSIVE METABOLIC PANEL
ALT: 19 IU/L (ref 0–44)
AST: 24 IU/L (ref 0–40)
Albumin/Globulin Ratio: 1.8 (ref 1.2–2.2)
Albumin: 4.8 g/dL (ref 3.8–4.9)
Alkaline Phosphatase: 65 IU/L (ref 39–117)
BUN/Creatinine Ratio: 13 (ref 9–20)
BUN: 11 mg/dL (ref 6–24)
Bilirubin Total: 0.4 mg/dL (ref 0.0–1.2)
CO2: 26 mmol/L (ref 20–29)
Calcium: 9.5 mg/dL (ref 8.7–10.2)
Chloride: 105 mmol/L (ref 96–106)
Creatinine, Ser: 0.84 mg/dL (ref 0.76–1.27)
GFR calc Af Amer: 114 mL/min/{1.73_m2} (ref >59)
GFR calc non Af Amer: 99 mL/min/{1.73_m2} (ref >59)
Globulin, Total: 2.7 g/dL (ref 1.5–4.5)
Glucose: 92 mg/dL (ref 65–99)
Potassium: 4.8 mmol/L (ref 3.5–5.2)
Sodium: 143 mmol/L (ref 134–144)
Total Protein: 7.5 g/dL (ref 6.0–8.5)

## 2019-03-14 LAB — HEMOGLOBIN A1C
Est. average glucose Bld gHb Est-mCnc: 120 mg/dL
Hgb A1c MFr Bld: 5.8 % — ABNORMAL HIGH (ref 4.8–5.6)

## 2019-03-14 LAB — LIPID PANEL
Chol/HDL Ratio: 2.4 ratio (ref 0.0–5.0)
Cholesterol, Total: 111 mg/dL (ref 100–199)
HDL: 46 mg/dL (ref >39)
LDL Chol Calc (NIH): 53 mg/dL (ref 0–99)
Triglycerides: 54 mg/dL (ref 0–149)
VLDL Cholesterol Cal: 12 mg/dL (ref 5–40)

## 2019-03-14 LAB — PSA: Prostate Specific Ag, Serum: 0.4 ng/mL (ref 0.0–4.0)

## 2019-03-14 LAB — T4, FREE: Free T4: 1.5 ng/dL (ref 0.82–1.77)

## 2019-03-14 LAB — TSH: TSH: 0.392 u[IU]/mL — ABNORMAL LOW (ref 0.450–4.500)

## 2019-03-18 ENCOUNTER — Telehealth: Payer: Self-pay

## 2019-03-18 ENCOUNTER — Encounter: Payer: Self-pay | Admitting: Medical

## 2019-03-18 ENCOUNTER — Telehealth: Payer: Self-pay | Admitting: Family Medicine

## 2019-03-18 NOTE — Telephone Encounter (Signed)
Patient called and wants to know if you have talked to Dr. Donnetta Hutching. Please advise as he stated he is waiting ti hear back from you.

## 2019-03-18 NOTE — Telephone Encounter (Signed)
Returned call to [place] he was concerned that in all these years he has never seen a doctor.  He states his wife is a Marine scientist and she states it is not right for him not to see the doctor here.  I offered him an appointment and he said next time.  Terry Gross is upset that we did not make him aware of his Carotid arteries being blocked before they became blocked.  I tried to explain his cardiologist would order those test and he said no, it would be Dr. Audelia Acton.    Pt is waiting to hear back from you regarding labs results and Dr. Donnetta Hutching info.  Please call patient [phone]

## 2019-03-19 ENCOUNTER — Other Ambulatory Visit: Payer: Self-pay | Admitting: Medical

## 2019-03-19 NOTE — Telephone Encounter (Signed)
I called his phone this morning to discuss his results sent to talk with him about his concerns.  If he calls back please get me on the phone so we can discuss.

## 2019-04-05 ENCOUNTER — Other Ambulatory Visit: Payer: Self-pay

## 2019-04-05 DIAGNOSIS — I6523 Occlusion and stenosis of bilateral carotid arteries: Secondary | ICD-10-CM

## 2019-04-09 ENCOUNTER — Ambulatory Visit (HOSPITAL_COMMUNITY)
Admission: RE | Admit: 2019-04-09 | Discharge: 2019-04-09 | Disposition: A | Discharge status: Home / Self Care | Payer: 59 | Source: Ambulatory Visit | Attending: Family | Admitting: Family

## 2019-04-09 ENCOUNTER — Encounter: Payer: Self-pay | Admitting: Vascular Surgery

## 2019-04-09 ENCOUNTER — Other Ambulatory Visit: Payer: Self-pay

## 2019-04-09 ENCOUNTER — Ambulatory Visit (INDEPENDENT_AMBULATORY_CARE_PROVIDER_SITE_OTHER): Payer: 59 | Admitting: Vascular Surgery

## 2019-04-09 VITALS — BP 136/70 | HR 67 | Temp 97.5°F | Resp 20 | Ht 68.0 in | Wt 155.8 lb

## 2019-04-09 DIAGNOSIS — I6523 Occlusion and stenosis of bilateral carotid arteries: Secondary | ICD-10-CM | POA: Diagnosis not present

## 2019-04-09 NOTE — Progress Notes (Signed)
Vascular and Vein Specialist of [facility]  Patient name: Terry Gross MRN: [id number] DOB: Sep 02, 1965 Sex: male  REASON FOR VISIT: Follow-up carotid disease  HPI: Terry Gross is a 53 y.o. male here today for follow-up.  Had initially seen him 6 months ago.  Duplex at that time had shown a left internal carotid artery occlusion and a moderate 60 to 79% right carotid stenosis as well.  He is here today for 30-month follow-up.  He reports that he has had no focal neurologic deficit.  He has had difficulty with what sounds like a bleed into his left eye.  He reports that he has had 4 surgeries now including cataract extraction and continues to have difficulty with his vision.  Past Medical History:  Diagnosis Date  . Aortic valve insufficiency 12/15/2014   Moderate by ECHO 12/15/14 normal LV function   . Back pain   . Carotid artery occlusion   . Cataract   . Hyperlipidemia   . Hypertension   . Impaired fasting blood sugar   . Loss of balance   . Peripheral vascular disease (HCC)   . Smoker     Family History  Problem Relation Age of Onset  . CVA Mother   . Other Mother        brain tumor  . Other Father        complications from prostate sugery  . Cancer Father        prostate  . Thyroid disease Neg Hx   . Heart disease Neg Hx   . Diabetes Neg Hx   . Kidney disease Neg Hx   . Hypertension Neg Hx   . Hyperlipidemia Neg Hx     SOCIAL HISTORY: Social History   Tobacco Use  . Smoking status: Current Every Day Smoker    Packs/day: 0.25    Years: 32.00    Pack years: 8.00    Types: Cigarettes  . Smokeless tobacco: Never Used  Substance Use Topics  . Alcohol use: No    No Known Allergies  Current Outpatient Medications  Medication Sig Dispense Refill  . aspirin (ASPIRIN ADULT LOW STRENGTH) 81 MG EC tablet TAKE 1 TABLET (81 MG TOTAL) BY MOUTH DAILY. 90 tablet 3  . rosuvastatin (CRESTOR) 40 MG tablet TAKE 1 TABLET BY  MOUTH EVERY DAY 90 tablet 3  . tamsulosin (FLOMAX) 0.4 MG CAPS capsule TAKE 1 CAPSULE BY MOUTH EVERY DAY 90 capsule 3   No current facility-administered medications for this visit.     REVIEW OF SYSTEMS:  [X]  denotes positive finding, [ ]  denotes negative finding Cardiac  Comments:  Chest pain or chest pressure:    Shortness of breath upon exertion:    Short of breath when lying flat:    Irregular heart rhythm:        Vascular    Pain in calf, thigh, or hip brought on by ambulation:    Pain in feet at night that wakes you up from your sleep:     Blood clot in your veins:    Leg swelling:           PHYSICAL EXAM: Vitals:   04/09/19 0951 04/09/19 0954  BP: 123/68 136/70  Pulse: 67   Resp: 20   Temp: (!) 97.5 F (36.4 C)   SpO2: 100%   Weight: 155 lb 12.8 oz (70.7 kg)   Height: 5\' 8"  (1.727 m)     GENERAL: The patient is a well-nourished male, in no acute  distress. The vital signs are documented above. CARDIOVASCULAR: I do not hear carotid bruits bilaterally.  He has 2+ radial pulses bilaterally. PULMONARY: There is good air exchange  MUSCULOSKELETAL: There are no major deformities or cyanosis. NEUROLOGIC: No focal weakness or paresthesias are detected. SKIN: There are no ulcers or rashes noted. PSYCHIATRIC: The patient has a normal affect.  DATA:  Carotid duplex today shows known left internal carotid artery stenosis.  His degree of right carotid stenosis is somewhat less than that predicted in his study 6 months ago.  He is now in the 40 to 59% range of right carotid stenosis  MEDICAL ISSUES: I had a long discussion with the patient regarding this.  I explained that with his left carotid occlusion that there is no treatment option.  Also explained that it would be very difficult to relate any left eye difficulty to his left internal carotid artery occlusion.  I have recommended that we see him again in 1 year with repeat carotid duplex and he knows to present should he  have any neurologic deficits    Rosetta Posner, MD [facility] Vascular and Vein Specialists of [facility] Tel [phone] Pager [phone]

## 2019-04-30 ENCOUNTER — Other Ambulatory Visit: Payer: Self-pay | Admitting: *Deleted

## 2019-04-30 DIAGNOSIS — I6523 Occlusion and stenosis of bilateral carotid arteries: Secondary | ICD-10-CM

## 2019-05-15 ENCOUNTER — Encounter: Payer: Self-pay | Admitting: Cardiology

## 2019-05-15 ENCOUNTER — Ambulatory Visit (INDEPENDENT_AMBULATORY_CARE_PROVIDER_SITE_OTHER): Payer: 59 | Admitting: Cardiology

## 2019-05-15 ENCOUNTER — Other Ambulatory Visit: Payer: Self-pay

## 2019-05-15 VITALS — BP 131/70 | HR 80 | Temp 98.7°F | Ht 68.0 in | Wt 159.0 lb

## 2019-05-15 DIAGNOSIS — I351 Nonrheumatic aortic (valve) insufficiency: Secondary | ICD-10-CM

## 2019-05-15 DIAGNOSIS — F17210 Nicotine dependence, cigarettes, uncomplicated: Secondary | ICD-10-CM | POA: Diagnosis not present

## 2019-05-15 DIAGNOSIS — E782 Mixed hyperlipidemia: Secondary | ICD-10-CM

## 2019-05-15 DIAGNOSIS — F17200 Nicotine dependence, unspecified, uncomplicated: Secondary | ICD-10-CM | POA: Insufficient documentation

## 2019-05-15 DIAGNOSIS — I6521 Occlusion and stenosis of right carotid artery: Secondary | ICD-10-CM | POA: Insufficient documentation

## 2019-05-15 DIAGNOSIS — I350 Nonrheumatic aortic (valve) stenosis: Secondary | ICD-10-CM | POA: Diagnosis not present

## 2019-05-15 DIAGNOSIS — I6522 Occlusion and stenosis of left carotid artery: Secondary | ICD-10-CM | POA: Insufficient documentation

## 2019-05-15 MED ORDER — NICOTINE POLACRILEX 2 MG MT GUM: 2.0000 mg | CHEWING_GUM | OROMUCOSAL | 1 refills | Status: DC | PRN

## 2019-05-15 NOTE — Addendum Note (Signed)
Addended by: Elder Negus on: 05/15/2019 10:00 AM   Modules accepted: Level of Service

## 2019-05-15 NOTE — Progress Notes (Signed)
Patient referred by Carlena Hurl, PA-C for aortic insufficiency  Subjective:   Terry Gross, male    DOB: 21-Dec-1965, 54 y.o.   MRN: [id number]   Chief Complaint  Patient presents with  . New Patient (Initial Visit)    Aortic Valve Insuff     HPI  54 year old male with hypertension, hyperlipidemia, peripheral artery disease, tobacco dependence, referred for evaluation of aortic valve insufficiency.  Mild aortic insufficiency, moderate aortic stenosis was noted on outside echocardiogram in May 2020.  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 His major issue over the last few years has been left eye. He was told to have problems with his retina, as well as cataract. He has undergone several surgeries, and has had minimal improvement in his vision.   Patient is originally from [place], has been living in [place] since 1995 with his wife, works at [employer]. 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 He denies chest pain, shortness of breath, palpitations, leg edema, orthopnea, PND, TIA/syncope.  His lipid panel has improved remarkably on atorvastatin.   Past Medical History:  Diagnosis Date  . Aortic valve insufficiency 12/15/2014   Moderate by ECHO 12/15/14 normal LV function   . Back pain   . Carotid artery occlusion   . Cataract   . Hyperlipidemia   . Hypertension   . Impaired fasting blood sugar   . Loss of balance   . Peripheral vascular disease ([place])   . Smoker      Past Surgical History:  Procedure Laterality Date  . CATARACT EXTRACTION       Social History   Tobacco Use  Smoking Status Current Every Day Smoker  . Packs/day: 0.25  . Years: 32.00  . Pack years: 8.00  . Types: Cigarettes  Smokeless Tobacco Never Used    Social History   Substance and Sexual Activity  Alcohol Use No     Family History  Problem Relation Age  of Onset  . CVA Mother   . Other Mother        brain tumor  . Other Father        complications from prostate sugery  . Cancer Father        prostate  . Thyroid disease Neg Hx   . Heart disease Neg Hx   . Diabetes Neg Hx   . Kidney disease Neg Hx   . Hypertension Neg Hx   . Hyperlipidemia Neg Hx      Current Outpatient Medications on File Prior to Visit  Medication Sig Dispense Refill  . aspirin (ASPIRIN ADULT LOW STRENGTH) 81 MG EC tablet TAKE 1 TABLET (81 MG TOTAL) BY MOUTH DAILY. 90 tablet 3  . Omega-3 Fatty Acids (FISH OIL) 1200 MG CAPS Take by mouth daily.    . rosuvastatin (CRESTOR) 40 MG tablet TAKE 1 TABLET BY MOUTH EVERY DAY 90 tablet 3  . tamsulosin (FLOMAX) 0.4 MG CAPS capsule TAKE 1 CAPSULE BY MOUTH EVERY DAY 90 capsule 3   No current facility-administered medications on file prior to visit.    Cardiovascular and other pertinent studies:  EKG 05/15/2019: Sinus rhythm 78 bpm. Normal EKG.    Vascular US 04/09/2019:  Right Carotid: Velocities in the right ICA are consistent with a 40-59% stenosis. Left Carotid: Evidence consistent with a total occlusion of the left ICA. Vertebrals:  Bilateral vertebral arteries demonstrate antegrade flow.  Subclavians:  Right subclavian artery flow was turbulent. Normal flow hemodynamics were seen in the left subclavian artery.  Echocardiogram 09/24/2018: 1. The left ventricle has normal systolic function with an ejection fraction of 60-65%. The cavity size was normal. Left ventricular diastolic Doppler parameters are consistent with impaired relaxation.  2. The right ventricle has normal systolic function. The cavity was normal.   3. The mitral valve is grossly normal.  4. The tricuspid valve is grossly normal.  5. The aortic valve is tricuspid. Moderate calcification of the aortic valve. Aortic valve regurgitation is mild by color flow Doppler. Moderate stenosis of the aortic valve.  6. Normal LV systolic function; mild diastolic  dysfunction; calcified aortic valve with moderate AS (mean gradient 20 mmHg) and mild AI.   I reviewed outside tests, documents, independently interpreted the tests (without reporting) and discussed management and test interpretation with the patient.  Recent labs: 03/13/2019: Glucose 92, BUN/Cr 11/0.84. EGFR 99. Na/K 143/4.8. Rest of the CMP normal H/H 15/45. MCV 94. Platelets 220 HbA1C 5.8% Chol 111, TG 54, HDL 46, LDL 53 TSH 0.39 (low) Free T4 1.5 normal   Review of Systems  Eyes: Positive for vision loss in left eye.  Cardiovascular: Negative for chest pain, dyspnea on exertion, leg swelling, palpitations and syncope.          Vitals:   05/15/19 0857  BP: 131/70  Pulse: 80  Temp: 98.7 F (37.1 C)  SpO2: 98%     Body mass index is 24.18 kg/m. There were no vitals filed for this visit.   Objective:   Physical Exam  Constitutional: He appears well-developed and well-nourished.  Neck: No JVD present.  Cardiovascular: Normal rate, regular rhythm and intact distal pulses.  Murmur heard.  Harsh midsystolic murmur is present with a grade of 2/6 at the upper right sternal border radiating to the neck. Left bruit likely represents conducted aortic murmur Pulses:      Carotid pulses are on the right side with bruit and on the left side with bruit. Pulmonary/Chest: Effort normal and breath sounds normal. He has no wheezes. He has no rales.  Musculoskeletal:        General: No edema.  Nursing note and vitals reviewed.       Assessment & Recommendations:   54 year old male with hypertension, hyperlipidemia, peripheral artery disease, tobacco dependence, moderate aortic stenosis, mild aortic valve insufficiency.  Moderate AS/Mild AI: I reviewed outside tests, documents, independently interpreted the tests (without reporting) and discussed management and test interpretation with the patient. Clinically asymptomatic. Repeat echocardiogram in 6  months  Hyperlipidemia: Remarkable improvement on rosuvastatin 40 mg daily. Continue the same.   PAD: Lt IC occlusion, Rt IC moderate stenosis. Continue Aspirin, statin. Discussed smoking cessation.  Tobacco cessation counseling:  - Currently smoking 1/2 packs/day   - Patient was informed of the dangers of tobacco abuse including stroke, cancer, and MI, as well as benefits of tobacco cessation. - Patient is willing to quit at this time. - Approximately 5 mins were spent counseling patient cessation techniques. We discussed various methods to help quit smoking, including deciding on a date to quit, joining a support group, pharmacological agents. Patient would like to use nicotine gum. - I will reassess his progress at the next follow-up visit   Thank you for referring the patient to [place]. Please feel free to contact with any questions.  Nigel Mormon, MD [facility] Pager: [phone] Office: [phone]

## 2019-06-06 ENCOUNTER — Telehealth: Payer: Self-pay | Admitting: Medical

## 2019-06-06 NOTE — Telephone Encounter (Signed)
Spoke to patient and although I have scheduled him, he still wants a call from you

## 2019-06-06 NOTE — Telephone Encounter (Signed)
Pt  Called concerning his eye. He is requesting a call back. Pt states that Vincenza Hews knew what was going on. Please call pt at [phone].

## 2019-06-10 ENCOUNTER — Encounter: Payer: Self-pay | Admitting: Medical

## 2019-06-10 ENCOUNTER — Other Ambulatory Visit: Payer: Self-pay

## 2019-06-10 ENCOUNTER — Ambulatory Visit: Payer: 59 | Admitting: Medical

## 2019-06-10 VITALS — BP 140/70 | HR 63 | Temp 97.8°F | Ht 68.0 in | Wt 154.2 lb

## 2019-06-10 DIAGNOSIS — H269 Unspecified cataract: Secondary | ICD-10-CM

## 2019-06-10 DIAGNOSIS — H4312 Vitreous hemorrhage, left eye: Secondary | ICD-10-CM

## 2019-06-10 DIAGNOSIS — I10 Essential (primary) hypertension: Secondary | ICD-10-CM | POA: Diagnosis not present

## 2019-06-10 DIAGNOSIS — I739 Peripheral vascular disease, unspecified: Secondary | ICD-10-CM

## 2019-06-10 DIAGNOSIS — H2511 Age-related nuclear cataract, right eye: Secondary | ICD-10-CM | POA: Insufficient documentation

## 2019-06-10 DIAGNOSIS — I351 Nonrheumatic aortic (valve) insufficiency: Secondary | ICD-10-CM | POA: Diagnosis not present

## 2019-06-10 DIAGNOSIS — R7301 Impaired fasting glucose: Secondary | ICD-10-CM

## 2019-06-10 DIAGNOSIS — R519 Headache, unspecified: Secondary | ICD-10-CM

## 2019-06-10 DIAGNOSIS — H348122 Central retinal vein occlusion, left eye, stable: Secondary | ICD-10-CM | POA: Diagnosis not present

## 2019-06-10 DIAGNOSIS — I6522 Occlusion and stenosis of left carotid artery: Secondary | ICD-10-CM

## 2019-06-10 DIAGNOSIS — Z720 Tobacco use: Secondary | ICD-10-CM

## 2019-06-10 DIAGNOSIS — E782 Mixed hyperlipidemia: Secondary | ICD-10-CM

## 2019-06-10 DIAGNOSIS — H3523 Other non-diabetic proliferative retinopathy, bilateral: Secondary | ICD-10-CM

## 2019-06-10 DIAGNOSIS — I6521 Occlusion and stenosis of right carotid artery: Secondary | ICD-10-CM

## 2019-06-10 HISTORY — DX: Vitreous hemorrhage, left eye: H43.12

## 2019-06-10 MED ORDER — BUTALBITAL-APAP-CAFFEINE 50-325-40 MG PO TABS: 1.0000 | ORAL_TABLET | Freq: Four times a day (QID) | ORAL | 0 refills | Status: DC | PRN

## 2019-06-10 NOTE — Progress Notes (Signed)
Waiting for patient to call with is his insurance information.

## 2019-06-10 NOTE — Progress Notes (Signed)
Subjective: Chief Complaint  Patient presents with  . Follow-up    headache-got injection in eye    Been having headaches, recently after eye treatment.   Eye doctor felt it wasn't related to the injection/medication.   Since 05/28/2019 been getting intermittent headaches daily, lasting for a few hours at a time.   Generally getting 2 headaches per day since 19th.  No associated nausea, vomiting, fever, numbness, tingling.  Endorses some photophobia.  No phonophobia, but its quiet in his home.   Drinks 2-3 cups of coffee daily.  Pain of the headache can be mild, sometimes a lot more moderate.   2 tylenol or 2-3 ibuprofen helps.   Headache not worse with activity.    05/28/2019 saw eye doctor, Dr. Zadie Rhine at [facility] specialist, had injection of medication in eye, lots of headache afterwards.   Took some Tylenol, occasional Advil.  Went back to Dr. Zadie Rhine afterwards.   Saw vascular surgery recently, Dr. Donnetta Hutching.  No treatment available for the left blocked carotid artery.  No other aggravating or relieving factors. No other complaint.  Past Medical History:  Diagnosis Date  . Aortic valve insufficiency 12/15/2014   Moderate by ECHO 12/15/14 normal LV function   . Back pain   . Carotid artery occlusion   . Cataract   . Hyperlipidemia   . Hypertension   . Impaired fasting blood sugar   . Loss of balance   . Peripheral vascular disease ([place])   . Smoker    Current Outpatient Medications on File Prior to Visit  Medication Sig Dispense Refill  . aspirin (ASPIRIN ADULT LOW STRENGTH) 81 MG EC tablet TAKE 1 TABLET (81 MG TOTAL) BY MOUTH DAILY. 90 tablet 3  . atropine 1 % ophthalmic solution Place 1 drop into the left eye daily.    . brimonidine (ALPHAGAN) 0.15 % ophthalmic solution Place 1 drop into the left eye 2 (two) times daily.    Marland Kitchen latanoprost (XALATAN) 0.005 % ophthalmic solution Place 1 drop into the left eye at bedtime.    . nicotine polacrilex (NICORETTE) 2 MG gum Take 1 each (2 mg total) by  mouth as needed for smoking cessation. 60 tablet 1  . Omega-3 Fatty Acids (FISH OIL) 1200 MG CAPS Take by mouth daily.    . rosuvastatin (CRESTOR) 40 MG tablet TAKE 1 TABLET BY MOUTH EVERY DAY 90 tablet 3  . tamsulosin (FLOMAX) 0.4 MG CAPS capsule TAKE 1 CAPSULE BY MOUTH EVERY DAY 90 capsule 3  . TIMOPTIC 0.5 % ophthalmic solution Place 1 drop into the left eye daily.     No current facility-administered medications on file prior to visit.   ROS as in subjective   Objective: BP 140/70   Pulse 63   Temp 97.8 F (36.6 C)   Ht 5\' 8"  (1.727 m)   Wt 154 lb 3.2 oz (69.9 kg)   SpO2 98%   BMI 23.45 kg/m   BP Readings from Last 3 Encounters:  06/10/19 140/70  05/15/19 131/70  04/09/19 136/70    General appearence: alert, no distress, WD/WN,  HEENT: normocephalic, sclerae anicteric, PERRLA, EOMi, nares patent, no discharge or erythema, pharynx normal Oral cavity: MMM, no lesions Neck: supple, no lymphadenopathy, no thyromegaly, no masses, no bruit Heart: 2/6 holosystolic murmur heard best in upper sternal borders, otherwise RRR, normal S1, S2 Lungs: CTA bilaterally, no wheezes, rhonchi, or rales Musculoskeletal: nontender, no swelling, no obvious deformity Extremities: no edema, no cyanosis, no clubbing Pulses: 2+ symmetric, upper and  1+ bilat lower extremities, normal cap refill Neurological: left pupil dilated approx 40mm compared to normal appearing right pupil approx 47mm diameter, EOM intact, otherwise alert, oriented x 3, CN2-12 intact, strength normal upper extremities and lower extremities, sensation normal throughout, DTRs 2+ throughout, no cerebellar signs, gait normal Psychiatric: normal affect, behavior normal, pleasant     Assessment: Encounter Diagnoses  Name Primary?  . New onset headache Yes  . Essential hypertension   . Mild aortic insufficiency   . Retinal vein occlusion of left eye, unspecified retinal vein   . PVD (peripheral vascular disease) (HCC)   .  Occlusion of left carotid artery   . Stenosis of right carotid artery   . Impaired fasting blood sugar   . Mixed hyperlipidemia   . Tobacco abuse   . Vitreous hemorrhage of left eye (HCC)   . Cataract of both eyes, unspecified cataract type   . Proliferative retinopathy, bilateral     Plan: He has new onset headaches in a smoker over age 54 years old.  We will pursue MRI.  In the meantime he can use either Tylenol over-the-counter for mild headaches or the Fioricet prescribed today for worse headache.  Discussed risk and benefits of medication, proper use of medication.  I reviewed his May 14, 2018 cardiology visit notes from Dr. Rosemary Holms.  They plan to do a echocardiogram in 6 months, in the meantime he was continued on statin and aspirin, advised to quit smoking.  He has cut back on tobacco.  I reviewed his May 2020 ophthalmology notes from retina and eye center.  At that time he had diagnoses including ocular ischemic syndrome, vitreous hemorrhage on the left, cataracts in both eyes, nondiabetic proliferative retinopathy.  He was referred to Dr. Nile Riggs who he saw on June 2020.  He recently saw Dr. Luciana Axe at the retina center again and had a recent procedure.  He still has trouble with his left eye primarily.  He has known peripheral vascular disease, known carotid occlusion on the left, significant occlusion on the right carotid, he still is smoking although he has cut down on this, he has a history of hyperlipidemia and hypertension.  Given the new onset headaches, we will go ahead and try to schedule MRI brain  Hypertension-not currently on medication, slightly elevated blood pressure today but recent visits in the chart record the pressure has been okay.  Continue to follow.  Hyperlipidemia-peripheral vascular disease, continue statin Crestor 40 mg daily  Continue aspirin daily  Continue efforts to stop smoking.  He did recently start nicotine patches  I reviewed his blood work  from November 2020 which is stable.  No need for blood work today.  Terry Gross was seen today for follow-up.  Diagnoses and all orders for this visit:  New onset headache -     MR Brain Wo Contrast; Future -     MR Angiogram Head Wo Contrast; Future  Essential hypertension -     MR Brain Wo Contrast; Future -     MR Angiogram Head Wo Contrast; Future  Mild aortic insufficiency  Retinal vein occlusion of left eye, unspecified retinal vein -     MR Brain Wo Contrast; Future -     MR Angiogram Head Wo Contrast; Future  PVD (peripheral vascular disease) (HCC) -     MR Brain Wo Contrast; Future -     MR Angiogram Head Wo Contrast; Future  Occlusion of left carotid artery -     MR Brain  Wo Contrast; Future -     MR Angiogram Head Wo Contrast; Future  Stenosis of right carotid artery -     MR Brain Wo Contrast; Future -     MR Angiogram Head Wo Contrast; Future  Impaired fasting blood sugar  Mixed hyperlipidemia -     MR Brain Wo Contrast; Future  Tobacco abuse -     MR Brain Wo Contrast; Future  Vitreous hemorrhage of left eye (HCC) -     MR Angiogram Head Wo Contrast; Future  Cataract of both eyes, unspecified cataract type -     MR Angiogram Head Wo Contrast; Future  Proliferative retinopathy, bilateral -     MR Angiogram Head Wo Contrast; Future  Other orders -     butalbital-acetaminophen-caffeine (FIORICET) 50-325-40 MG tablet; Take 1-2 tablets by mouth every 6 (six) hours as needed for headache.

## 2019-06-11 ENCOUNTER — Telehealth: Payer: Self-pay | Admitting: Medical

## 2019-06-11 NOTE — Telephone Encounter (Signed)
I'll await your reply after contacting insurance

## 2019-06-11 NOTE — Telephone Encounter (Signed)
Please call Mr. Terry Gross  I spoke to Dr. Luciana Axe his eye doctor today.   We discussed his case.   Let him know I am going to consult with Dr. Arbie Cookey and Dr. Rosemary Holms as well about medication for blood thinner given the eye issues.   I am waiting to hear back from them.   We also need his insurance card ASAP before we can order and schedule the MRI and MRA test.     Of note,  I spoke to Dr. Luciana Axe, retina and diabetic eye specialist yesterday.  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  Terry Gross came in yesterday complaining of new headaches, and Dr. Luciana Axe feels like this is in part due to the ocular angina and ocular ischemia, but there could be other issues as well adding to headaches.    Dr. Luciana Axe felt like he probably needs to be on other anticoagulation besides just plain aspirin given this eye issue.  He recommended either Plavix or Eliquis or other options but wanted to defer to [place].  I am sending this to you both to get some feedback to see if you have a preference for anticoagulation.  I have also ordered MRI and MRA brain, given the fact that he has new headaches in general and is a smoker over 50.

## 2019-06-11 NOTE — Telephone Encounter (Signed)
Spoke to patient and informed him of message from provider. Patient gave several different numbers off the back of his insurance card because he is not sure which one is correct to call for prior auth

## 2019-06-11 NOTE — Telephone Encounter (Signed)
Lmom for patient to call the office to give insurance information and for a message from his provider.

## 2019-06-14 NOTE — Telephone Encounter (Signed)
Phone numbers patient gave me was for [facility]. Waiting for patient to bring insurance card in today.

## 2019-06-18 ENCOUNTER — Other Ambulatory Visit: Payer: Self-pay | Admitting: Medical

## 2019-06-18 ENCOUNTER — Telehealth: Payer: Self-pay

## 2019-06-18 DIAGNOSIS — H4312 Vitreous hemorrhage, left eye: Secondary | ICD-10-CM

## 2019-06-18 DIAGNOSIS — H348122 Central retinal vein occlusion, left eye, stable: Secondary | ICD-10-CM

## 2019-06-18 MED ORDER — CLOPIDOGREL BISULFATE 75 MG PO TABS: 75.0000 mg | ORAL_TABLET | Freq: Every day | ORAL | 11 refills | Status: DC

## 2019-06-18 NOTE — Telephone Encounter (Signed)
Patient brought his insurance information and from what we could see, he did not need an authorization.

## 2019-06-18 NOTE — Telephone Encounter (Signed)
Please call patient as he would like to discuss conversation between you and his other doctors.

## 2019-06-18 NOTE — Telephone Encounter (Signed)
I called and spoke to patient.  I told him to make sure he gets to his correct insurance cards so we can process the request for MRI  I spoke to him about his issue, the conversation I had with his eye doctor, and he is agreeable to begin Plavix for intact platelet properties given his ocular ischemia per Dr. Luciana Axe.  I spoke to Dr. Luciana Axe last week about this case and he recommended he be on something stronger than aspirin to help with his abnormalities.  I also got feedback from his vascular surgeon and the Plavix would be recommended.  So this is what we will use.  We discussed risk and benefits of medication, particularly to call or return right away if unusual bleeding or severe headache  I asked him to return in 1 month for a baseline CBC after starting this medication

## 2019-06-19 ENCOUNTER — Telehealth: Payer: Self-pay

## 2019-06-19 NOTE — Telephone Encounter (Signed)
Pt. Called stating you called him in a prescription for Plavix yesterday and the pharmacists told him to not take his Aspirin 81 mg at the same time as the Plavix. He said he takes his Aspirin at bedtime now, he thought you told him he could take the Plavix at any time but he wanted to check with you to see if he needed to take them at separate times in the day.

## 2019-06-19 NOTE — Telephone Encounter (Signed)
Have him for now just take Plavix and not aspirin, and then we can discuss again in 1 month as long as no bleeding or unusual bruising.

## 2019-06-19 NOTE — Telephone Encounter (Signed)
Pt. Aware to stop Aspirin and take Plavix, pt. Then asked another question about the head ache medicine you called in for him the other day said he took 1 when the head ache came and it helped but seemed like when he sat down it came back about 8 hours later didn't know if there was something else to call in for head aches.

## 2019-06-20 ENCOUNTER — Other Ambulatory Visit: Payer: Self-pay | Admitting: Medical

## 2019-06-20 MED ORDER — HYDROCODONE-ACETAMINOPHEN 5-325 MG PO TABS: 1.0000 | ORAL_TABLET | Freq: Two times a day (BID) | ORAL | 0 refills | Status: DC | PRN

## 2019-06-20 NOTE — Telephone Encounter (Signed)
Provider speaking with patient

## 2019-06-20 NOTE — Telephone Encounter (Signed)
I sent a limited supply of hydrocodone/Norco pain medication he can use up to twice a day for headache short-term.  Have him stop Fioricet since this is not working.  We need to get him set up for the MRI and MRA brain.    I cannot continue prescribing hydrocodone as an ongoing medication.  Part of the  evaluation and treatment at this point is to further evaluate the headaches with the scan

## 2019-07-10 ENCOUNTER — Other Ambulatory Visit: Payer: Self-pay | Admitting: Medical

## 2019-07-10 NOTE — Telephone Encounter (Signed)
Is this appropriate?  

## 2019-07-13 ENCOUNTER — Other Ambulatory Visit: Payer: 59

## 2019-07-17 ENCOUNTER — Other Ambulatory Visit: Payer: Self-pay

## 2019-07-17 DIAGNOSIS — R519 Headache, unspecified: Secondary | ICD-10-CM

## 2019-08-05 ENCOUNTER — Ambulatory Visit
Admission: RE | Admit: 2019-08-05 | Discharge: 2019-08-05 | Disposition: A | Discharge status: Home / Self Care | Payer: 59 | Source: Ambulatory Visit | Attending: Medical | Admitting: Medical

## 2019-08-05 DIAGNOSIS — I10 Essential (primary) hypertension: Secondary | ICD-10-CM

## 2019-08-05 DIAGNOSIS — E782 Mixed hyperlipidemia: Secondary | ICD-10-CM

## 2019-08-05 DIAGNOSIS — R519 Headache, unspecified: Secondary | ICD-10-CM

## 2019-08-05 DIAGNOSIS — I6522 Occlusion and stenosis of left carotid artery: Secondary | ICD-10-CM

## 2019-08-05 DIAGNOSIS — I6521 Occlusion and stenosis of right carotid artery: Secondary | ICD-10-CM

## 2019-08-05 DIAGNOSIS — I739 Peripheral vascular disease, unspecified: Secondary | ICD-10-CM

## 2019-08-05 DIAGNOSIS — H348122 Central retinal vein occlusion, left eye, stable: Secondary | ICD-10-CM

## 2019-08-05 DIAGNOSIS — H269 Unspecified cataract: Secondary | ICD-10-CM

## 2019-08-05 DIAGNOSIS — H3523 Other non-diabetic proliferative retinopathy, bilateral: Secondary | ICD-10-CM

## 2019-08-05 DIAGNOSIS — Z720 Tobacco use: Secondary | ICD-10-CM

## 2019-08-05 DIAGNOSIS — H4312 Vitreous hemorrhage, left eye: Secondary | ICD-10-CM

## 2019-08-14 NOTE — Progress Notes (Signed)
Patient has an appointment scheduled for 08/22/19.

## 2019-08-22 ENCOUNTER — Other Ambulatory Visit: Payer: Self-pay

## 2019-08-22 ENCOUNTER — Encounter: Payer: Self-pay | Admitting: Medical

## 2019-08-22 ENCOUNTER — Ambulatory Visit (INDEPENDENT_AMBULATORY_CARE_PROVIDER_SITE_OTHER): Payer: 59 | Admitting: Medical

## 2019-08-22 VITALS — BP 132/70 | HR 66 | Temp 97.9°F | Ht 68.0 in | Wt 149.4 lb

## 2019-08-22 DIAGNOSIS — H3523 Other non-diabetic proliferative retinopathy, bilateral: Secondary | ICD-10-CM

## 2019-08-22 DIAGNOSIS — I6521 Occlusion and stenosis of right carotid artery: Secondary | ICD-10-CM

## 2019-08-22 DIAGNOSIS — I739 Peripheral vascular disease, unspecified: Secondary | ICD-10-CM

## 2019-08-22 DIAGNOSIS — I350 Nonrheumatic aortic (valve) stenosis: Secondary | ICD-10-CM

## 2019-08-22 DIAGNOSIS — I10 Essential (primary) hypertension: Secondary | ICD-10-CM

## 2019-08-22 DIAGNOSIS — H348122 Central retinal vein occlusion, left eye, stable: Secondary | ICD-10-CM

## 2019-08-22 DIAGNOSIS — Z720 Tobacco use: Secondary | ICD-10-CM

## 2019-08-22 DIAGNOSIS — I6522 Occlusion and stenosis of left carotid artery: Secondary | ICD-10-CM

## 2019-08-22 DIAGNOSIS — I6523 Occlusion and stenosis of bilateral carotid arteries: Secondary | ICD-10-CM

## 2019-08-22 DIAGNOSIS — R634 Abnormal weight loss: Secondary | ICD-10-CM

## 2019-08-22 DIAGNOSIS — Z122 Encounter for screening for malignant neoplasm of respiratory organs: Secondary | ICD-10-CM

## 2019-08-22 DIAGNOSIS — R7989 Other specified abnormal findings of blood chemistry: Secondary | ICD-10-CM

## 2019-08-22 DIAGNOSIS — Z1211 Encounter for screening for malignant neoplasm of colon: Secondary | ICD-10-CM

## 2019-08-22 NOTE — Patient Instructions (Addendum)
Your recent MRI of the brain and the blood vessels in the neck and brain do not show any new mass, tumor, aneurysm or other.  It just shows what we already knew about the completely blocked left internal carotid artery and there has been some subsequent new blood vessel formation in that area of the brain that the left carotid artery would have supplied.   Recommendations  Tobacco use I strongly recommend you quit smoking.  You have significant findings including blockages in your carotid arteries, vascular disease, and when you continue to smoke this continues to worsen your risk of adverse events  Abnormal thyroid lab, weight loss You have had abnormal thyroid labs in the past.  We are rechecking this today.  You saw an endocrinologist back in 2018 for this.  Given your recent weight loss I am rechecking this today as well as some other labs.  If you continue to notice weight loss in the next 2 to 3 weeks let me know   Lung cancer screening We are going to refer you for updated lung cancer screen with chest CT.  It is recommended you have a chest CT lung cancer screen yearly given your long history of tobacco use.   Colon cancer screening You have not had a colon cancer screen.  We discussed colonoscopy versus stool test called the Cologuard today.  You were agreeable to Cologuard.  We will refer you for this    Aortic stenosis, peripheral arterial disease You have valve disease of the aortic valve as well as peripheral arterial disease.  You saw the cardiologist in January 2021, Dr. Rosemary Holms.  It was advised for you to repeat echocardiogram in 6 months around July.  You advised to continue rosuvastatin Crestor as well as Plavix given the retinal artery occlusion and risk factors for stroke and heart disease.  You saw Dr. Arbie Cookey in December 2020.  They plan to repeat carotid ultrasounds in 1 year.  You still have blockages in the right carotid, and your left carotid is completely blocked  and there is no specific treatment that can help this.   Prostate cancer screening  You are up-to-date on prostate cancer screening from a few months ago and the prostate lab was normal.

## 2019-08-22 NOTE — Progress Notes (Signed)
Subjective:  Terry Gross is a 54 y.o. male who presents for Chief Complaint  Patient presents with  . Follow-up    MRI      Here today to follow-up on recent MRI, MRA brain.  This was initially ordered for severe new onset of headaches, left retinal artery occlusion, left internal carotid artery occlusion, right carotid artery stenosis, tobacco use  He notes that after a short period of using the medication I prescribed last visit his headaches basically resolved.  He was also given some topical eyedrops for pain per Dr. Luciana Axe which have helped as he was having a lot of eye pain as well.  He denies any sinus issues, head congestion, runny nose.  He is eating 1 pill/day and exercising regularly.  He has lost some weight since January that he was not particularly aware of.  He denies blood in the stool, no change in appetite, no fevers no night sweats.  He continues to smoke but has cut back  He was at [employer] a few days ago and bumped his left hand on the buggy and is in a shock light way down his arm.  He is concerned about this.  The symptoms only lasted for seconds.  No current pain or numbness or tingling or weakness  No other aggravating or relieving factors.    No other c/o.  Past Medical History:  Diagnosis Date  . Aortic valve insufficiency 12/15/2014   Moderate by ECHO 12/15/14 normal LV function   . Back pain   . Carotid artery occlusion   . Cataract   . Hyperlipidemia   . Hypertension   . Impaired fasting blood sugar   . Loss of balance   . Peripheral vascular disease (HCC)   . Smoker    Current Outpatient Medications on File Prior to Visit  Medication Sig Dispense Refill  . atropine 1 % ophthalmic solution Place 1 drop into the left eye daily.    . brimonidine (ALPHAGAN) 0.15 % ophthalmic solution Place 1 drop into the left eye 2 (two) times daily.    . clopidogrel (PLAVIX) 75 MG tablet Take 1 tablet (75 mg total) by mouth daily. 30 tablet 11  . latanoprost  (XALATAN) 0.005 % ophthalmic solution Place 1 drop into the left eye at bedtime.    . nicotine polacrilex (NICORETTE) 2 MG gum Take 1 each (2 mg total) by mouth as needed for smoking cessation. 60 tablet 1  . Omega-3 Fatty Acids (FISH OIL) 1200 MG CAPS Take by mouth daily.    . rosuvastatin (CRESTOR) 40 MG tablet TAKE 1 TABLET BY MOUTH EVERY DAY 90 tablet 3  . tamsulosin (FLOMAX) 0.4 MG CAPS capsule TAKE 1 CAPSULE BY MOUTH EVERY DAY 90 capsule 3  . TIMOPTIC 0.5 % ophthalmic solution Place 1 drop into the left eye daily.    Marland Kitchen HYDROcodone-acetaminophen (NORCO) 5-325 MG tablet Take 1 tablet by mouth 2 (two) times daily as needed (for pain/headache). (Patient not taking: Reported on 08/22/2019) 12 tablet 0   No current facility-administered medications on file prior to visit.    Past Surgical History:  Procedure Laterality Date  . CATARACT EXTRACTION      The following portions of the patient's history were reviewed and updated as appropriate: allergies, current medications, past family history, past medical history, past social history, past surgical history and problem list.  ROS Otherwise as in subjective above    Objective: BP 132/70   Pulse 66   Temp 97.9  F (36.6 C)   Ht 5\' 8"  (1.727 m)   Wt 149 lb 6.4 oz (67.8 kg)   SpO2 99%   BMI 22.72 kg/m   General appearance: alert, no distress, well developed, well nourished No scleral icterus Oral cavity: MMM, no lesions Neck: supple, no lymphadenopathy, no thyromegaly, no masses Heart: 2/6 holosytolic murmur heard best in upper sternal borders, othwrise  normal S1, S2 Lungs: CTA bilaterally, no wheezes, rhonchi, or rales Abdomen: +bs, soft, non tender, non distended, no masses, no hepatomegaly, no splenomegaly Pulses: 2+ radial pulses, 2+ pedal pulses, normal cap refill Ext: no edema Hands arms and wrist nontender, normal range of motion, no obvious deformity Arms neurovascularly intact, negative Tinel's and  Phalen's    Assessment: Encounter Diagnoses  Name Primary?  . 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 Retinal vein occlusion of left eye, unspecified retinal vein   . PVD (peripheral vascular disease) ([place])   . Moderate aortic stenosis   . Occlusion of left carotid artery   . Stenosis of right carotid artery   . Tobacco abuse   . Abnormal thyroid blood test   . Screen for colon cancer   . Proliferative retinopathy, bilateral   . Essential hypertension   . Weight loss   . Encounter for screening for lung cancer      Plan: We discussed his recent MRA and MRI of the brain.  I am glad his headaches have resolved  We discussed his current symptoms regarding the jolt-like sensation in his left arm which I think was just some stimulation of the median nerve that has resolved.    I reviewed over his chart record, recent visits with cardiology, vascular surgery, and eye doctor.  We discussed the numerous recommendations as below.  Patient Instructions  Your recent MRI of the brain and the blood vessels in the neck and brain do not show any new mass, tumor, aneurysm or other.  It just shows what we already knew about the completely blocked left internal carotid artery and there has been some subsequent new blood vessel formation in that area of the brain that the left carotid artery would have supplied.   Recommendations  Tobacco use I strongly recommend you quit smoking.  You have significant findings including blockages in your carotid arteries, vascular disease, and when you continue to smoke this continues to worsen your risk of adverse events  Abnormal thyroid lab, weight loss You have had abnormal thyroid labs in the past.  We are rechecking this today.  You saw an endocrinologist back in 2018 for this.  Given your recent weight loss I am rechecking this today as well as some other labs.  If you continue to notice weight loss in the next 2 to 3 weeks let me  know   Lung cancer screening We are going to refer you for updated lung cancer screen with chest CT.  It is recommended you have a chest CT lung cancer screen yearly given your long history of tobacco use.   Colon cancer screening You have not had a colon cancer screen.  We discussed colonoscopy versus stool test called the Cologuard today.  You were agreeable to Cologuard.  We will refer you for this    Aortic stenosis, peripheral arterial disease You have valve disease of the aortic valve as well as peripheral arterial disease.  You saw the cardiologist in January 2021, Dr. Virgina Jock.  It was advised for you to repeat echocardiogram in  6 months around July.  You advised to continue rosuvastatin Crestor as well as Plavix given the retinal artery occlusion and risk factors for stroke and heart disease.  You saw Dr. Arbie Cookey in December 2020.  They plan to repeat carotid ultrasounds in 1 year.  You still have blockages in the right carotid, and your left carotid is completely blocked and there is no specific treatment that can help this.   Prostate cancer screening  You are up-to-date on prostate cancer screening from a few months ago and the prostate lab was normal.       Jacobey was seen today for follow-up.  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  Retinal vein occlusion of left eye, unspecified retinal vein  PVD (peripheral vascular disease) (HCC)  Moderate aortic stenosis  Occlusion of left carotid artery  Stenosis of right carotid artery  Tobacco abuse -     CT CHEST LUNG CA SCREEN LOW DOSE W/O CM; Future  Abnormal thyroid blood test -     TSH -     T4, free -     T3  Screen for colon cancer  Proliferative retinopathy, bilateral  Essential hypertension  Weight loss -     TSH -     T4, free -     T3 -     CBC with Differential/Platelet -     Comprehensive metabolic panel -     CT CHEST LUNG CA SCREEN LOW  DOSE W/O CM; Future  Encounter for screening for lung cancer    Follow up: Pending labs, CT chest

## 2019-08-22 NOTE — Addendum Note (Signed)
Addended by: Victorio Palm on: 08/22/2019 02:18 PM   Modules accepted: Orders

## 2019-08-23 ENCOUNTER — Telehealth: Payer: Self-pay | Admitting: Medical

## 2019-08-23 LAB — CBC WITH DIFFERENTIAL/PLATELET
Basophils Absolute: 0 10*3/uL (ref 0.0–0.2)
Basos: 0 %
EOS (ABSOLUTE): 0.1 10*3/uL (ref 0.0–0.4)
Eos: 2 %
Hematocrit: 43.9 % (ref 37.5–51.0)
Hemoglobin: 15 g/dL (ref 13.0–17.7)
Immature Grans (Abs): 0 10*3/uL (ref 0.0–0.1)
Immature Granulocytes: 0 %
Lymphocytes Absolute: 1.6 10*3/uL (ref 0.7–3.1)
Lymphs: 27 %
MCH: 32.6 pg (ref 26.6–33.0)
MCHC: 34.2 g/dL (ref 31.5–35.7)
MCV: 95 fL (ref 79–97)
Monocytes Absolute: 0.7 10*3/uL (ref 0.1–0.9)
Monocytes: 11 %
Neutrophils Absolute: 3.6 10*3/uL (ref 1.4–7.0)
Neutrophils: 60 %
Platelets: 221 10*3/uL (ref 150–450)
RBC: 4.6 x10E6/uL (ref 4.14–5.80)
RDW: 12.9 % (ref 11.6–15.4)
WBC: 6 10*3/uL (ref 3.4–10.8)

## 2019-08-23 LAB — COMPREHENSIVE METABOLIC PANEL
ALT: 16 IU/L (ref 0–44)
AST: 21 IU/L (ref 0–40)
Albumin/Globulin Ratio: 1.8 (ref 1.2–2.2)
Albumin: 4.6 g/dL (ref 3.8–4.9)
Alkaline Phosphatase: 64 IU/L (ref 39–117)
BUN/Creatinine Ratio: 14 (ref 9–20)
BUN: 9 mg/dL (ref 6–24)
Bilirubin Total: 0.5 mg/dL (ref 0.0–1.2)
CO2: 24 mmol/L (ref 20–29)
Calcium: 9.3 mg/dL (ref 8.7–10.2)
Chloride: 104 mmol/L (ref 96–106)
Creatinine, Ser: 0.65 mg/dL — ABNORMAL LOW (ref 0.76–1.27)
GFR calc Af Amer: 126 mL/min/{1.73_m2} (ref >59)
GFR calc non Af Amer: 109 mL/min/{1.73_m2} (ref >59)
Globulin, Total: 2.5 g/dL (ref 1.5–4.5)
Glucose: 94 mg/dL (ref 65–99)
Potassium: 4.3 mmol/L (ref 3.5–5.2)
Sodium: 142 mmol/L (ref 134–144)
Total Protein: 7.1 g/dL (ref 6.0–8.5)

## 2019-08-23 LAB — TSH: TSH: 0.376 u[IU]/mL — ABNORMAL LOW (ref 0.450–4.500)

## 2019-08-23 LAB — T3: T3, Total: 127 ng/dL (ref 71–180)

## 2019-08-23 LAB — T4, FREE: Free T4: 1.43 ng/dL (ref 0.82–1.77)

## 2019-08-23 NOTE — Telephone Encounter (Signed)
Pt. Aware of results and recommendations. He also canceled his apt with neurology since he is not having head aches anymore.

## 2019-08-23 NOTE — Telephone Encounter (Signed)
I reached out to Dr. Lucianne Muss, the endocrinologist you saw back in 2018.  I asked him to review your labs which are stable and he did not feel you need any other work-up at this time related to thyroid.   We feel like the TSH brain hormone is probably showing falsely low.

## 2019-08-23 NOTE — Telephone Encounter (Signed)
Okay, make sure we send that is to neurology that he is canceling the appointment so they are not left hanging

## 2019-08-29 ENCOUNTER — Encounter (INDEPENDENT_AMBULATORY_CARE_PROVIDER_SITE_OTHER): Payer: 59 | Admitting: Ophthalmology

## 2019-09-04 ENCOUNTER — Ambulatory Visit: Payer: 59 | Admitting: Neurology

## 2019-09-12 ENCOUNTER — Ambulatory Visit (INDEPENDENT_AMBULATORY_CARE_PROVIDER_SITE_OTHER): Payer: 59 | Admitting: Ophthalmology

## 2019-09-12 ENCOUNTER — Other Ambulatory Visit: Payer: Self-pay

## 2019-09-12 ENCOUNTER — Encounter (INDEPENDENT_AMBULATORY_CARE_PROVIDER_SITE_OTHER): Payer: Self-pay | Admitting: Ophthalmology

## 2019-09-12 DIAGNOSIS — H3522 Other non-diabetic proliferative retinopathy, left eye: Secondary | ICD-10-CM

## 2019-09-12 DIAGNOSIS — H211X2 Other vascular disorders of iris and ciliary body, left eye: Secondary | ICD-10-CM

## 2019-09-12 DIAGNOSIS — H35372 Puckering of macula, left eye: Secondary | ICD-10-CM

## 2019-09-12 DIAGNOSIS — H3412 Central retinal artery occlusion, left eye: Secondary | ICD-10-CM

## 2019-09-12 HISTORY — DX: Puckering of macula, left eye: H35.372

## 2019-09-12 NOTE — Progress Notes (Signed)
09/12/2019     CHIEF COMPLAINT Patient presents for Retina Follow Up   HISTORY OF PRESENT ILLNESS: Terry Gross is a 54 y.o. male who presents to the clinic today for:   HPI    Retina Follow Up    Patient presents with  Other (Central Retinal Artery Occlusion OS).  In left eye.  Severity is moderate.  Duration of 6 weeks.  Since onset it is gradually improving.          Comments    6 Week Central Retinal Artery Occlusion f\u OS. FP  Pt states vision is stable. Pt had a MRI about 5 weeks ago.  Patient reports that there is no treatable findings on his recent MRI MRA.  Using gtts as directed.  Patient's last injection for neovascular complications of a central retinal artery occlusion were of January 2021 OS.,  Intraocular pressure remained stable       Last edited by Edmon Crape, MD on 09/12/2019  9:16 AM. (History)      Referring physician: Jac Canavan, PA-C [address] [place],  [place] [address]  HISTORICAL INFORMATION:   Selected notes from the MEDICAL RECORD NUMBER    Lab Results  Component Value Date   HGBA1C 5.8 (H) 03/13/2019     CURRENT MEDICATIONS: Current Outpatient Medications (Ophthalmic Drugs)  Medication Sig  . atropine 1 % ophthalmic solution Place 1 drop into the left eye daily.  . brimonidine (ALPHAGAN) 0.15 % ophthalmic solution Place 1 drop into the left eye 2 (two) times daily.  Marland Kitchen latanoprost (XALATAN) 0.005 % ophthalmic solution Place 1 drop into the left eye at bedtime.  Marland Kitchen TIMOPTIC 0.5 % ophthalmic solution Place 1 drop into the left eye daily.   No current facility-administered medications for this visit. (Ophthalmic Drugs)   Current Outpatient Medications (Other)  Medication Sig  . clopidogrel (PLAVIX) 75 MG tablet Take 1 tablet (75 mg total) by mouth daily.  Marland Kitchen HYDROcodone-acetaminophen (NORCO) 5-325 MG tablet Take 1 tablet by mouth 2 (two) times daily as needed (for pain/headache). (Patient not taking: Reported on  08/22/2019)  . nicotine polacrilex (NICORETTE) 2 MG gum Take 1 each (2 mg total) by mouth as needed for smoking cessation.  . Omega-3 Fatty Acids (FISH OIL) 1200 MG CAPS Take by mouth daily.  . rosuvastatin (CRESTOR) 40 MG tablet TAKE 1 TABLET BY MOUTH EVERY DAY  . tamsulosin (FLOMAX) 0.4 MG CAPS capsule TAKE 1 CAPSULE BY MOUTH EVERY DAY   No current facility-administered medications for this visit. (Other)      REVIEW OF SYSTEMS:    ALLERGIES No Known Allergies  PAST MEDICAL HISTORY Past Medical History:  Diagnosis Date  . Aortic valve insufficiency 12/15/2014   Moderate by ECHO 12/15/14 normal LV function   . Back pain   . Carotid artery occlusion   . Cataract   . Hyperlipidemia   . Hypertension   . Impaired fasting blood sugar   . Loss of balance   . Peripheral vascular disease (HCC)   . Smoker    Past Surgical History:  Procedure Laterality Date  . CATARACT EXTRACTION      FAMILY HISTORY Family History  Problem Relation Age of Onset  . CVA Mother   . Other Mother        brain tumor  . Other Father        complications from prostate sugery  . Cancer Father        prostate  . Thyroid  disease Neg Hx   . Heart disease Neg Hx   . Diabetes Neg Hx   . Kidney disease Neg Hx   . Hypertension Neg Hx   . Hyperlipidemia Neg Hx     SOCIAL HISTORY Social History   Tobacco Use  . Smoking status: Current Every Day Smoker    Packs/day: 0.25    Years: 32.00    Pack years: 8.00    Types: Cigarettes  . Smokeless tobacco: Never Used  Substance Use Topics  . Alcohol use: No  . Drug use: No         OPHTHALMIC EXAM: Base Eye Exam    Visual Acuity (ETDRS)      Right Left   Dist [place] 20/25 -1 E Card @ 6'       Tonometry (Tonopen, 9:12 AM)      Right Left   Pressure 15 16       Pupils      Pupils Dark Light Shape React APD   Right PERRL 3 3 Round Minimal None   Left PERRL 6 6 Round Minimal None       Visual Fields (Counting fingers)      Left Right      Full   Restrictions Total superior temporal, superior nasal deficiencies        Neuro/Psych    Oriented x3: Yes   Mood/Affect: Normal        Slit Lamp and Fundus Exam    External Exam      Right Left   External Normal Normal       Slit Lamp Exam      Right Left   Lids/Lashes  Normal   Conjunctiva/Sclera  White and quiet   Cornea  Clear   Anterior Chamber  Deep and quiet   Iris  Neovascularization,.  Pupillary as well as prominently inferonasal.   Lens 2+ Nuclear sclerosis Posterior chamber intraocular lens   Anterior Vitreous  Normal       Fundus Exam      Right Left   Posterior Vitreous  Clear, vitrectomized   Disc  2+ Pallor   C/D Ratio  0.75   Macula  Microaneurysms   Vessels  Old central retinal artery occlusion with clear evidence of retinal nonperfusion peripherally.,  No neovascularization today   Periphery  Good PRP, 360,          IMAGING AND PROCEDURES  Imaging and Procedures for 09/12/19  Color Fundus Photography Optos - OU - Both Eyes       Right Eye Progression has been stable. Disc findings include normal observations. Macula : normal observations. Vessels : normal observations. Periphery : normal observations.   Left Eye Progression has improved. Disc findings include pallor.   Notes OS with optic nerve pallor.  Enlarged cup.  Retinal nonperfusion peripherally.,  Good PRP, no new neovascular complications.  Boxcarringo f the retinal vein is slightly improved                ASSESSMENT/PLAN:  No problem-specific Assessment & Plan notes found for this encounter.      ICD-10-CM   1. Central retinal artery occlusion of left eye  H34.12 Color Fundus Photography Optos - OU - Both Eyes  2. 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 Nondiabetic proliferative retinopathy, left  H35.22     1.  OS has a history of old central retinal artery occlusion, ocular  ischemic syndrome and secondary iris  neovascularization and for a period of time had elevated intraocular pressures.  Now the condition the left eye is remained stable and without the use of antiveg F injection OS.  Thus we will continue to observe his intraocular pressures controlled.  There is been no medial opacification from vitreous hemorrhage or hyphema.  I have encouraged most often not to rub or mash the eye.  2.  He continues on topical medications.  3.  Ophthalmic Meds Ordered this visit:  No orders of the defined types were placed in this encounter.  Continue Timoptic topically 1 drop OS daily  Continue brimonidine 1 drop twice daily OS daily.  Patient can 10 used to use atropine topically OS only on a as needed basis.  The patient may continue use latanoprost once nightly as well.    No follow-ups on file.  There are no Patient Instructions on file for this visit.   Explained the diagnoses, plan, and follow up with the patient and they expressed understanding.  Patient expressed understanding of the importance of proper follow up care.   Clent Demark Stclair Szymborski M.D. Diseases & Surgery of the Retina and Vitreous [facility] [place] 09/12/19     Abbreviations: M myopia (nearsighted); A astigmatism; H hyperopia (farsighted); P presbyopia; Mrx spectacle prescription;  CTL contact lenses; OD right eye; OS left eye; OU both eyes  XT exotropia; ET esotropia; PEK punctate epithelial keratitis; PEE punctate epithelial erosions; DES dry eye syndrome; MGD meibomian gland dysfunction; ATs artificial tears; PFAT's preservative free artificial tears; [place] nuclear sclerotic cataract; PSC posterior subcapsular cataract; ERM epi-retinal membrane; PVD posterior vitreous detachment; RD retinal detachment; DM diabetes mellitus; DR diabetic retinopathy; NPDR non-proliferative diabetic retinopathy; PDR proliferative diabetic retinopathy; CSME clinically significant macular edema; DME diabetic macular edema; dbh dot blot  hemorrhages; CWS cotton wool spot; POAG primary open angle glaucoma; C/D cup-to-disc ratio; HVF humphrey visual field; GVF goldmann visual field; OCT optical coherence tomography; IOP intraocular pressure; BRVO Branch retinal vein occlusion; CRVO central retinal vein occlusion; CRAO central retinal artery occlusion; BRAO branch retinal artery occlusion; RT retinal tear; SB scleral buckle; PPV pars plana vitrectomy; VH Vitreous hemorrhage; PRP panretinal laser photocoagulation; IVK intravitreal kenalog; VMT vitreomacular traction; MH Macular hole;  NVD neovascularization of the disc; NVE neovascularization elsewhere; AREDS age related eye disease study; ARMD age related macular degeneration; POAG primary open angle glaucoma; EBMD epithelial/anterior basement membrane dystrophy; ACIOL anterior chamber intraocular lens; IOL intraocular lens; PCIOL posterior chamber intraocular lens; Phaco/IOL phacoemulsification with intraocular lens placement; [place] photorefractive keratectomy; LASIK laser assisted in situ keratomileusis; HTN hypertension; DM diabetes mellitus; COPD chronic obstructive pulmonary disease

## 2019-09-19 ENCOUNTER — Ambulatory Visit
Admission: RE | Admit: 2019-09-19 | Discharge: 2019-09-19 | Disposition: A | Discharge status: Home / Self Care | Payer: 59 | Source: Ambulatory Visit | Attending: Medical | Admitting: Medical

## 2019-09-19 DIAGNOSIS — Z720 Tobacco use: Secondary | ICD-10-CM

## 2019-09-19 DIAGNOSIS — R634 Abnormal weight loss: Secondary | ICD-10-CM

## 2019-10-06 ENCOUNTER — Other Ambulatory Visit: Payer: Self-pay | Admitting: Medical

## 2019-10-08 ENCOUNTER — Other Ambulatory Visit: Payer: 59

## 2019-10-15 ENCOUNTER — Ambulatory Visit (INDEPENDENT_AMBULATORY_CARE_PROVIDER_SITE_OTHER): Payer: 59 | Admitting: Ophthalmology

## 2019-10-15 ENCOUNTER — Encounter (INDEPENDENT_AMBULATORY_CARE_PROVIDER_SITE_OTHER): Payer: Self-pay | Admitting: Ophthalmology

## 2019-10-15 ENCOUNTER — Other Ambulatory Visit: Payer: Self-pay

## 2019-10-15 DIAGNOSIS — H2102 Hyphema, left eye: Secondary | ICD-10-CM

## 2019-10-15 DIAGNOSIS — H3522 Other non-diabetic proliferative retinopathy, left eye: Secondary | ICD-10-CM

## 2019-10-15 DIAGNOSIS — H348121 Central retinal vein occlusion, left eye, with retinal neovascularization: Secondary | ICD-10-CM

## 2019-10-15 MED ORDER — BEVACIZUMAB CHEMO INJECTION 1.25MG/0.05ML SYRINGE FOR KALEIDOSCOPE
1.2500 mg | INTRAVITREAL | Status: AC | PRN
  Administered 2019-10-15: 1.25 mg via INTRAVITREAL

## 2019-10-15 NOTE — Assessment & Plan Note (Signed)
Ms. the underlying condition with commenced also with central retinal artery occlusion and then nondiabetic proliferative retinopathy leading to anterior segment neovascularization, hyphema and ocular ischemia with hyphema without elevated intraocular pressures with neovascularization of the iris

## 2019-10-15 NOTE — Progress Notes (Signed)
10/15/2019     CHIEF COMPLAINT Patient presents for Retina Evaluation   HISTORY OF PRESENT ILLNESS: [place] is a 54 y.o. male who presents to the clinic today for:   HPI    Retina Evaluation    In both eyes.  This started 3 days ago.  Associated Symptoms Negative for Floaters and Flashes.  Context:  distance vision and near vision.  Treatments tried include no treatments.          Comments    Patient states since Friday, the vision in his left eye has been very blurry. Patient states today is a bit better.  LBS 180 A1C unknown       Last edited by Berenice Bouton on 10/15/2019  9:42 AM. (History)      Referring physician: Jac Canavan, PA-C [address] [place],  [place] [address]  HISTORICAL INFORMATION:   Selected notes from the MEDICAL RECORD NUMBER    Lab Results  Component Value Date   HGBA1C 5.8 (H) 03/13/2019     CURRENT MEDICATIONS: Current Outpatient Medications (Ophthalmic Drugs)  Medication Sig  . atropine 1 % ophthalmic solution Place 1 drop into the left eye daily.  . brimonidine (ALPHAGAN) 0.15 % ophthalmic solution Place 1 drop into the left eye 2 (two) times daily.  Marland Kitchen latanoprost (XALATAN) 0.005 % ophthalmic solution Place 1 drop into the left eye at bedtime.  . Timolol Maleate 0.5 % (DAILY) SOLN Place 1 drop into the right eye every morning.  Marland Kitchen TIMOPTIC 0.5 % ophthalmic solution Place 1 drop into the left eye daily.   No current facility-administered medications for this visit. (Ophthalmic Drugs)   Current Outpatient Medications (Other)  Medication Sig  . clopidogrel (PLAVIX) 75 MG tablet Take 1 tablet (75 mg total) by mouth daily.  Marland Kitchen HYDROcodone-acetaminophen (NORCO) 5-325 MG tablet Take 1 tablet by mouth 2 (two) times daily as needed (for pain/headache). (Patient not taking: Reported on 08/22/2019)  . nicotine polacrilex (NICORETTE) 2 MG gum Take 1 each (2 mg total) by mouth as needed for smoking cessation.  . Omega-3 Fatty  Acids (FISH OIL) 1200 MG CAPS Take by mouth daily.  . rosuvastatin (CRESTOR) 40 MG tablet TAKE 1 TABLET BY MOUTH EVERY DAY  . tamsulosin (FLOMAX) 0.4 MG CAPS capsule TAKE 1 CAPSULE BY MOUTH EVERY DAY   No current facility-administered medications for this visit. (Other)      REVIEW OF SYSTEMS:    ALLERGIES No Known Allergies  PAST MEDICAL HISTORY Past Medical History:  Diagnosis Date  . Aortic valve insufficiency 12/15/2014   Moderate by ECHO 12/15/14 normal LV function   . Back pain   . Carotid artery occlusion   . Cataract   . Hyperlipidemia   . Hypertension   . Impaired fasting blood sugar   . Loss of balance   . Peripheral vascular disease (HCC)   . Smoker    Past Surgical History:  Procedure Laterality Date  . CATARACT EXTRACTION      FAMILY HISTORY Family History  Problem Relation Age of Onset  . CVA Mother   . Other Mother        brain tumor  . Other Father        complications from prostate sugery  . Cancer Father        prostate  . Thyroid disease Neg Hx   . Heart disease Neg Hx   . Diabetes Neg Hx   . Kidney disease Neg Hx   .  Hypertension Neg Hx   . Hyperlipidemia Neg Hx     SOCIAL HISTORY Social History   Tobacco Use  . Smoking status: Current Every Day Smoker    Packs/day: 0.25    Years: 32.00    Pack years: 8.00    Types: Cigarettes  . Smokeless tobacco: Never Used  Substance Use Topics  . Alcohol use: No  . Drug use: No         OPHTHALMIC EXAM:  Base Eye Exam    Visual Acuity (Snellen - Linear)      Right Left   Dist [place] 20/20-1 CF @ 1'   Dist ph [place]  NI       Tonometry (Tonopen, 9:46 AM)      Right Left   Pressure 15 18       Pupils      Pupils Dark Light Shape React APD   Right PERRL 3 3 Round Minimal None   Left PERRL 6 6 Round Minimal None       Extraocular Movement      Right Left    Full Full       Neuro/Psych    Oriented x3: Yes   Mood/Affect: Normal       Dilation    Left eye: 1.0% Mydriacyl, 2.5%  Phenylephrine @ 9:46 AM        Slit Lamp and Fundus Exam    External Exam      Right Left   External Normal Normal       Slit Lamp Exam      Right Left   Lids/Lashes Normal Normal   Conjunctiva/Sclera White and quiet White and quiet   Cornea Clear Clear   Anterior Chamber Deep and quiet 2+ Cell , Hyphema, Microhyphema   Iris Round and reactive Neovascularization, old and from ocular ischemia   Lens 2+ Nuclear sclerosis Posterior chamber intraocular lens   Anterior Vitreous Normal Normal       Fundus Exam      Right Left   Posterior Vitreous  Clear, vitrectomized   Disc  2+ Pallor   C/D Ratio  0.75   Macula  Microaneurysms   Vessels  Old central retinal artery occlusion with clear evidence of retinal nonperfusion peripherally.,  No neovascularization today   Periphery  Good PRP, 360,          IMAGING AND PROCEDURES  Imaging and Procedures for 10/15/19  OCT, Retina - OU - Both Eyes       Right Eye Quality was good. Scan locations included subfoveal. Central Foveal Thickness: 239. Findings include normal observations.   Left Eye Quality was good. Scan locations included subfoveal. Central Foveal Thickness: 275. Progression has been stable.   Notes OD, stable  OS with no increase in macular thickening.  Diffuse medial opacity is likely from the new hyphema anteriorly.       Intravitreal Injection, Pharmacologic Agent - OS - Left Eye       Time Out 10/15/2019. 11:07 AM. Confirmed correct patient, procedure, site, and patient consented.   Anesthesia Topical anesthesia was used. Anesthetic medications included Akten 3.5%.   Procedure Preparation included Ofloxacin . A 30 gauge needle was used.   Injection:  1.25 mg Bevacizumab (AVASTIN) SOLN   NDC: [address]-2, Lot: [id number]   Route: Intravitreal, Site: Left Eye, Waste: 0 mg  Post-op Post injection exam found visual acuity of at least counting fingers. The patient tolerated the procedure well. There were  no complications. The  patient received written and verbal post procedure care education. Post injection medications were not given.   Notes AC Tap performed OS       Aspiration of Aqueous, Diagnostic - OS - Left Eye       Post injection of intravitreal antiveg F, Avastin, to equilibrate the intraocular pressure of the left eye was required.Marland Kitchen  Antibiotics have been applied.  Lid speculum applied.  0.1 cc removed atraumatically from the anterior chamber.  Notable findings were near 360 large vascular neovascular fronds are now also noted.  No new hemorrhage occurred.  Gentle pressure was soft                ASSESSMENT/PLAN:  Retinal vein occlusion of left eye Ms. the underlying condition with commenced also with central retinal artery occlusion and then nondiabetic proliferative retinopathy leading to anterior segment neovascularization, hyphema and ocular ischemia with hyphema without elevated intraocular pressures with neovascularization of the iris      ICD-10-CM   1. Nondiabetic proliferative retinopathy, left  H35.22 Intravitreal Injection, Pharmacologic Agent - OS - Left Eye    Bevacizumab (AVASTIN) SOLN 1.25 mg  2. Hyphema, left eye  H21.02 Aspiration of Aqueous, Diagnostic - OS - Left Eye  3. 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  Anterior chamber tap also performed today left eye.  3.  Patient understands the critical diagnosis and prognosis for the left eye.  We are attempting no edema can to salvage the globe from the proximal vascular abnormality leading to ocular ischemic syndrome  Ophthalmic Meds Ordered this visit:  Meds  ordered this encounter  Medications  . Bevacizumab (AVASTIN) SOLN 1.25 mg       Return in about 1 month (around 11/14/2019) for AVASTIN OCT, OS.  There are no Patient Instructions on file for this visit.   Explained the diagnoses, plan, and follow up with the patient and they expressed understanding.  Patient expressed understanding of the importance of proper follow up care.   Alford Highland Shota Kohrs M.D. Diseases & Surgery of the Retina and Vitreous [facility] 10/15/19     Abbreviations: M myopia (nearsighted); A astigmatism; H hyperopia (farsighted); P presbyopia; Mrx spectacle prescription;  CTL contact lenses; OD right eye; OS left eye; OU both eyes  XT exotropia; ET esotropia; PEK punctate epithelial keratitis; PEE punctate epithelial erosions; DES dry eye syndrome; MGD meibomian gland dysfunction; ATs artificial tears; PFAT's preservative free artificial tears; NSC nuclear sclerotic cataract; PSC posterior subcapsular cataract; ERM epi-retinal membrane; PVD posterior vitreous detachment; RD retinal detachment; DM diabetes mellitus; DR diabetic retinopathy; NPDR non-proliferative diabetic retinopathy; PDR proliferative diabetic retinopathy; CSME clinically significant macular edema; DME diabetic macular edema; dbh dot blot hemorrhages; CWS cotton wool spot; POAG primary open angle glaucoma; C/D cup-to-disc ratio; HVF humphrey visual field; GVF goldmann visual field; OCT optical coherence tomography; IOP intraocular pressure; BRVO Branch retinal vein occlusion; CRVO central retinal vein occlusion; CRAO central retinal artery occlusion; BRAO branch retinal artery occlusion; RT retinal tear; SB scleral buckle; PPV pars plana vitrectomy; VH Vitreous hemorrhage; PRP panretinal laser photocoagulation; IVK intravitreal kenalog; VMT vitreomacular traction; MH Macular hole;  NVD  neovascularization of the disc; NVE neovascularization elsewhere; AREDS age related eye disease study; ARMD age  related macular degeneration; POAG primary open angle glaucoma; EBMD epithelial/anterior basement membrane dystrophy; ACIOL anterior chamber intraocular lens; IOL intraocular lens; PCIOL posterior chamber intraocular lens; Phaco/IOL phacoemulsification with intraocular lens placement; [place] photorefractive keratectomy; LASIK laser assisted in situ keratomileusis; HTN hypertension; DM diabetes mellitus; COPD chronic obstructive pulmonary disease

## 2019-10-16 ENCOUNTER — Ambulatory Visit: Payer: 59

## 2019-10-16 DIAGNOSIS — I350 Nonrheumatic aortic (valve) stenosis: Secondary | ICD-10-CM

## 2019-10-16 DIAGNOSIS — I351 Nonrheumatic aortic (valve) insufficiency: Secondary | ICD-10-CM

## 2019-10-24 ENCOUNTER — Encounter (INDEPENDENT_AMBULATORY_CARE_PROVIDER_SITE_OTHER): Payer: 59 | Admitting: Ophthalmology

## 2019-11-04 ENCOUNTER — Ambulatory Visit: Payer: 59 | Admitting: Cardiology

## 2019-11-14 ENCOUNTER — Encounter: Payer: Self-pay | Admitting: Cardiology

## 2019-11-14 ENCOUNTER — Other Ambulatory Visit: Payer: Self-pay

## 2019-11-14 ENCOUNTER — Ambulatory Visit: Payer: 59 | Admitting: Cardiology

## 2019-11-14 ENCOUNTER — Encounter (INDEPENDENT_AMBULATORY_CARE_PROVIDER_SITE_OTHER): Payer: 59 | Admitting: Ophthalmology

## 2019-11-14 VITALS — BP 123/65 | HR 72 | Resp 17 | Ht 68.0 in | Wt 147.0 lb

## 2019-11-14 DIAGNOSIS — I350 Nonrheumatic aortic (valve) stenosis: Secondary | ICD-10-CM

## 2019-11-14 DIAGNOSIS — I351 Nonrheumatic aortic (valve) insufficiency: Secondary | ICD-10-CM

## 2019-11-14 DIAGNOSIS — I6522 Occlusion and stenosis of left carotid artery: Secondary | ICD-10-CM

## 2019-11-14 MED ORDER — PANTOPRAZOLE SODIUM 40 MG PO TBEC: 40.0000 mg | DELAYED_RELEASE_TABLET | Freq: Every day | ORAL | 3 refills | Status: DC

## 2019-11-14 NOTE — Progress Notes (Signed)
Patient referred by [place] Canavan, PA-C for aortic insufficiency  Subjective:   [place], male    DOB: 02/03/1966, 54 y.o.   MRN: [id number]   Chief Complaint  Patient presents with  . Aortic Stenosis    insufficiency  . Follow-up  . Results    echo     HPI  54 year old male with hypertension, hyperlipidemia, mod AS, mod AI, Lt IC occlusion, retinal artery occlusion, tobacco dependence  Echocardiogram in 10/2019 showed normal EF, grade 1 diastolic dysfunction, moderate aortic stenosis, moderate aortic regurgitation, moderate mitral regurgitation.  There is no significant change compared to previous echocardiogram in May 2020.  Patient recently lost his mother, who lived in [place].  He was unable to visit her due to travel restrictions.  He has been under stress with his wife as well as his mother's illness and subsequent passing.  He has started smoking again, smokes 5 to 8 cigarettes a day.  He denies any exertional chest pain or dyspnea.  He reports intermittent heartburn sensation ever since starting Plavix in early 2021.  Initial consultation HPI 05/2019: Mild aortic insufficiency, moderate aortic stenosis was noted on outside echocardiogram in May 2020.  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 His major issue over the last few years has been left eye. He was told to have problems with his retina, as well as cataract. He has undergone several surgeries, and has had minimal improvement in his vision.   Patient is originally from [place], has been living in [place] since 1995 with his wife, works at [place]. He works out regularly at [place], doing aerobic as well as Runner, broadcasting/film/video. He denies chest pain, shortness of breath, palpitations, leg edema, orthopnea, PND, TIA/syncope.  His lipid panel has improved remarkably on atorvastatin.    Current Outpatient Medications on  File Prior to Visit  Medication Sig Dispense Refill  . atropine 1 % ophthalmic solution Place 1 drop into the left eye daily.    . brimonidine (ALPHAGAN) 0.15 % ophthalmic solution Place 1 drop into the left eye 2 (two) times daily.    . clopidogrel (PLAVIX) 75 MG tablet Take 1 tablet (75 mg total) by mouth daily. 30 tablet 11  . HYDROcodone-acetaminophen (NORCO) 5-325 MG tablet Take 1 tablet by mouth 2 (two) times daily as needed (for pain/headache). (Patient not taking: Reported on 08/22/2019) 12 tablet 0  . latanoprost (XALATAN) 0.005 % ophthalmic solution Place 1 drop into the left eye at bedtime.    . nicotine polacrilex (NICORETTE) 2 MG gum Take 1 each (2 mg total) by mouth as needed for smoking cessation. 60 tablet 1  . Omega-3 Fatty Acids (FISH OIL) 1200 MG CAPS Take by mouth daily.    . rosuvastatin (CRESTOR) 40 MG tablet TAKE 1 TABLET BY MOUTH EVERY DAY 30 tablet 5  . tamsulosin (FLOMAX) 0.4 MG CAPS capsule TAKE 1 CAPSULE BY MOUTH EVERY DAY 90 capsule 3  . Timolol Maleate 0.5 % (DAILY) SOLN Place 1 drop into the right eye every morning.    Marland Kitchen TIMOPTIC 0.5 % ophthalmic solution Place 1 drop into the left eye daily.     No current facility-administered medications on file prior to visit.    Cardiovascular and other pertinent studies:  Echocardiogram 10/16/2019:  Normal LV systolic function with visual EF 50-55%. Mild concentric  hypertrophy of the left ventricle. Normal global wall motion. Left  ventricle cavity is normal in  size. Doppler evidence of grade I (impaired)  diastolic dysfunction, normal LAP.  Trileaflet aortic valve with moderate calcification. Moderate aortic  stenosis. Aortic valve mean gradient of 15 mmHg, Vmax of 2.6 m/s.  Calculated aortic valve area by continuity equation is 1.2 cm. Moderate  (Grade II) aortic regurgitation.  Mild mitral valve leaflet calcification. Mildly restricted mitral valve  leaflets. Moderate (Grade II) mitral regurgitation.  Normal  right atrial pressure.  No significant change compared to previous study on 09/24/2018.  EKG 05/15/2019: Sinus rhythm 78 bpm. Normal EKG.    Vascular US 04/09/2019:  Right Carotid: Velocities in the right ICA are consistent with a 40-59% stenosis. Left Carotid: Evidence consistent with a total occlusion of the left ICA. Vertebrals:  Bilateral vertebral arteries demonstrate antegrade flow.  Subclavians: Right subclavian artery flow was turbulent. Normal flow hemodynamics were seen in the left subclavian artery.   Recent labs: 08/22/2019: Glucose 94, BUN/Cr 9/0.69. EGFR normal. Na/K 142/4.3. Rest of the CMP normal H/H 15/43. MCV 95. Platelets 241 Chol 111, TG 54, HDL 46, LDL 53 Results for [place], [place] (MRN [id number]) as of 11/14/2019 10:39  Ref. Range 08/22/2019 08:47  TSH Latest Ref Range: 0.450 - 4.500 uIU/mL 0.376 (L)  Triiodothyronine (T3) Latest Ref Range: 71 - 180 ng/dL 127  T4,Free(Direct) Latest Ref Range: 0.82 - 1.77 ng/dL 1.43    03/13/2019: Glucose 92, BUN/Cr 11/0.84. EGFR 99. Na/K 143/4.8. Rest of the CMP normal H/H 15/45. MCV 94. Platelets 220 HbA1C 5.8% Chol 111, TG 54, HDL 46, LDL 53 TSH 0.39 (low) Free T4 1.5 normal   Review of Systems  Eyes: Positive for vision loss in left eye.  Cardiovascular: Negative for chest pain, dyspnea on exertion, leg swelling, palpitations and syncope.          Vitals:   11/14/19 1436  BP: 123/65  Pulse: 72  Resp: 17  SpO2: 96%     Body mass index is 22.35 kg/m. Filed Weights   11/14/19 1436  Weight: 147 lb (66.7 kg)     Objective:   Physical Exam Vitals and nursing note reviewed.  Constitutional:      Appearance: He is well-developed.  Neck:     Vascular: No JVD.  Cardiovascular:     Rate and Rhythm: Normal rate and regular rhythm.     Pulses: Intact distal pulses.          Carotid pulses are on the right side with bruit and on the left side with bruit.    Heart sounds: Murmur heard.  Harsh  midsystolic murmur is present with a grade of 2/6 at the upper right sternal border radiating to the neck. Left bruit likely represents conducted aortic murmur    Pulmonary:     Effort: Pulmonary effort is normal.     Breath sounds: Normal breath sounds. No wheezing or rales.         Assessment & Recommendations:   54 year old male with hypertension, hyperlipidemia, peripheral artery disease, tobacco dependence, moderate aortic stenosis, mild aortic valve insufficiency.  Moderate AS/Mild AI: Stable, clinically asymptomatic. Repeat echocardiogram in 1 year  Hyperlipidemia: Remarkable improvement on rosuvastatin 40 mg daily. Continue the same.   PAD: Lt IC occlusion, Rt IC moderate stenosis. Continue Plavix, statin.   Added pantoprazole given his gastric irritation symptoms.    Tobacco dependence: Continue Chantix Counseled regarding tobacco cessation.  Follow-up in 1 year after repeat echocardiogram  Nigel Mormon, MD [facility] Pager: [phone] Office: [phone]

## 2019-11-18 ENCOUNTER — Encounter (INDEPENDENT_AMBULATORY_CARE_PROVIDER_SITE_OTHER): Payer: 59 | Admitting: Ophthalmology

## 2019-11-18 ENCOUNTER — Other Ambulatory Visit: Payer: Self-pay

## 2019-11-25 ENCOUNTER — Other Ambulatory Visit: Payer: Self-pay

## 2019-11-25 ENCOUNTER — Ambulatory Visit (INDEPENDENT_AMBULATORY_CARE_PROVIDER_SITE_OTHER): Payer: 59 | Admitting: Ophthalmology

## 2019-11-25 ENCOUNTER — Encounter (INDEPENDENT_AMBULATORY_CARE_PROVIDER_SITE_OTHER): Payer: 59 | Admitting: Ophthalmology

## 2019-11-25 ENCOUNTER — Encounter (INDEPENDENT_AMBULATORY_CARE_PROVIDER_SITE_OTHER): Payer: Self-pay | Admitting: Ophthalmology

## 2019-11-25 DIAGNOSIS — H211X2 Other vascular disorders of iris and ciliary body, left eye: Secondary | ICD-10-CM | POA: Diagnosis not present

## 2019-11-25 DIAGNOSIS — H348121 Central retinal vein occlusion, left eye, with retinal neovascularization: Secondary | ICD-10-CM | POA: Diagnosis not present

## 2019-11-25 MED ORDER — BEVACIZUMAB CHEMO INJECTION 1.25MG/0.05ML SYRINGE FOR KALEIDOSCOPE
1.2500 mg | INTRAVITREAL | Status: AC | PRN
  Administered 2019-11-25: 1.25 mg via INTRAVITREAL

## 2019-11-25 NOTE — Assessment & Plan Note (Signed)
This condition is better controlled on intravitreal Avastin, currently at 6-week interval.  Residual neo peripupillary particularly temporal

## 2019-11-25 NOTE — Progress Notes (Signed)
11/25/2019     CHIEF COMPLAINT Patient presents for Retina Follow Up   HISTORY OF PRESENT ILLNESS: Terry Gross is a 54 y.o. male who presents to the clinic today for:   HPI    Retina Follow Up    Patient presents with  Diabetic Retinopathy.  In left eye.  This started 5 weeks ago.  Duration of 5 weeks.  Since onset it is stable.          Comments    5 week f/u dilated exam, OCT(MAC) and possible Avastin OS today.  Pt denies noticeable changes to [place] OU since last visit. Pt denies ocular pain, flashes of light, or floaters OU.  LBS- does not check.        Last edited by Melburn Popper, COA on 11/25/2019  2:19 PM. (History)      Referring physician: Carlena Hurl, PA-C [place],  [place] [address]  HISTORICAL INFORMATION:   Selected notes from the MEDICAL RECORD NUMBER    Lab Results  Component Value Date   HGBA1C 5.8 (H) 03/13/2019     CURRENT MEDICATIONS: Current Outpatient Medications (Ophthalmic Drugs)  Medication Sig  . brimonidine (ALPHAGAN) 0.15 % ophthalmic solution Place 1 drop into the left eye 2 (two) times daily.  Marland Kitchen latanoprost (XALATAN) 0.005 % ophthalmic solution Place 1 drop into the left eye at bedtime.  . Timolol Maleate 0.5 % (DAILY) SOLN Place 1 drop into the right eye every morning.  Marland Kitchen TIMOPTIC 0.5 % ophthalmic solution Place 1 drop into the left eye daily.   No current facility-administered medications for this visit. (Ophthalmic Drugs)   Current Outpatient Medications (Other)  Medication Sig  . clopidogrel (PLAVIX) 75 MG tablet Take 1 tablet (75 mg total) by mouth daily.  . nicotine polacrilex (NICORETTE) 2 MG gum Take 1 each (2 mg total) by mouth as needed for smoking cessation. (Patient not taking: Reported on 11/14/2019)  . Omega-3 Fatty Acids (FISH OIL) 1200 MG CAPS Take by mouth daily.  . pantoprazole (PROTONIX) 40 MG tablet Take 1 tablet (40 mg total) by mouth daily.  . rosuvastatin (CRESTOR) 40 MG tablet  TAKE 1 TABLET BY MOUTH EVERY DAY  . tamsulosin (FLOMAX) 0.4 MG CAPS capsule TAKE 1 CAPSULE BY MOUTH EVERY DAY   No current facility-administered medications for this visit. (Other)      REVIEW OF SYSTEMS:    ALLERGIES No Known Allergies  PAST MEDICAL HISTORY Past Medical History:  Diagnosis Date  . Aortic valve insufficiency 12/15/2014   Moderate by ECHO 12/15/14 normal LV function   . Back pain   . Carotid artery occlusion   . Cataract   . Hyperlipidemia   . Hypertension   . Impaired fasting blood sugar   . Loss of balance   . Peripheral vascular disease ([place])   . Smoker    Past Surgical History:  Procedure Laterality Date  . CATARACT EXTRACTION      FAMILY HISTORY Family History  Problem Relation Age of Onset  . CVA Mother   . Other Mother        brain tumor  . Other Father        complications from prostate sugery  . Cancer Father        prostate  . Thyroid disease Neg Hx   . Heart disease Neg Hx   . Diabetes Neg Hx   . Kidney disease Neg Hx   . Hypertension Neg Hx   .  Hyperlipidemia Neg Hx     SOCIAL HISTORY Social History   Tobacco Use  . Smoking status: Current Every Day Smoker    Packs/day: 0.25    Years: 32.00    Pack years: 8.00    Types: Cigarettes  . Smokeless tobacco: Never Used  Vaping Use  . Vaping Use: Never used  Substance Use Topics  . Alcohol use: No  . Drug use: No         OPHTHALMIC EXAM:  Base Eye Exam    Visual Acuity (ETDRS)      Right Left   Dist [place] 20/20 -2 CF at 1'   Dist ph [place]  NI       Tonometry (Tonopen, 2:25 PM)      Right Left   Pressure 18 19       Pupils      Pupils Dark Light Shape React APD   Right PERRL 4 3 Round Minimal None   Left PERRL 6 6 Round Minimal None       Visual Fields (Counting fingers)      Left Right     Full   Restrictions Total superior temporal, inferior temporal deficiencies        Extraocular Movement      Right Left    Full Full       Neuro/Psych    Oriented  x3: Yes   Mood/Affect: Normal       Dilation    Left eye: 1.0% Mydriacyl, 2.5% Phenylephrine @ 2:25 PM        Slit Lamp and Fundus Exam    External Exam      Right Left   External Normal Normal       Slit Lamp Exam      Right Left   Lids/Lashes Normal Normal   Conjunctiva/Sclera White and quiet White and quiet   Cornea Clear Clear   Anterior Chamber Deep and quiet Deep and quiet   Iris Round and reactive Neovascularization, old and from ocular ischemia   Lens 2+ Nuclear sclerosis Posterior chamber intraocular lens   Anterior Vitreous Normal Normal       Fundus Exam      Right Left   Posterior Vitreous  Clear, vitrectomized   Disc  2+ Pallor   C/D Ratio  0.7   Macula  Microaneurysms   Vessels  Old central retinal artery occlusion with clear evidence of retinal nonperfusion peripherally.,  No neovascularization today   Periphery  Good PRP, 360,          IMAGING AND PROCEDURES  Imaging and Procedures for 11/25/19  OCT, Retina - OU - Both Eyes       Right Eye Quality was good. Scan locations included subfoveal. Central Foveal Thickness: 240. Progression has been stable.   Left Eye Quality was good. Scan locations included subfoveal. Central Foveal Thickness: 262. Progression has improved.   Notes Much less CME  OS       Intravitreal Injection, Pharmacologic Agent - OS - Left Eye       Time Out 11/25/2019. 3:31 PM. Confirmed correct patient, procedure, site, and patient consented.   Anesthesia Topical anesthesia was used. Anesthetic medications included Akten 3.5%.   Procedure Preparation included 10% betadine to eyelids, 5% betadine to ocular surface. A 30 gauge needle was used.   Injection:  1.25 mg Bevacizumab (AVASTIN) SOLN   NDC: [address]-2, Lot: [id number]   Route: Intravitreal, Site: Left Eye, Waste: 0 mg  Post-op Post  injection exam found visual acuity of at least counting fingers. The patient tolerated the procedure well. There were no  complications. The patient received written and verbal post procedure care education. Post injection medications were not given.                 ASSESSMENT/PLAN:  Rubeosis iridis of left eye This condition is better controlled on intravitreal Avastin, currently at 6-week interval.  Residual neo peripupillary particularly temporal      ICD-10-CM   1. 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 Central retinal vein occlusion, left eye, with retinal neovascularization  H34.8121   3. Rubeosis iridis of left eye  H21.1X2     1.  2.  3.  Ophthalmic Meds Ordered this visit:  Meds ordered this encounter  Medications  . Bevacizumab (AVASTIN) SOLN 1.25 mg       Return in about 7 weeks (around 01/13/2020) for dilate, OS, AVASTIN OCT.  There are no Patient Instructions on file for this visit.   Explained the diagnoses, plan, and follow up with the patient and they expressed understanding.  Patient expressed understanding of the importance of proper follow up care.   Clent Demark Helina Hullum M.D. Diseases & Surgery of the Retina and Vitreous [facility] 11/25/19     Abbreviations: M myopia (nearsighted); A astigmatism; H hyperopia (farsighted); P presbyopia; Mrx spectacle prescription;  CTL contact lenses; OD right eye; OS left eye; OU both eyes  XT exotropia; ET esotropia; PEK punctate epithelial keratitis; PEE punctate epithelial erosions; DES dry eye syndrome; MGD meibomian gland dysfunction; ATs artificial tears; PFAT's preservative free artificial tears; [place] nuclear sclerotic cataract; PSC posterior subcapsular cataract; ERM epi-retinal membrane; PVD posterior vitreous detachment; RD retinal detachment; DM diabetes mellitus; DR diabetic retinopathy; NPDR non-proliferative diabetic retinopathy; PDR  proliferative diabetic retinopathy; CSME clinically significant macular edema; DME diabetic macular edema; dbh dot blot hemorrhages; CWS cotton wool spot; POAG primary open angle glaucoma; C/D cup-to-disc ratio; HVF humphrey visual field; GVF goldmann visual field; OCT optical coherence tomography; IOP intraocular pressure; BRVO Branch retinal vein occlusion; CRVO central retinal vein occlusion; CRAO central retinal artery occlusion; BRAO branch retinal artery occlusion; RT retinal tear; SB scleral buckle; PPV pars plana vitrectomy; VH Vitreous hemorrhage; PRP panretinal laser photocoagulation; IVK intravitreal kenalog; VMT vitreomacular traction; MH Macular hole;  NVD neovascularization of the disc; NVE neovascularization elsewhere; AREDS age related eye disease study; ARMD age related macular degeneration; POAG primary open angle glaucoma; EBMD epithelial/anterior basement membrane dystrophy; ACIOL anterior chamber intraocular lens; IOL intraocular lens; PCIOL posterior chamber intraocular lens; Phaco/IOL phacoemulsification with intraocular lens placement; [place] photorefractive keratectomy; LASIK laser assisted in situ keratomileusis; HTN hypertension; DM diabetes mellitus; COPD chronic obstructive pulmonary disease

## 2020-01-14 ENCOUNTER — Encounter (INDEPENDENT_AMBULATORY_CARE_PROVIDER_SITE_OTHER): Payer: 59 | Admitting: Ophthalmology

## 2020-01-14 LAB — HM DIABETES EYE EXAM

## 2020-01-20 ENCOUNTER — Encounter (INDEPENDENT_AMBULATORY_CARE_PROVIDER_SITE_OTHER): Payer: 59 | Admitting: Ophthalmology

## 2020-01-27 ENCOUNTER — Other Ambulatory Visit (INDEPENDENT_AMBULATORY_CARE_PROVIDER_SITE_OTHER): Payer: Self-pay | Admitting: Ophthalmology

## 2020-01-27 ENCOUNTER — Encounter (INDEPENDENT_AMBULATORY_CARE_PROVIDER_SITE_OTHER): Payer: 59 | Admitting: Ophthalmology

## 2020-02-03 ENCOUNTER — Encounter (INDEPENDENT_AMBULATORY_CARE_PROVIDER_SITE_OTHER): Payer: 59 | Admitting: Ophthalmology

## 2020-02-06 ENCOUNTER — Encounter (INDEPENDENT_AMBULATORY_CARE_PROVIDER_SITE_OTHER): Payer: 59 | Admitting: Ophthalmology

## 2020-02-10 ENCOUNTER — Other Ambulatory Visit: Payer: Self-pay

## 2020-02-10 ENCOUNTER — Encounter (INDEPENDENT_AMBULATORY_CARE_PROVIDER_SITE_OTHER): Payer: Self-pay | Admitting: Ophthalmology

## 2020-02-10 ENCOUNTER — Ambulatory Visit (INDEPENDENT_AMBULATORY_CARE_PROVIDER_SITE_OTHER): Payer: 59 | Admitting: Ophthalmology

## 2020-02-10 DIAGNOSIS — H3412 Central retinal artery occlusion, left eye: Secondary | ICD-10-CM

## 2020-02-10 DIAGNOSIS — H3522 Other non-diabetic proliferative retinopathy, left eye: Secondary | ICD-10-CM | POA: Diagnosis not present

## 2020-02-10 DIAGNOSIS — H35372 Puckering of macula, left eye: Secondary | ICD-10-CM

## 2020-02-10 DIAGNOSIS — H2511 Age-related nuclear cataract, right eye: Secondary | ICD-10-CM | POA: Diagnosis not present

## 2020-02-10 DIAGNOSIS — H348121 Central retinal vein occlusion, left eye, with retinal neovascularization: Secondary | ICD-10-CM | POA: Diagnosis not present

## 2020-02-10 MED ORDER — BEVACIZUMAB CHEMO INJECTION 1.25MG/0.05ML SYRINGE FOR KALEIDOSCOPE
1.2500 mg | INTRAVITREAL | Status: AC | PRN
  Administered 2020-02-10: 1.25 mg via INTRAVITREAL

## 2020-02-10 NOTE — Assessment & Plan Note (Signed)
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

## 2020-02-10 NOTE — Assessment & Plan Note (Signed)
This condition controlled, due to ocular ischemia

## 2020-02-10 NOTE — Assessment & Plan Note (Signed)
Follow-up with Dr. Elmer Picker and [facility] as scheduled

## 2020-02-10 NOTE — Progress Notes (Signed)
02/10/2020     CHIEF COMPLAINT Patient presents for Retina Follow Up   HISTORY OF PRESENT ILLNESS: Terry Gross is a 54 y.o. male who presents to the clinic today for:   HPI    Retina Follow Up    Patient presents with  CRVO/BRVO.  In left eye.  Severity is moderate.  Duration of 11 weeks.  Since onset it is stable.  I, the attending physician,  performed the HPI with the patient and updated documentation appropriately.          Comments    11 Week CRVO f\u OS. Possible Avastin OS. OCT  Pt states vision is stable. Denies any new complaints.       Last edited by Elyse Jarvis on 02/10/2020  3:12 PM. (History)      Referring physician: Jac Canavan, PA-C [address] [place],  [place] [address]  HISTORICAL INFORMATION:   Selected notes from the MEDICAL RECORD NUMBER    Lab Results  Component Value Date   HGBA1C 5.8 (H) 03/13/2019     CURRENT MEDICATIONS: Current Outpatient Medications (Ophthalmic Drugs)  Medication Sig  . brimonidine (ALPHAGAN) 0.15 % ophthalmic solution INSTILL 1 DROP INTO LEFT EYE TWICE A DAY  . latanoprost (XALATAN) 0.005 % ophthalmic solution Place 1 drop into the left eye at bedtime.  . Timolol Maleate 0.5 % (DAILY) SOLN Place 1 drop into the right eye every morning.  Marland Kitchen TIMOPTIC 0.5 % ophthalmic solution Place 1 drop into the left eye daily.   No current facility-administered medications for this visit. (Ophthalmic Drugs)   Current Outpatient Medications (Other)  Medication Sig  . clopidogrel (PLAVIX) 75 MG tablet Take 1 tablet (75 mg total) by mouth daily.  . nicotine polacrilex (NICORETTE) 2 MG gum Take 1 each (2 mg total) by mouth as needed for smoking cessation. (Patient not taking: Reported on 11/14/2019)  . Omega-3 Fatty Acids (FISH OIL) 1200 MG CAPS Take by mouth daily.  . pantoprazole (PROTONIX) 40 MG tablet Take 1 tablet (40 mg total) by mouth daily.  . rosuvastatin (CRESTOR) 40 MG tablet TAKE 1 TABLET BY MOUTH  EVERY DAY  . tamsulosin (FLOMAX) 0.4 MG CAPS capsule TAKE 1 CAPSULE BY MOUTH EVERY DAY   No current facility-administered medications for this visit. (Other)      REVIEW OF SYSTEMS:    ALLERGIES No Known Allergies  PAST MEDICAL HISTORY Past Medical History:  Diagnosis Date  . Aortic valve insufficiency 12/15/2014   Moderate by ECHO 12/15/14 normal LV function   . Back pain   . Carotid artery occlusion   . Cataract   . Hyperlipidemia   . Hypertension   . Impaired fasting blood sugar   . Loss of balance   . Peripheral vascular disease ([facility])   . Smoker    Past Surgical History:  Procedure Laterality Date  . CATARACT EXTRACTION      FAMILY HISTORY Family History  Problem Relation Age of Onset  . CVA Mother   . Other Mother        brain tumor  . Other Father        complications from prostate sugery  . Cancer Father        prostate  . Thyroid disease Neg Hx   . Heart disease Neg Hx   . Diabetes Neg Hx   . Kidney disease Neg Hx   . Hypertension Neg Hx   . Hyperlipidemia Neg Hx     SOCIAL HISTORY  Social History   Tobacco Use  . Smoking status: Current Every Day Smoker    Packs/day: 0.25    Years: 32.00    Pack years: 8.00    Types: Cigarettes  . Smokeless tobacco: Never Used  Vaping Use  . Vaping Use: Never used  Substance Use Topics  . Alcohol use: No  . Drug use: No         OPHTHALMIC EXAM: Base Eye Exam    Visual Acuity (Snellen - Linear)      Right Left   Dist [place] 20/25 + CF @ 1'   Dist ph [place]  NI       Tonometry (Tonopen, 3:16 PM)      Right Left   Pressure 12 12       Pupils      Pupils Dark Light Shape React APD   Right PERRL 4 3 Round Sluggish None   Left PERRL 6 6 Round Minimal None       Visual Fields (Counting fingers)      Left Right     Full   Restrictions Partial outer superior temporal, inferior temporal, superior nasal, inferior nasal deficiencies        Neuro/Psych    Oriented x3: Yes   Mood/Affect: Normal         Dilation    Left eye: 1.0% Mydriacyl, 2.5% Phenylephrine @ 3:16 PM        Slit Lamp and Fundus Exam    External Exam      Right Left   External Normal Normal       Slit Lamp Exam      Right Left   Lids/Lashes Normal Normal   Conjunctiva/Sclera White and quiet White and quiet   Cornea Clear Clear   Anterior Chamber Deep and quiet Deep and quiet   Iris Round and reactive Neovascularization, old and from ocular ischemia   Lens 2+ Nuclear sclerosis Posterior chamber intraocular lens   Anterior Vitreous Normal Normal       Fundus Exam      Right Left   Posterior Vitreous  Clear, vitrectomized   Disc  2+ Pallor   C/D Ratio  0.7   Macula  Microaneurysms   Vessels  Old central retinal artery occlusion with clear evidence of retinal nonperfusion peripherally.,  No neovascularization today   Periphery  Good PRP, 360,          IMAGING AND PROCEDURES  Imaging and Procedures for 02/10/20  OCT, Retina - OU - Both Eyes       Right Eye Quality was good. Scan locations included subfoveal. Central Foveal Thickness: 235. Progression has been stable. Findings include normal observations.   Left Eye Quality was good. Scan locations included subfoveal. Central Foveal Thickness: 252. Progression has been stable. Findings include abnormal foveal contour.   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 4:45 PM. Confirmed correct patient, procedure, site, and patient consented.   Anesthesia Topical anesthesia was used. Anesthetic medications included Akten 3.5%.   Procedure Preparation included 10% betadine to eyelids, 5% betadine to ocular surface. A 30 gauge needle was used.   Injection:  1.25 mg Bevacizumab (AVASTIN) SOLN   NDC: 70360-001-02, Lot: [id number]   Route: Intravitreal, Site: Left Eye, Waste: 0 mg  Post-op Post injection exam  found visual acuity  of at least counting fingers. The patient tolerated the procedure well. There were no complications. The patient received written and verbal post procedure care education. Post injection medications were not given.                 ASSESSMENT/PLAN:  Left epiretinal membrane This condition resolved from prior surgical intervention  Nondiabetic proliferative retinopathy, left This condition controlled, due to ocular ischemia  Nuclear sclerotic cataract of right eye Follow-up with Dr. 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 Left epiretinal membrane  H35.372   3. Nondiabetic proliferative retinopathy, left  H35.22   4. Nuclear sclerotic cataract of right eye  H25.11   5. Central retinal artery occlusion of left eye  H34.12     1.  2.  3.  Ophthalmic Meds Ordered this visit:  Meds ordered this encounter  Medications  . Bevacizumab (AVASTIN) SOLN 1.25 mg       Return in about 3 months (around 05/12/2020) for dilate, OS, AVASTIN OCT.  There are no Patient Instructions on file for this visit.   Explained the diagnoses, plan, and follow up with the patient and they expressed understanding.  Patient expressed understanding of the importance of proper follow up care.   Alford Highland Chancey Gross M.D. Diseases & Surgery of the Retina and Vitreous [facility] 02/10/20     Abbreviations: M myopia (nearsighted); A astigmatism; H hyperopia (farsighted); P presbyopia; Mrx spectacle prescription;  CTL contact lenses; OD right eye; OS left eye; OU both eyes  XT exotropia; ET esotropia; PEK punctate epithelial  keratitis; PEE punctate epithelial erosions; DES dry eye syndrome; MGD meibomian gland dysfunction; ATs artificial tears; PFAT's preservative free artificial tears; NSC nuclear sclerotic cataract; PSC posterior subcapsular cataract; ERM epi-retinal membrane; PVD posterior vitreous detachment; RD retinal detachment; DM diabetes mellitus; DR diabetic retinopathy; NPDR non-proliferative diabetic retinopathy; PDR proliferative diabetic retinopathy; CSME clinically significant macular edema; DME diabetic macular edema; dbh dot blot hemorrhages; CWS cotton wool spot; POAG primary open angle glaucoma; C/D cup-to-disc ratio; HVF humphrey visual field; GVF goldmann visual field; OCT optical coherence tomography; IOP intraocular pressure; BRVO Branch retinal vein occlusion; CRVO central retinal vein occlusion; CRAO central retinal artery occlusion; BRAO branch retinal artery occlusion; RT retinal tear; SB scleral buckle; PPV pars plana vitrectomy; VH Vitreous hemorrhage; PRP panretinal laser photocoagulation; IVK intravitreal kenalog; VMT vitreomacular traction; MH Macular hole;  NVD neovascularization of the disc; NVE neovascularization elsewhere; AREDS age related eye disease study; ARMD age related macular degeneration; POAG primary open angle glaucoma; EBMD epithelial/anterior basement membrane dystrophy; ACIOL anterior chamber intraocular lens; IOL intraocular lens; PCIOL posterior chamber intraocular lens; Phaco/IOL phacoemulsification with intraocular lens placement; PRK photorefractive keratectomy; LASIK laser assisted in situ keratomileusis; HTN hypertension; DM diabetes mellitus; COPD chronic obstructive pulmonary disease

## 2020-02-10 NOTE — Assessment & Plan Note (Signed)
This condition resolved from prior surgical intervention

## 2020-02-10 NOTE — Assessment & Plan Note (Signed)
CME component of this is also resolved

## 2020-03-04 ENCOUNTER — Other Ambulatory Visit (INDEPENDENT_AMBULATORY_CARE_PROVIDER_SITE_OTHER): Payer: Self-pay | Admitting: Ophthalmology

## 2020-03-13 ENCOUNTER — Encounter: Payer: 59 | Admitting: Medical

## 2020-04-14 ENCOUNTER — Ambulatory Visit (HOSPITAL_COMMUNITY)
Admission: RE | Admit: 2020-04-14 | Discharge: 2020-04-14 | Disposition: A | Discharge status: Home / Self Care | Payer: 59 | Source: Ambulatory Visit | Attending: Vascular Surgery | Admitting: Vascular Surgery

## 2020-04-14 ENCOUNTER — Other Ambulatory Visit: Payer: Self-pay

## 2020-04-14 ENCOUNTER — Ambulatory Visit: Payer: 59

## 2020-04-14 DIAGNOSIS — I6523 Occlusion and stenosis of bilateral carotid arteries: Secondary | ICD-10-CM | POA: Diagnosis not present

## 2020-04-15 ENCOUNTER — Encounter: Payer: Self-pay | Admitting: Medical

## 2020-04-16 ENCOUNTER — Encounter: Payer: Self-pay | Admitting: Medical

## 2020-04-19 ENCOUNTER — Other Ambulatory Visit (INDEPENDENT_AMBULATORY_CARE_PROVIDER_SITE_OTHER): Payer: Self-pay | Admitting: Ophthalmology

## 2020-04-20 ENCOUNTER — Encounter: Payer: Self-pay | Admitting: Vascular Surgery

## 2020-04-20 ENCOUNTER — Other Ambulatory Visit: Payer: Self-pay

## 2020-04-20 ENCOUNTER — Ambulatory Visit (INDEPENDENT_AMBULATORY_CARE_PROVIDER_SITE_OTHER): Payer: 59 | Admitting: Vascular Surgery

## 2020-04-20 VITALS — BP 139/83 | HR 66 | Temp 89.2°F | Resp 16 | Ht 68.0 in | Wt 150.0 lb

## 2020-04-20 DIAGNOSIS — I6523 Occlusion and stenosis of bilateral carotid arteries: Secondary | ICD-10-CM | POA: Diagnosis not present

## 2020-04-20 NOTE — Progress Notes (Signed)
[facility]  Patient name: Terry Gross MRN: [id number] DOB: 11-Jul-1965 Sex: male  REASON FOR VISIT: Continued follow-up carotid disease  HPI: Terry Gross is a 54 y.o. male here for continued follow-up of carotid disease.  He denies any new neurologic deficits.  He does have known left internal carotid artery occlusion.  He suffered central retinal artery occlusion possibly at the time of his internal carotid occlusion.  He has no other focal deficits.  He is remained stable from cardiac standpoint.  Past Medical History:  Diagnosis Date  . Aortic valve insufficiency 12/15/2014   Moderate by ECHO 12/15/14 normal LV function   . Back pain   . Carotid artery occlusion   . Cataract   . Hyperlipidemia   . Hypertension   . Impaired fasting blood sugar   . Loss of balance   . Peripheral vascular disease ([facility])   . Smoker     Family History  Problem Relation Age of Onset  . CVA Mother   . Other Mother        brain tumor  . Other Father        complications from prostate sugery  . Cancer Father        prostate  . Thyroid disease Neg Hx   . Heart disease Neg Hx   . Diabetes Neg Hx   . Kidney disease Neg Hx   . Hypertension Neg Hx   . Hyperlipidemia Neg Hx     SOCIAL HISTORY: Social History   Tobacco Use  . Smoking status: Current Every Day Smoker    Packs/day: 0.25    Years: 32.00    Pack years: 8.00    Types: Cigarettes  . Smokeless tobacco: Never Used  Substance Use Topics  . Alcohol use: No    No Known Allergies  Current Outpatient Medications  Medication Sig Dispense Refill  . brimonidine (ALPHAGAN) 0.15 % ophthalmic solution INSTILL 1 DROP INTO LEFT EYE TWICE A DAY 5 mL 3  . clopidogrel (PLAVIX) 75 MG tablet Take 1 tablet (75 mg total) by mouth daily. 30 tablet 11  . latanoprost (XALATAN) 0.005 % ophthalmic solution INSTILL 1 DROP IN LEFT EYE NIGHTLY 2.5 mL 3  . nicotine polacrilex  (NICORETTE) 2 MG gum Take 1 each (2 mg total) by mouth as needed for smoking cessation. 60 tablet 1  . Omega-3 Fatty Acids (FISH OIL) 1200 MG CAPS Take by mouth daily.    . pantoprazole (PROTONIX) 40 MG tablet Take 1 tablet (40 mg total) by mouth daily. 90 tablet 3  . rosuvastatin (CRESTOR) 40 MG tablet TAKE 1 TABLET BY MOUTH EVERY DAY 30 tablet 5  . tamsulosin (FLOMAX) 0.4 MG CAPS capsule TAKE 1 CAPSULE BY MOUTH EVERY DAY 90 capsule 3  . Timolol Maleate 0.5 % (DAILY) SOLN INSTILL 1 DROP INTO LEFT EYE EVERY MORNING AS DIRECTED 2.5 mL 3  . TIMOPTIC 0.5 % ophthalmic solution Place 1 drop into the left eye daily.     No current facility-administered medications for this visit.    REVIEW OF SYSTEMS:  [X]  denotes positive finding, [ ]  denotes negative finding Cardiac  Comments:  Chest pain or chest pressure:    Shortness of breath upon exertion:    Short of breath when lying flat:    Irregular heart rhythm:        Vascular    Pain in calf, thigh, or hip brought on by ambulation:    Pain in feet  at night that wakes you up from your sleep:     Blood clot in your veins:    Leg swelling:           PHYSICAL EXAM: Vitals:   04/20/20 0935  BP: 139/83  Pulse: 66  Resp: 16  Temp: (!) 89.2 F (31.8 C)  TempSrc: Other (Comment)  SpO2: 97%  Weight: 150 lb (68 kg)  Height: 5\' 8"  (1.727 m)    GENERAL: The patient is a well-nourished male, in no acute distress. The vital signs are documented above. CARDIOVASCULAR: Soft right carotid bruit and no left carotid bruit.  2+ radial pulses bilaterally PULMONARY: There is good air exchange  MUSCULOSKELETAL: There are no major deformities or cyanosis. NEUROLOGIC: No focal weakness or paresthesias are detected. SKIN: There are no ulcers or rashes noted. PSYCHIATRIC: The patient has a normal affect.  DATA:  Carotid duplex from 04/14/2020 was reviewed with the patient.  This shows no change in his 40 to 59% right internal carotid artery stenosis.   Confirms known left internal carotid artery occlusion  MEDICAL ISSUES: Stable carotid disease.  No progression of his asymptomatic right carotid stenosis.  I again reviewed symptoms of carotid disease with him and he knows to report immediately to the emergency room should this occur.  Otherwise we will see him again in 1 year with repeat carotid duplex    14/11/2019, MD [facility] Tel [phone]

## 2020-05-03 ENCOUNTER — Other Ambulatory Visit: Payer: Self-pay | Admitting: Medical

## 2020-05-04 NOTE — Telephone Encounter (Signed)
Is this okay to refill? 

## 2020-05-05 ENCOUNTER — Telehealth: Payer: Self-pay | Admitting: Medical

## 2020-05-05 NOTE — Telephone Encounter (Signed)
Lets make sure he is on the schedule for a fasting physical soon

## 2020-05-07 ENCOUNTER — Other Ambulatory Visit: Payer: Self-pay | Admitting: Medical

## 2020-05-07 ENCOUNTER — Encounter: Payer: 59 | Admitting: Medical

## 2020-05-07 NOTE — Telephone Encounter (Signed)
Pt is already schedule for one in January

## 2020-05-12 ENCOUNTER — Encounter (INDEPENDENT_AMBULATORY_CARE_PROVIDER_SITE_OTHER): Payer: 59 | Admitting: Ophthalmology

## 2020-05-13 ENCOUNTER — Encounter (INDEPENDENT_AMBULATORY_CARE_PROVIDER_SITE_OTHER): Payer: 59 | Admitting: Ophthalmology

## 2020-05-14 ENCOUNTER — Encounter (INDEPENDENT_AMBULATORY_CARE_PROVIDER_SITE_OTHER): Payer: 59 | Admitting: Ophthalmology

## 2020-05-19 ENCOUNTER — Ambulatory Visit (INDEPENDENT_AMBULATORY_CARE_PROVIDER_SITE_OTHER): Payer: 59 | Admitting: Ophthalmology

## 2020-05-19 ENCOUNTER — Other Ambulatory Visit: Payer: Self-pay

## 2020-05-19 ENCOUNTER — Other Ambulatory Visit: Payer: Self-pay | Admitting: Medical

## 2020-05-19 ENCOUNTER — Encounter (INDEPENDENT_AMBULATORY_CARE_PROVIDER_SITE_OTHER): Payer: Self-pay | Admitting: Ophthalmology

## 2020-05-19 DIAGNOSIS — H211X2 Other vascular disorders of iris and ciliary body, left eye: Secondary | ICD-10-CM

## 2020-05-19 DIAGNOSIS — H34812 Central retinal vein occlusion, left eye, with macular edema: Secondary | ICD-10-CM

## 2020-05-19 DIAGNOSIS — H348121 Central retinal vein occlusion, left eye, with retinal neovascularization: Secondary | ICD-10-CM | POA: Diagnosis not present

## 2020-05-19 DIAGNOSIS — H4312 Vitreous hemorrhage, left eye: Secondary | ICD-10-CM

## 2020-05-19 DIAGNOSIS — H2102 Hyphema, left eye: Secondary | ICD-10-CM

## 2020-05-19 MED ORDER — BEVACIZUMAB 2.5 MG/0.1ML IZ SOSY
2.5000 mg | PREFILLED_SYRINGE | INTRAVITREAL | Status: AC | PRN
  Administered 2020-05-19: 2.5 mg via INTRAVITREAL

## 2020-05-19 NOTE — Assessment & Plan Note (Addendum)
New active bleeding.

## 2020-05-19 NOTE — Assessment & Plan Note (Signed)
We will need to repeat intravitreal Avastin OS to calm the total ocular ischemia and preserve residual visual function

## 2020-05-19 NOTE — Assessment & Plan Note (Signed)
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

## 2020-05-19 NOTE — Progress Notes (Signed)
05/19/2020     CHIEF COMPLAINT Patient presents for Retina Follow Up (3 MO FU OS, POSS AVASTIN OS////Pt reports stable vision OU, no new F/F OU, no pain or pressure OU. )   HISTORY OF PRESENT ILLNESS: Terry Gross is a 55 y.o. male who presents to the clinic today for:   HPI    Retina Follow Up    Patient presents with  Other.  In left eye.  This started 3 months ago.  Duration of 3 months.  Since onset it is stable. Additional comments: 3 MO FU OS, POSS AVASTIN OS    Pt reports stable vision OU, no new F/F OU, no pain or pressure OU.        Last edited by Varney Biles D on 05/19/2020  8:41 AM. (History)      Referring physician: Jac Canavan, PA-C [address] [place],  [place] [address]  HISTORICAL INFORMATION:   Selected notes from the MEDICAL RECORD NUMBER    Lab Results  Component Value Date   HGBA1C 5.8 (H) 03/13/2019     CURRENT MEDICATIONS: Current Outpatient Medications (Ophthalmic Drugs)  Medication Sig  . brimonidine (ALPHAGAN) 0.15 % ophthalmic solution INSTILL 1 DROP INTO LEFT EYE TWICE A DAY  . latanoprost (XALATAN) 0.005 % ophthalmic solution INSTILL 1 DROP IN LEFT EYE NIGHTLY  . Timolol Maleate 0.5 % (DAILY) SOLN INSTILL 1 DROP INTO LEFT EYE EVERY MORNING AS DIRECTED  . TIMOPTIC 0.5 % ophthalmic solution Place 1 drop into the left eye daily. (Patient not taking: Reported on 05/19/2020)   No current facility-administered medications for this visit. (Ophthalmic Drugs)   Current Outpatient Medications (Other)  Medication Sig  . clopidogrel (PLAVIX) 75 MG tablet Take 1 tablet (75 mg total) by mouth daily.  . nicotine polacrilex (NICORETTE) 2 MG gum Take 1 each (2 mg total) by mouth as needed for smoking cessation.  . Omega-3 Fatty Acids (FISH OIL) 1200 MG CAPS Take by mouth daily.  . pantoprazole (PROTONIX) 40 MG tablet Take 1 tablet (40 mg total) by mouth daily.  . rosuvastatin (CRESTOR) 40 MG tablet TAKE 1 TABLET BY MOUTH EVERY DAY   . tamsulosin (FLOMAX) 0.4 MG CAPS capsule TAKE 1 CAPSULE BY MOUTH EVERY DAY   No current facility-administered medications for this visit. (Other)      REVIEW OF SYSTEMS:    ALLERGIES No Known Allergies  PAST MEDICAL HISTORY Past Medical History:  Diagnosis Date  . Aortic valve insufficiency 12/15/2014   Moderate by ECHO 12/15/14 normal LV function   . Back pain   . Carotid artery occlusion   . Cataract   . Hyperlipidemia   . Hypertension   . Impaired fasting blood sugar   . Loss of balance   . Peripheral vascular disease (HCC)   . Smoker    Past Surgical History:  Procedure Laterality Date  . CATARACT EXTRACTION      FAMILY HISTORY Family History  Problem Relation Age of Onset  . CVA Mother   . Other Mother        brain tumor  . Other Father        complications from prostate sugery  . Cancer Father        prostate  . Thyroid disease Neg Hx   . Heart disease Neg Hx   . Diabetes Neg Hx   . Kidney disease Neg Hx   . Hypertension Neg Hx   . Hyperlipidemia Neg Hx     SOCIAL  HISTORY Social History   Tobacco Use  . Smoking status: Current Every Day Smoker    Packs/day: 0.25    Years: 32.00    Pack years: 8.00    Types: Cigarettes  . Smokeless tobacco: Never Used  Vaping Use  . Vaping Use: Never used  Substance Use Topics  . Alcohol use: No  . Drug use: No         OPHTHALMIC EXAM:  Base Eye Exam    Visual Acuity (ETDRS)      Right Left   Dist [place] 20/25 -2 CF at 1'       Tonometry (Tonopen, 8:47 AM)      Right Left   Pressure 21 25       Tonometry #2 (Tonopen, 8:47 AM)      Right Left   Pressure  23       Pupils      Pupils Dark Light Shape React APD   Right PERRL 4 3 Round Sluggish None   Left PERRL 6 5 Round Sluggish None       Visual Fields      Left Right     Full   Restrictions Partial outer superior temporal, inferior temporal, superior nasal, inferior nasal deficiencies        Neuro/Psych    Oriented x3: Yes    Mood/Affect: Normal       Dilation    Left eye: 1.0% Mydriacyl, 2.5% Phenylephrine @ 8:47 AM        Slit Lamp and Fundus Exam    External Exam      Right Left   External Normal Normal       Slit Lamp Exam      Right Left   Lids/Lashes Normal Normal   Conjunctiva/Sclera White and quiet White and quiet   Cornea Clear Clear   Anterior Chamber Deep and quiet Deep , 3+ Cell rbc, 1 mm layered hyphema inferiorly   Iris Round and reactive Neovascularization, old and from ocular ischemia   Lens 2+ Nuclear sclerosis Posterior chamber intraocular lens   Anterior Vitreous Normal 2+ Cells, Vitreous hemorrhage       Fundus Exam      Right Left   Posterior Vitreous  Vitreous hemorrhage, 2+ Cells   Disc  2+ Pallor, headlight and a fog   C/D Ratio  0.7   Macula  Microaneurysms   Vessels  Old central retinal artery occlusion with clear evidence of retinal nonperfusion peripherally.,  No neovascularization today   Periphery  Good PRP, 360,          IMAGING AND PROCEDURES  Imaging and Procedures for 05/19/20  OCT, Retina - OU - Both Eyes       Right Eye Quality was good. Scan locations included subfoveal. Central Foveal Thickness: 240. Progression has been stable. Findings include normal foveal contour.   Left Eye Quality was poor. Scan locations included juxtafoveal. Progression has been stable. Findings include abnormal foveal contour, outer retinal atrophy, central retinal atrophy.   Notes OS with diffuse retinal atrophy but mostly medial opacity precludes adequate details, due to vitreous hemorrhage and Hyphema from anterior segment neovascularization       Intravitreal Injection, Pharmacologic Agent - OS - Left Eye       Time Out 05/19/2020. 9:22 AM. Confirmed correct patient, procedure, site, and patient consented.   Anesthesia Topical anesthesia was used. Anesthetic medications included Akten 3.5%.   Procedure Preparation included 10% betadine to eyelids, 5%  betadine to ocular surface. A 30 gauge needle was used.   Injection:  2.5 mg Bevacizumab (AVASTIN) 2.5mg /0.23mL SOSY   NDC: 86578-469-62, Lot: [id number]   Route: Intravitreal, Site: Left Eye  Post-op Post injection exam found visual acuity of at least counting fingers. The patient tolerated the procedure well. There were no complications. The patient received written and verbal post procedure care education. Post injection medications were not given.                 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 Central retinal vein occlusion with macular edema of left eye  H34.8120 Intravitreal Injection, Pharmacologic Agent - OS - Left Eye    bevacizumab (AVASTIN) SOSY 2.5 mg  2. Central retinal vein occlusion with neovascularization of left eye  H34.8121 OCT, Retina - OU - Both Eyes    Intravitreal Injection, Pharmacologic Agent - OS - Left Eye    bevacizumab (AVASTIN) SOSY 2.5 mg  3. Vitreous hemorrhage of left eye (HCC)  H43.12   4. Rubeosis iridis of left eye  H21.1X2   5. Central retinal vein occlusion, left eye, with macular edema  H34.8120   6. Hyphema, left eye  H21.02     1.  We will repeat injection intravitreal Avastin OS today to control ocular ischemic disease and residual central vein occlusion ischemia.  2.  She does note that over the last week his vision did seem to decline and become fuzzy.  3.  Patient  instructed to continue his topical medications left eye as previously instructed  Ophthalmic Meds Ordered this visit:  Meds ordered this encounter  Medications  . bevacizumab (AVASTIN) SOSY 2.5 mg       Return in about 10 weeks (around 07/28/2020) for OS, AVASTIN OCT.  There are no Patient Instructions on file for this visit.   Explained the diagnoses, plan, and follow up with the patient and they expressed understanding.  Patient expressed understanding of the importance of proper follow up care.   Alford Highland Jesselee Poth M.D. Diseases & Surgery of the Retina and Vitreous [facility] 05/19/20     Abbreviations: M myopia (nearsighted); A astigmatism; H hyperopia (farsighted); P presbyopia; Mrx spectacle prescription;  CTL contact lenses; OD right eye; OS left eye; OU both eyes  XT exotropia; ET esotropia; PEK punctate epithelial keratitis; PEE punctate epithelial erosions; DES dry eye syndrome; MGD meibomian gland dysfunction; ATs artificial tears; PFAT's preservative free artificial tears; NSC nuclear sclerotic cataract; PSC posterior subcapsular cataract; ERM epi-retinal membrane; PVD posterior vitreous detachment; RD retinal detachment; DM diabetes mellitus; DR diabetic retinopathy; NPDR non-proliferative diabetic retinopathy; PDR proliferative diabetic retinopathy; CSME clinically significant macular edema; DME diabetic macular edema; dbh dot blot hemorrhages; CWS cotton wool spot; POAG primary open angle glaucoma; C/D cup-to-disc ratio; HVF humphrey visual field; GVF goldmann visual field; OCT optical coherence tomography; IOP intraocular pressure; BRVO Branch retinal vein occlusion; CRVO central retinal vein occlusion; CRAO central retinal artery occlusion;  BRAO branch retinal artery occlusion; RT retinal tear; SB scleral buckle; PPV pars plana vitrectomy; VH Vitreous hemorrhage; PRP panretinal laser photocoagulation; IVK intravitreal kenalog; VMT vitreomacular traction; MH  Macular hole;  NVD neovascularization of the disc; NVE neovascularization elsewhere; AREDS age related eye disease study; ARMD age related macular degeneration; POAG primary open angle glaucoma; EBMD epithelial/anterior basement membrane dystrophy; ACIOL anterior chamber intraocular lens; IOL intraocular lens; PCIOL posterior chamber intraocular lens; Phaco/IOL phacoemulsification with intraocular lens placement; PRK photorefractive keratectomy; LASIK laser assisted in situ keratomileusis; HTN hypertension; DM diabetes mellitus; COPD chronic obstructive pulmonary disease

## 2020-05-19 NOTE — Assessment & Plan Note (Signed)
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

## 2020-05-29 ENCOUNTER — Encounter: Payer: 59 | Admitting: Medical

## 2020-06-14 ENCOUNTER — Other Ambulatory Visit (INDEPENDENT_AMBULATORY_CARE_PROVIDER_SITE_OTHER): Payer: Self-pay | Admitting: Ophthalmology

## 2020-07-06 ENCOUNTER — Other Ambulatory Visit: Payer: Self-pay | Admitting: Medical

## 2020-07-07 HISTORY — PX: COLONOSCOPY: SHX174

## 2020-07-09 ENCOUNTER — Other Ambulatory Visit: Payer: Self-pay

## 2020-07-09 MED ORDER — CLOPIDOGREL BISULFATE 75 MG PO TABS: 75.0000 mg | ORAL_TABLET | Freq: Every day | ORAL | 0 refills | Status: DC

## 2020-07-22 ENCOUNTER — Ambulatory Visit (INDEPENDENT_AMBULATORY_CARE_PROVIDER_SITE_OTHER): Payer: 59 | Admitting: Medical

## 2020-07-22 ENCOUNTER — Other Ambulatory Visit: Payer: Self-pay

## 2020-07-22 ENCOUNTER — Encounter: Payer: Self-pay | Admitting: Medical

## 2020-07-22 VITALS — BP 138/88 | HR 64 | Ht 68.0 in | Wt 147.4 lb

## 2020-07-22 DIAGNOSIS — Z125 Encounter for screening for malignant neoplasm of prostate: Secondary | ICD-10-CM

## 2020-07-22 DIAGNOSIS — Z0000 Encounter for general adult medical examination without abnormal findings: Secondary | ICD-10-CM

## 2020-07-22 DIAGNOSIS — Z23 Encounter for immunization: Secondary | ICD-10-CM | POA: Diagnosis not present

## 2020-07-22 DIAGNOSIS — F17200 Nicotine dependence, unspecified, uncomplicated: Secondary | ICD-10-CM

## 2020-07-22 DIAGNOSIS — I6521 Occlusion and stenosis of right carotid artery: Secondary | ICD-10-CM

## 2020-07-22 DIAGNOSIS — Z8042 Family history of malignant neoplasm of prostate: Secondary | ICD-10-CM

## 2020-07-22 DIAGNOSIS — IMO0001 Reserved for inherently not codable concepts without codable children: Secondary | ICD-10-CM

## 2020-07-22 DIAGNOSIS — Z1211 Encounter for screening for malignant neoplasm of colon: Secondary | ICD-10-CM | POA: Diagnosis not present

## 2020-07-22 DIAGNOSIS — Z720 Tobacco use: Secondary | ICD-10-CM | POA: Diagnosis not present

## 2020-07-22 DIAGNOSIS — Z7185 Encounter for immunization safety counseling: Secondary | ICD-10-CM

## 2020-07-22 DIAGNOSIS — R7989 Other specified abnormal findings of blood chemistry: Secondary | ICD-10-CM

## 2020-07-22 DIAGNOSIS — J438 Other emphysema: Secondary | ICD-10-CM

## 2020-07-22 DIAGNOSIS — N400 Enlarged prostate without lower urinary tract symptoms: Secondary | ICD-10-CM

## 2020-07-22 DIAGNOSIS — H34812 Central retinal vein occlusion, left eye, with macular edema: Secondary | ICD-10-CM

## 2020-07-22 DIAGNOSIS — J439 Emphysema, unspecified: Secondary | ICD-10-CM

## 2020-07-22 DIAGNOSIS — H3522 Other non-diabetic proliferative retinopathy, left eye: Secondary | ICD-10-CM

## 2020-07-22 DIAGNOSIS — I6522 Occlusion and stenosis of left carotid artery: Secondary | ICD-10-CM

## 2020-07-22 DIAGNOSIS — E782 Mixed hyperlipidemia: Secondary | ICD-10-CM

## 2020-07-22 DIAGNOSIS — I350 Nonrheumatic aortic (valve) stenosis: Secondary | ICD-10-CM

## 2020-07-22 DIAGNOSIS — I700 Atherosclerosis of aorta: Secondary | ICD-10-CM

## 2020-07-22 DIAGNOSIS — I8393 Asymptomatic varicose veins of bilateral lower extremities: Secondary | ICD-10-CM

## 2020-07-22 DIAGNOSIS — H348121 Central retinal vein occlusion, left eye, with retinal neovascularization: Secondary | ICD-10-CM

## 2020-07-22 DIAGNOSIS — I739 Peripheral vascular disease, unspecified: Secondary | ICD-10-CM

## 2020-07-22 DIAGNOSIS — I351 Nonrheumatic aortic (valve) insufficiency: Secondary | ICD-10-CM

## 2020-07-22 DIAGNOSIS — Z122 Encounter for screening for malignant neoplasm of respiratory organs: Secondary | ICD-10-CM

## 2020-07-22 DIAGNOSIS — I878 Other specified disorders of veins: Secondary | ICD-10-CM

## 2020-07-22 DIAGNOSIS — R7301 Impaired fasting glucose: Secondary | ICD-10-CM

## 2020-07-22 DIAGNOSIS — I10 Essential (primary) hypertension: Secondary | ICD-10-CM

## 2020-07-22 NOTE — Patient Instructions (Addendum)
Today you had a preventative care visit or wellness visit.    Topics today may have included healthy lifestyle, diet, exercise, preventative care, vaccinations, sick and well care, proper use of emergency dept and after hours care, as well as other concerns.     Recommendations: Continue to return yearly for your annual wellness and preventative care visits.  This gives [place] a chance to discuss healthy lifestyle, exercise, vaccinations, review your chart record, and perform screenings where appropriate.  I recommend you see your eye doctor yearly for routine vision care.  I recommend you see your dentist yearly for routine dental care including hygiene visits twice yearly.   Vaccination recommendations were reviewed You are up to date on COVID vaccine  We will update your tetanus vaccine today  Please call your insurance about the following vaccines to make sure they are covered.  If so then make an appointment to come back for these vaccines:  Prevnar 13 pneumonia vaccine  Pneumococcal 23 pneumonia vaccine up-to-date.  Your last one was 2016  Shingles vaccine, 2 doses given 2 months apart   Screening for cancer: Colon cancer screening:  Please call insurance to see if they cover the Cologard vs the Colonoscopy I recommend you do one of these cancer screens  We discussed PSA, prostate exam, and prostate cancer screening risks/benefits.     Skin cancer screening: Check your skin regularly for new changes, growing lesions, or other lesions of concern Come in for evaluation if you have skin lesions of concern.  Lung cancer screening: If you have a greater than 30 pack year history of tobacco use, then you qualify for lung cancer screening with a chest CT scan.   Lets repeat your yearly CT screening.  We currently don't have screenings for other cancers besides breast, cervical, colon, and lung cancers.  If you have a strong family history of cancer or have other cancer screening  concerns, please let me know.    Bone health: Get at least 150 minutes of aerobic exercise weekly Get weight bearing exercise at least once weekly  We do bone density screening if you have lost weight over time or if family history of osteoporosis.     Heart health: Get at least 150 minutes of aerobic exercise weekly Limit alcohol It is important to maintain a healthy blood pressure and healthy cholesterol numbers    Separate significant issues discussed: Tobacco abuse - I recommend you quit smoking  Right carotid artery stenosis - followed by vascular surgery  Peripheral vascular disease - continue follow up with vascular surgery, walk for exercise daily, quit smoking  Retinopathy, history of renal vein occlusion, cataract - managed by ophthalmology and retinal specialists  Mixed hyperlipidemia - continue statin/Crestor daily, labs today  hypertension - BP controlled current off medication  impaired fasting glucose/prediabetes - labs today  Venous statis and varicose veins - exercise regularly, consider wearing compression hose over the counter daily  Aortic atherosclerosis - cholesterol build up in the aorta vessel.  Continue Crestor, stop smoking.   Prostate enlargement - continue Flomax every other day  Aortic stenosis - continue follow up with cardiology  Abnormal thyroid tests - updated labs today  Pulmonary emphysema on prior scan - STOP smoking to avoid lung problems and breathing problems.  Your lung breathing test today shows abnormal lung function/COPD changes!  Continue Plavix per cardiology notes.  Stop aspirin since you noticed some recent nosebleed.

## 2020-07-22 NOTE — Progress Notes (Signed)
Subjective:   HPI  Terry Gross is a 55 y.o. male who presents for Chief Complaint  Patient presents with  . Annual Exam    Physical with fasting labs     Patient Care Team: Kandon Hosking, Cleda Mccreedy as PCP - General (Family Medicine) Has dentures, doesn't see dentist Dr. Gretta Began, vascular surgery Dr. Fawn Kirk, retina specialist Dr. Jethro Bolus, ophthalmology Dr. Truett Mainland, cardiology   Concerns: Had mild recent nosebleed left nostril.  Had just started back on Plavix + aspirin.  Still smoking some, not heavy.  Maybe 2 beers per week.  Doing fine in general   Reviewed their medical, surgical, family, social, medication, and allergy history and updated chart as appropriate.  Past Medical History:  Diagnosis Date  . Aortic valve insufficiency 12/15/2014   Moderate by ECHO 12/15/14 normal LV function   . Back pain   . Carotid artery occlusion   . Cataract   . Hyperlipidemia   . Hypertension   . Impaired fasting blood sugar   . Loss of balance   . Peripheral vascular disease (HCC)   . Smoker     Past Surgical History:  Procedure Laterality Date  . CATARACT EXTRACTION    . COLONOSCOPY  07/2020   never    Family History  Problem Relation Age of Onset  . CVA Mother   . Other Mother        brain tumor  . Other Father        complications from prostate sugery  . Cancer Father        prostate  . Thyroid disease Neg Hx   . Heart disease Neg Hx   . Diabetes Neg Hx   . Kidney disease Neg Hx   . Hypertension Neg Hx   . Hyperlipidemia Neg Hx      Current Outpatient Medications:  .  brimonidine (ALPHAGAN) 0.15 % ophthalmic solution, INSTILL 1 DROP INTO LEFT EYE TWICE A DAY, Disp: 5 mL, Rfl: 1 .  clopidogrel (PLAVIX) 75 MG tablet, Take 1 tablet (75 mg total) by mouth daily., Disp: 30 tablet, Rfl: 0 .  latanoprost (XALATAN) 0.005 % ophthalmic solution, INSTILL 1 DROP IN LEFT EYE NIGHTLY, Disp: 2.5 mL, Rfl: 3 .  Omega-3 Fatty Acids (FISH OIL)  1200 MG CAPS, Take by mouth daily., Disp: , Rfl:  .  pantoprazole (PROTONIX) 40 MG tablet, Take 1 tablet (40 mg total) by mouth daily., Disp: 90 tablet, Rfl: 3 .  rosuvastatin (CRESTOR) 40 MG tablet, TAKE 1 TABLET BY MOUTH EVERY DAY, Disp: 30 tablet, Rfl: 2 .  tamsulosin (FLOMAX) 0.4 MG CAPS capsule, TAKE 1 CAPSULE BY MOUTH EVERY DAY (Patient taking differently: Take 0.4 mg by mouth. Takes 3 days per week), Disp: 90 capsule, Rfl: 0 .  Timolol Maleate 0.5 % (DAILY) SOLN, INSTILL 1 DROP INTO LEFT EYE EVERY MORNING AS DIRECTED, Disp: 2.5 mL, Rfl: 3 .  nicotine polacrilex (NICORETTE) 2 MG gum, Take 1 each (2 mg total) by mouth as needed for smoking cessation. (Patient not taking: Reported on 07/22/2020), Disp: 60 tablet, Rfl: 1 .  TIMOPTIC 0.5 % ophthalmic solution, Place 1 drop into the left eye daily. (Patient not taking: No sig reported), Disp: , Rfl:   No Known Allergies     Review of Systems Constitutional: -fever, -chills, -sweats, -unexpected weight change, -decreased appetite, -fatigue Allergy: -sneezing, -itching, -congestion Dermatology: -changing moles, --rash, -lumps ENT: -runny nose, -ear pain, -sore throat, -hoarseness, -sinus pain, -teeth pain, - ringing  in ears, -hearing loss, -nosebleeds Cardiology: -chest pain, -palpitations, -swelling, -difficulty breathing when lying flat, -waking up short of breath Respiratory: -cough, -shortness of breath, -difficulty breathing with exercise or exertion, -wheezing, -coughing up blood Gastroenterology: -abdominal pain, -nausea, -vomiting, -diarrhea, -constipation, -blood in stool, -changes in bowel movement, -difficulty swallowing or eating Hematology: -bleeding, -bruising  Musculoskeletal: -joint aches, -muscle aches, -joint swelling, -back pain, -neck pain, -cramping, -changes in gait Ophthalmology: denies vision changes, eye redness, itching, discharge Urology: -burning with urination, -difficulty urinating, -blood in urine, -urinary  frequency, -urgency, -incontinence Neurology: -headache, -weakness, -tingling, -numbness, -memory loss, -falls, -dizziness Psychology: -depressed mood, -agitation, -sleep problems Male GU: no testicular mass, pain, no lymph nodes swollen, no swelling, no rash.     Objective:  BP 138/88   Pulse 64   Ht 5\' 8"  (1.727 m)   Wt 147 lb 6.4 oz (66.9 kg)   SpO2 97%   BMI 22.41 kg/m    BP Readings from Last 3 Encounters:  07/22/20 138/88  04/20/20 139/83  11/14/19 123/65   Wt Readings from Last 3 Encounters:  07/22/20 147 lb 6.4 oz (66.9 kg)  04/20/20 150 lb (68 kg)  11/14/19 147 lb (66.7 kg)    General appearance: alert, no distress, WD/WN, Caucasian male Skin: Majority of bilateral anterior lower legs with patchy discoloration including some hypopigmented areas as well as hemosiderin staining consistent with venous stasis disease, no other worrisome lesions HEENT: normocephalic, conjunctiva/corneas normal, sclerae anicteric, PERRLA, EOMi, nares patent, no discharge or erythema, pharynx normal Oral cavity: MMM, tongue normal, edentulous Neck: supple, no lymphadenopathy, no thyromegaly, no masses, normal ROM, left carotid bruit Chest: non tender, normal shape and expansion Heart: 2/6 holosystolic murmur in upper sternal borders,  RRR, normal S1, S2, no murmurs Lungs: CTA bilaterally, no wheezes, rhonchi, or rales Abdomen: +bs, soft, non tender, non distended, no masses, no hepatomegaly, no splenomegaly, no bruits Back: non tender, normal ROM, no scoliosis Musculoskeletal: upper extremities non tender, no obvious deformity, normal ROM throughout, lower extremities non tender, no obvious deformity, normal ROM throughout Extremities: no edema, no cyanosis, no clubbing Pulses: 2+ symmetric, upper  extremities, normal cap refill, decreased pulses in extremities chronically consistent with vascular disease Neurological: alert, oriented x 3, CN2-12 intact, strength normal upper extremities and  lower extremities, sensation normal throughout, DTRs 2+ throughout, no cerebellar signs, gait normal Psychiatric: normal affect, behavior normal, pleasant  GU: normal male external genitalia,circumcised, nontender, no masses, no hernia, no lymphadenopathy Rectal: declined   Assessment and Plan :   Encounter Diagnoses  Name Primary?  . Encounter for health maintenance examination in adult Yes  . Vaccine counseling   . Tobacco abuse   . Stenosis of right carotid artery   . Screen for colon cancer   . PVD (peripheral vascular disease) (HCC)   . Occlusion of left carotid artery   . Nondiabetic proliferative retinopathy, left   . Moderate aortic stenosis   . Moderate aortic regurgitation   . Mixed hyperlipidemia   . Impaired fasting blood sugar   . Family history of prostate cancer in father   . Essential hypertension   . Encounter for screening for lung cancer   . Central retinal vein occlusion, left eye, with macular edema   . Central retinal vein occlusion, left eye, with retinal neovascularization   . Benign prostatic hyperplasia, unspecified whether lower urinary tract symptoms present   . Abnormal thyroid blood test   . Screening for prostate cancer   . Venous stasis   .  Varicose veins of both lower extremities, unspecified whether complicated   . Aortic atherosclerosis (HCC)   . Pulmonary emphysema, unspecified emphysema type (HCC)   . Need for Td vaccine   . Other emphysema (HCC)   . Smoking     Today you had a preventative care visit or wellness visit.    Topics today may have included healthy lifestyle, diet, exercise, preventative care, vaccinations, sick and well care, proper use of emergency dept and after hours care, as well as other concerns.     Recommendations: Continue to return yearly for your annual wellness and preventative care visits.  This gives us a chance to discuss healthy lifestyle, exercise, vaccinations, review your chart record, and perform  screenings where appropriate.  I recommend you see your eye doctor yearly for routine vision care.  I recommend you see your dentist yearly for routine dental care including hygiene visits twice yearly.   Vaccination recommendations were reviewed You are up to date on COVID vaccine  We will update your tetanus vaccine today  Please call your insurance about the following vaccines to make sure they are covered.  If so then make an appointment to come back for these vaccines:  Prevnar 13 pneumonia vaccine  Pneumococcal 23 pneumonia vaccine up-to-date.  Your last one was 2016  Shingles vaccine, 2 doses given 2 months apart   Screening for cancer: Colon cancer screening:  Please call insurance to see if they cover the Cologard vs the Colonoscopy I recommend you do one of these cancer screens  We discussed PSA, prostate exam, and prostate cancer screening risks/benefits.     Skin cancer screening: Check your skin regularly for new changes, growing lesions, or other lesions of concern Come in for evaluation if you have skin lesions of concern.  Lung cancer screening: If you have a greater than 30 pack year history of tobacco use, then you qualify for lung cancer screening with a chest CT scan.   Lets repeat your yearly CT screening.  We currently don't have screenings for other cancers besides breast, cervical, colon, and lung cancers.  If you have a strong family history of cancer or have other cancer screening concerns, please let me know.    Bone health: Get at least 150 minutes of aerobic exercise weekly Get weight bearing exercise at least once weekly  We do bone density screening if you have lost weight over time or if family history of osteoporosis.     Heart health: Get at least 150 minutes of aerobic exercise weekly Limit alcohol It is important to maintain a healthy blood pressure and healthy cholesterol numbers    Separate significant issues  discussed: Tobacco abuse - I recommend you quit smoking.  Was on Chantix last year.   Has cut down on tobacco.  Right carotid artery stenosis - followed by vascular surgery  Peripheral vascular disease - continue follow up with vascular surgery, walk for exercise daily, quit smoking  Retinopathy, history of renal vein occlusion, cataract - managed by ophthalmology and retinal specialists  Mixed hyperlipidemia - continue statin/Crestor daily, labs today  hypertension - BP controlled current off medication  impaired fasting glucose/prediabetes - labs today  Venous statis and varicose veins - exercise regularly, consider wearing compression hose over the counter daily  Aortic atherosclerosis - cholesterol build up in the aorta vessel.  Continue Crestor, stop smoking.   Prostate enlargement - continue Flomax every other day  Aortic stenosis - continue follow up with cardiology  Abnormal  thyroid tests - updated labs today  Pulmonary emphysema on prior scan - STOP smoking to avoid lung problems and breathing problems  I reviewed 11/2019 cardiology note, they plan to repeat echocardiogram in 11/2020 due to aortic stenosis.  GERD - doing fine on Pantoprazole   Jamieson was seen today for annual exam.  Diagnoses and all orders for this visit:  Encounter for health maintenance examination in adult -     Comprehensive metabolic panel -     CBC with Differential/Platelet -     Lipid panel -     PSA -     Hemoglobin A1c -     TSH -     CT CHEST LUNG CA SCREEN LOW DOSE W/O CM; Future  Vaccine counseling  Tobacco abuse  Stenosis of right carotid artery  Screen for colon cancer  PVD (peripheral vascular disease) (HCC)  Occlusion of left carotid artery  Nondiabetic proliferative retinopathy, left  Moderate aortic stenosis  Moderate aortic regurgitation  Mixed hyperlipidemia -     Lipid panel  Impaired fasting blood sugar -     Hemoglobin A1c  Family history of  prostate cancer in father -     PSA  Essential hypertension  Encounter for screening for lung cancer  Central retinal vein occlusion, left eye, with macular edema  Central retinal vein occlusion, left eye, with retinal neovascularization  Benign prostatic hyperplasia, unspecified whether lower urinary tract symptoms present -     PSA  Abnormal thyroid blood test -     TSH  Screening for prostate cancer -     PSA  Venous stasis  Varicose veins of both lower extremities, unspecified whether complicated  Aortic atherosclerosis (HCC)  Pulmonary emphysema, unspecified emphysema type (HCC)  Need for Td vaccine  Other emphysema (HCC) -     Spirometry with Graph  Smoking -     Spirometry with Graph    Follow-up pending labs, yearly for physical

## 2020-07-22 NOTE — Addendum Note (Signed)
Addended by: Victorio Palm on: 07/22/2020 04:55 PM   Modules accepted: Orders

## 2020-07-23 ENCOUNTER — Other Ambulatory Visit: Payer: Self-pay | Admitting: Medical

## 2020-07-23 LAB — CBC WITH DIFFERENTIAL/PLATELET
Basophils Absolute: 0 10*3/uL (ref 0.0–0.2)
Basos: 0 %
EOS (ABSOLUTE): 0.1 10*3/uL (ref 0.0–0.4)
Eos: 1 %
Hematocrit: 46.2 % (ref 37.5–51.0)
Hemoglobin: 15 g/dL (ref 13.0–17.7)
Immature Grans (Abs): 0 10*3/uL (ref 0.0–0.1)
Immature Granulocytes: 0 %
Lymphocytes Absolute: 3.1 10*3/uL (ref 0.7–3.1)
Lymphs: 33 %
MCH: 31.3 pg (ref 26.6–33.0)
MCHC: 32.5 g/dL (ref 31.5–35.7)
MCV: 97 fL (ref 79–97)
Monocytes Absolute: 0.6 10*3/uL (ref 0.1–0.9)
Monocytes: 7 %
Neutrophils Absolute: 5.5 10*3/uL (ref 1.4–7.0)
Neutrophils: 59 %
Platelets: 265 10*3/uL (ref 150–450)
RBC: 4.79 x10E6/uL (ref 4.14–5.80)
RDW: 12.7 % (ref 11.6–15.4)
WBC: 9.3 10*3/uL (ref 3.4–10.8)

## 2020-07-23 LAB — LIPID PANEL
Chol/HDL Ratio: 2.4 ratio (ref 0.0–5.0)
Cholesterol, Total: 127 mg/dL (ref 100–199)
HDL: 53 mg/dL (ref >39)
LDL Chol Calc (NIH): 63 mg/dL (ref 0–99)
Triglycerides: 48 mg/dL (ref 0–149)
VLDL Cholesterol Cal: 11 mg/dL (ref 5–40)

## 2020-07-23 LAB — PSA: Prostate Specific Ag, Serum: 0.4 ng/mL (ref 0.0–4.0)

## 2020-07-23 LAB — COMPREHENSIVE METABOLIC PANEL
ALT: 13 IU/L (ref 0–44)
AST: 20 IU/L (ref 0–40)
Albumin/Globulin Ratio: 2 (ref 1.2–2.2)
Albumin: 4.7 g/dL (ref 3.8–4.9)
Alkaline Phosphatase: 68 IU/L (ref 44–121)
BUN/Creatinine Ratio: 19 (ref 9–20)
BUN: 13 mg/dL (ref 6–24)
Bilirubin Total: 0.4 mg/dL (ref 0.0–1.2)
CO2: 22 mmol/L (ref 20–29)
Calcium: 9.4 mg/dL (ref 8.7–10.2)
Chloride: 103 mmol/L (ref 96–106)
Creatinine, Ser: 0.69 mg/dL — ABNORMAL LOW (ref 0.76–1.27)
Globulin, Total: 2.4 g/dL (ref 1.5–4.5)
Glucose: 95 mg/dL (ref 65–99)
Potassium: 5.1 mmol/L (ref 3.5–5.2)
Sodium: 143 mmol/L (ref 134–144)
Total Protein: 7.1 g/dL (ref 6.0–8.5)
eGFR: 108 mL/min/{1.73_m2} (ref >59)

## 2020-07-23 LAB — HEMOGLOBIN A1C
Est. average glucose Bld gHb Est-mCnc: 123 mg/dL
Hgb A1c MFr Bld: 5.9 % — ABNORMAL HIGH (ref 4.8–5.6)

## 2020-07-23 LAB — TSH: TSH: 0.572 u[IU]/mL (ref 0.450–4.500)

## 2020-07-23 MED ORDER — TAMSULOSIN HCL 0.4 MG PO CAPS: 0.4000 mg | ORAL_CAPSULE | Freq: Every day | ORAL | 3 refills | Status: DC

## 2020-07-23 MED ORDER — CLOPIDOGREL BISULFATE 75 MG PO TABS: 75.0000 mg | ORAL_TABLET | Freq: Every day | ORAL | 3 refills | Status: AC

## 2020-07-23 MED ORDER — ROSUVASTATIN CALCIUM 40 MG PO TABS: 40.0000 mg | ORAL_TABLET | Freq: Every day | ORAL | 3 refills | Status: DC

## 2020-07-28 ENCOUNTER — Other Ambulatory Visit: Payer: Self-pay

## 2020-07-28 ENCOUNTER — Ambulatory Visit (INDEPENDENT_AMBULATORY_CARE_PROVIDER_SITE_OTHER): Payer: 59 | Admitting: Ophthalmology

## 2020-07-28 ENCOUNTER — Encounter (INDEPENDENT_AMBULATORY_CARE_PROVIDER_SITE_OTHER): Payer: Self-pay | Admitting: Ophthalmology

## 2020-07-28 DIAGNOSIS — H211X2 Other vascular disorders of iris and ciliary body, left eye: Secondary | ICD-10-CM

## 2020-07-28 DIAGNOSIS — H2102 Hyphema, left eye: Secondary | ICD-10-CM

## 2020-07-28 DIAGNOSIS — H3582 Retinal ischemia: Secondary | ICD-10-CM | POA: Insufficient documentation

## 2020-07-28 DIAGNOSIS — H35372 Puckering of macula, left eye: Secondary | ICD-10-CM | POA: Diagnosis not present

## 2020-07-28 DIAGNOSIS — H34812 Central retinal vein occlusion, left eye, with macular edema: Secondary | ICD-10-CM

## 2020-07-28 DIAGNOSIS — H3522 Other non-diabetic proliferative retinopathy, left eye: Secondary | ICD-10-CM

## 2020-07-28 MED ORDER — BEVACIZUMAB 2.5 MG/0.1ML IZ SOSY
2.5000 mg | PREFILLED_SYRINGE | INTRAVITREAL | Status: AC | PRN
  Administered 2020-07-28: 2.5 mg via INTRAVITREAL

## 2020-07-28 NOTE — Assessment & Plan Note (Signed)
Chronic and stable some 10 weeks post injection Avastin with secondary clearance of media occurring thus currently with maximum visual potential maintained left eye

## 2020-07-28 NOTE — Assessment & Plan Note (Signed)
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

## 2020-07-28 NOTE — Assessment & Plan Note (Signed)
This condition is resolved post vitrectomy

## 2020-07-28 NOTE — Progress Notes (Signed)
07/28/2020     CHIEF COMPLAINT Patient presents for Retina Follow Up (10 Week CRVO f\u OS. Possible Avastin OS. OCT/Pt states vision has been stable. Denies new complaints.)   HISTORY OF PRESENT ILLNESS: Terry Gross is a 55 y.o. male who presents to the clinic today for:   HPI    Retina Follow Up    Patient presents with  CRVO/BRVO.  In left eye.  Severity is moderate.  Duration of 10 weeks.  Since onset it is stable. Additional comments: 10 Week CRVO f\u OS. Possible Avastin OS. OCT Pt states vision has been stable. Denies new complaints.       Last edited by Elyse Jarvis on 07/28/2020  8:31 AM. (History)      Referring physician: Jac Canavan, PA-C [address] [place],  [place] [address]  HISTORICAL INFORMATION:   Selected notes from the MEDICAL RECORD NUMBER    Lab Results  Component Value Date   HGBA1C 5.9 (H) 07/22/2020     CURRENT MEDICATIONS: Current Outpatient Medications (Ophthalmic Drugs)  Medication Sig  . brimonidine (ALPHAGAN) 0.15 % ophthalmic solution INSTILL 1 DROP INTO LEFT EYE TWICE A DAY  . latanoprost (XALATAN) 0.005 % ophthalmic solution INSTILL 1 DROP IN LEFT EYE NIGHTLY  . Timolol Maleate 0.5 % (DAILY) SOLN INSTILL 1 DROP INTO LEFT EYE EVERY MORNING AS DIRECTED  . TIMOPTIC 0.5 % ophthalmic solution Place 1 drop into the left eye daily. (Patient not taking: No sig reported)   No current facility-administered medications for this visit. (Ophthalmic Drugs)   Current Outpatient Medications (Other)  Medication Sig  . clopidogrel (PLAVIX) 75 MG tablet Take 1 tablet (75 mg total) by mouth daily.  . nicotine polacrilex (NICORETTE) 2 MG gum Take 1 each (2 mg total) by mouth as needed for smoking cessation. (Patient not taking: Reported on 07/22/2020)  . Omega-3 Fatty Acids (FISH OIL) 1200 MG CAPS Take by mouth daily.  . pantoprazole (PROTONIX) 40 MG tablet Take 1 tablet (40 mg total) by mouth daily.  . rosuvastatin (CRESTOR) 40 MG  tablet Take 1 tablet (40 mg total) by mouth daily.  . tamsulosin (FLOMAX) 0.4 MG CAPS capsule Take 1 capsule (0.4 mg total) by mouth daily.   No current facility-administered medications for this visit. (Other)      REVIEW OF SYSTEMS:    ALLERGIES No Known Allergies  PAST MEDICAL HISTORY Past Medical History:  Diagnosis Date  . Aortic valve insufficiency 12/15/2014   Moderate by ECHO 12/15/14 normal LV function   . Back pain   . Carotid artery occlusion   . Cataract   . Hyperlipidemia   . Hypertension   . Impaired fasting blood sugar   . Left epiretinal membrane 09/12/2019   This condition is resolved after vitrectomy and membrane peel was performed, 02/06/2019.  Topographic distortion completely released.  . Loss of balance   . Peripheral vascular disease (HCC)   . Smoker   . Vitreous hemorrhage of left eye (HCC) 06/10/2019   Past Surgical History:  Procedure Laterality Date  . CATARACT EXTRACTION    . COLONOSCOPY  07/2020   never    FAMILY HISTORY Family History  Problem Relation Age of Onset  . CVA Mother   . Other Mother        brain tumor  . Other Father        complications from prostate sugery  . Cancer Father        prostate  . Thyroid disease  Neg Hx   . Heart disease Neg Hx   . Diabetes Neg Hx   . Kidney disease Neg Hx   . Hypertension Neg Hx   . Hyperlipidemia Neg Hx     SOCIAL HISTORY Social History   Tobacco Use  . Smoking status: Current Every Day Smoker    Packs/day: 0.25    Years: 32.00    Pack years: 8.00    Types: Cigarettes  . Smokeless tobacco: Never Used  Vaping Use  . Vaping Use: Never used  Substance Use Topics  . Alcohol use: No  . Drug use: No         OPHTHALMIC EXAM: Base Eye Exam    Visual Acuity (Snellen - Linear)      Right Left   Dist [place] 20/25 -2 CF @ >1'   Dist ph [place]  NI       Tonometry (Tonopen, 8:36 AM)      Right Left   Pressure 15 16       Pupils      Pupils Dark Light Shape React APD   Right PERRL  4 3 Round Slow None   Left PERRL 6 6 Round Minimal None       Visual Fields (Counting fingers)      Left Right     Full   Restrictions Partial outer superior temporal, inferior temporal, superior nasal, inferior nasal deficiencies        Neuro/Psych    Oriented x3: Yes   Mood/Affect: Normal       Dilation    Left eye: 1.0% Mydriacyl, 2.5% Phenylephrine @ 8:36 AM        Slit Lamp and Fundus Exam    External Exam      Right Left   External Normal Normal       Slit Lamp Exam      Right Left   Lids/Lashes Normal Normal   Conjunctiva/Sclera White and quiet White and quiet   Cornea Clear Clear   Anterior Chamber Deep and quiet Deep, quiet   Iris Round and reactive Neovascularization, old and from ocular ischemia   Lens 1+ Nuclear sclerosis Posterior chamber intraocular lens, Open posterior capsule   Anterior Vitreous Normal 2+ Cells, Vitreous hemorrhage       Fundus Exam      Right Left   Posterior Vitreous  clear avitric   Disc  3+ Pallor, headlight and a fog   C/D Ratio  0.85   Macula  Microaneurysms   Vessels  Old central retinal artery occlusion with clear evidence of retinal nonperfusion peripherally.,  No neovascularization today   Periphery  Good PRP, 360,          IMAGING AND PROCEDURES  Imaging and Procedures for 07/28/20  OCT, Retina - OU - Both Eyes       Right Eye Quality was good. Scan locations included subfoveal. Central Foveal Thickness: 240. Progression has been stable. Findings include normal foveal contour.   Left Eye Quality was poor. Scan locations included juxtafoveal. Progression has been stable. Findings include abnormal foveal contour, outer retinal atrophy, central retinal atrophy.   Notes OS with diffuse retinal atrophy now with clear media post intravitreal Avastin to control the indolent ongoing ocular ischemic induced neovascularization of the anterior segment       Intravitreal Injection, Pharmacologic Agent - OS - Left Eye        Time Out 07/28/2020. 9:30 AM. Confirmed correct patient, procedure, site, and patient consented.  Anesthesia Topical anesthesia was used. Anesthetic medications included Akten 3.5%.   Procedure Preparation included 10% betadine to eyelids, 5% betadine to ocular surface, Ofloxacin . A 30 gauge needle was used.   Injection:  2.5 mg Bevacizumab (AVASTIN) 2.5mg /0.78mL SOSY   NDC: 81448-185-63, Lot: [id number]   Route: Intravitreal, Site: Left Eye  Post-op Post injection exam found visual acuity of at least counting fingers. The patient tolerated the procedure well. There were no complications. The patient received written and verbal post procedure care education. Post injection medications were not given.                 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 Central retinal vein occlusion with macular edema of left eye  H34.8120 OCT, Retina - OU - Both Eyes    Intravitreal Injection, Pharmacologic Agent - OS - Left Eye    bevacizumab (AVASTIN) SOSY 2.5 mg  2. Rubeosis iridis of left eye  H21.1X2   3. Hyphema, left eye  H21.02   4. Left epiretinal membrane  H35.372   5. Nondiabetic proliferative retinopathy, left  H35.22   6. Ocular ischemic syndrome  H35.82     1.  2.  3.  Ophthalmic Meds Ordered this visit:  Meds ordered this encounter   Medications  . bevacizumab (AVASTIN) SOSY 2.5 mg       Return in about 10 weeks (around 10/06/2020) for DILATE OU, AVASTIN OCT, OS.  There are no Patient Instructions on file for this visit.   Explained the diagnoses, plan, and follow up with the patient and they expressed understanding.  Patient expressed understanding of the importance of proper follow up care.   Alford Highland Junelle Hashemi M.D. Diseases & Surgery of the Retina and Vitreous [facility] 07/28/20     Abbreviations: M myopia (nearsighted); A astigmatism; H hyperopia (farsighted); P presbyopia; Mrx spectacle prescription;  CTL contact lenses; OD right eye; OS left eye; OU both eyes  XT exotropia; ET esotropia; PEK punctate epithelial keratitis; PEE punctate epithelial erosions; DES dry eye syndrome; MGD meibomian gland dysfunction; ATs artificial tears; PFAT's preservative free artificial tears; NSC nuclear sclerotic cataract; PSC posterior subcapsular cataract; ERM epi-retinal membrane; PVD posterior vitreous detachment; RD retinal detachment; DM diabetes mellitus; DR diabetic retinopathy; NPDR non-proliferative diabetic retinopathy; PDR proliferative diabetic retinopathy; CSME clinically significant macular edema; DME diabetic macular edema; dbh dot blot hemorrhages; CWS cotton wool spot; POAG primary open angle glaucoma; C/D cup-to-disc ratio; HVF humphrey visual field; GVF goldmann visual field; OCT optical coherence tomography; IOP intraocular pressure; BRVO Branch retinal vein occlusion; CRVO central retinal vein occlusion; CRAO central retinal artery occlusion; BRAO branch retinal artery occlusion; RT retinal tear; SB scleral buckle; PPV pars plana vitrectomy; VH Vitreous hemorrhage; PRP panretinal laser photocoagulation; IVK intravitreal kenalog; VMT vitreomacular traction; MH Macular hole;  NVD neovascularization of the disc; NVE neovascularization elsewhere; AREDS age related eye disease study; ARMD age related  macular degeneration; POAG primary open angle glaucoma;  EBMD epithelial/anterior basement membrane dystrophy; ACIOL anterior chamber intraocular lens; IOL intraocular lens; PCIOL posterior chamber intraocular lens; Phaco/IOL phacoemulsification with intraocular lens placement; [place] photorefractive keratectomy; LASIK laser assisted in situ keratomileusis; HTN hypertension; DM diabetes mellitus; COPD chronic obstructive pulmonary disease

## 2020-07-28 NOTE — Assessment & Plan Note (Signed)
Continue with antivegF to prevent anterior segment neovascularization and chronic recurrence of hyphema/vitreous hemorrhage

## 2020-07-28 NOTE — Assessment & Plan Note (Signed)
This condition has cleared now on intravitreal antivegF

## 2020-08-12 LAB — COLOGUARD: Cologuard: NEGATIVE

## 2020-08-15 LAB — COLOGUARD

## 2020-08-19 ENCOUNTER — Encounter: Payer: Self-pay | Admitting: Medical

## 2020-09-05 ENCOUNTER — Other Ambulatory Visit (INDEPENDENT_AMBULATORY_CARE_PROVIDER_SITE_OTHER): Payer: Self-pay | Admitting: Ophthalmology

## 2020-09-26 ENCOUNTER — Other Ambulatory Visit (INDEPENDENT_AMBULATORY_CARE_PROVIDER_SITE_OTHER): Payer: Self-pay | Admitting: Ophthalmology

## 2020-09-28 ENCOUNTER — Other Ambulatory Visit (INDEPENDENT_AMBULATORY_CARE_PROVIDER_SITE_OTHER): Payer: Self-pay | Admitting: Ophthalmology

## 2020-09-29 ENCOUNTER — Other Ambulatory Visit (INDEPENDENT_AMBULATORY_CARE_PROVIDER_SITE_OTHER): Payer: Self-pay | Admitting: Ophthalmology

## 2020-10-01 ENCOUNTER — Other Ambulatory Visit (INDEPENDENT_AMBULATORY_CARE_PROVIDER_SITE_OTHER): Payer: Self-pay | Admitting: Ophthalmology

## 2020-10-06 ENCOUNTER — Encounter (INDEPENDENT_AMBULATORY_CARE_PROVIDER_SITE_OTHER): Payer: 59 | Admitting: Ophthalmology

## 2020-10-12 ENCOUNTER — Encounter (INDEPENDENT_AMBULATORY_CARE_PROVIDER_SITE_OTHER): Payer: 59 | Admitting: Ophthalmology

## 2020-10-13 ENCOUNTER — Other Ambulatory Visit: Payer: Self-pay

## 2020-10-13 ENCOUNTER — Encounter (INDEPENDENT_AMBULATORY_CARE_PROVIDER_SITE_OTHER): Payer: 59 | Admitting: Ophthalmology

## 2020-10-13 ENCOUNTER — Ambulatory Visit (INDEPENDENT_AMBULATORY_CARE_PROVIDER_SITE_OTHER): Payer: 59 | Admitting: Ophthalmology

## 2020-10-13 ENCOUNTER — Encounter (INDEPENDENT_AMBULATORY_CARE_PROVIDER_SITE_OTHER): Payer: Self-pay | Admitting: Ophthalmology

## 2020-10-13 DIAGNOSIS — H348121 Central retinal vein occlusion, left eye, with retinal neovascularization: Secondary | ICD-10-CM | POA: Diagnosis not present

## 2020-10-13 DIAGNOSIS — H3522 Other non-diabetic proliferative retinopathy, left eye: Secondary | ICD-10-CM | POA: Diagnosis not present

## 2020-10-13 DIAGNOSIS — H34812 Central retinal vein occlusion, left eye, with macular edema: Secondary | ICD-10-CM | POA: Diagnosis not present

## 2020-10-13 DIAGNOSIS — H2511 Age-related nuclear cataract, right eye: Secondary | ICD-10-CM | POA: Diagnosis not present

## 2020-10-13 DIAGNOSIS — H3412 Central retinal artery occlusion, left eye: Secondary | ICD-10-CM

## 2020-10-13 DIAGNOSIS — H2102 Hyphema, left eye: Secondary | ICD-10-CM

## 2020-10-13 MED ORDER — BEVACIZUMAB 2.5 MG/0.1ML IZ SOSY
2.5000 mg | PREFILLED_SYRINGE | INTRAVITREAL | Status: AC | PRN
  Administered 2020-10-13: 2.5 mg via INTRAVITREAL

## 2020-10-13 NOTE — Progress Notes (Signed)
10/13/2020     CHIEF COMPLAINT Patient presents for Retina Follow Up (11 Wk F/U OU, poss Avastin OS//Pt denies noticeable changes to [place] OU since last visit. Pt denies ocular pain, flashes of light, or floaters OU. //)   HISTORY OF PRESENT ILLNESS: Terry Gross is a 55 y.o. male who presents to the clinic today for:   HPI    Retina Follow Up    Diagnosis: CRVO/BRVO   Laterality: left eye   Onset: 11 weeks ago   Severity: moderate   Duration: 11 weeks   Course: stable   Comments: 11 Wk F/U OU, poss Avastin OS  Pt denies noticeable changes to [place] OU since last visit. Pt denies ocular pain, flashes of light, or floaters OU.          Last edited by Ileana Roup, COA on 10/13/2020  3:10 PM. (History)      Referring physician: Jac Canavan, PA-C [address] [place],  [place] [address]  HISTORICAL INFORMATION:   Selected notes from the MEDICAL RECORD NUMBER    Lab Results  Component Value Date   HGBA1C 5.9 (H) 07/22/2020     CURRENT MEDICATIONS: Current Outpatient Medications (Ophthalmic Drugs)  Medication Sig  . brimonidine (ALPHAGAN) 0.15 % ophthalmic solution INSTILL 1 DROP INTO LEFT EYE TWICE A DAY  . latanoprost (XALATAN) 0.005 % ophthalmic solution INSTILL 1 DROP IN LEFT EYE NIGHTLY  . timolol (TIMOPTIC) 0.5 % ophthalmic solution Place 1 drop into the left eye daily.   No current facility-administered medications for this visit. (Ophthalmic Drugs)   Current Outpatient Medications (Other)  Medication Sig  . clopidogrel (PLAVIX) 75 MG tablet Take 1 tablet (75 mg total) by mouth daily.  . nicotine polacrilex (NICORETTE) 2 MG gum Take 1 each (2 mg total) by mouth as needed for smoking cessation. (Patient not taking: Reported on 07/22/2020)  . Omega-3 Fatty Acids (FISH OIL) 1200 MG CAPS Take by mouth daily.  . pantoprazole (PROTONIX) 40 MG tablet Take 1 tablet (40 mg total) by mouth daily.  . rosuvastatin (CRESTOR) 40 MG tablet Take 1 tablet (40 mg  total) by mouth daily.  . tamsulosin (FLOMAX) 0.4 MG CAPS capsule Take 1 capsule (0.4 mg total) by mouth daily.   No current facility-administered medications for this visit. (Other)      REVIEW OF SYSTEMS:    ALLERGIES No Known Allergies  PAST MEDICAL HISTORY Past Medical History:  Diagnosis Date  . Aortic valve insufficiency 12/15/2014   Moderate by ECHO 12/15/14 normal LV function   . Back pain   . Carotid artery occlusion   . Cataract   . Hyperlipidemia   . Hypertension   . Impaired fasting blood sugar   . Left epiretinal membrane 09/12/2019   This condition is resolved after vitrectomy and membrane peel was performed, 02/06/2019.  Topographic distortion completely released.  . Loss of balance   . Peripheral vascular disease (HCC)   . Smoker   . Vitreous hemorrhage of left eye (HCC) 06/10/2019   Past Surgical History:  Procedure Laterality Date  . CATARACT EXTRACTION    . COLONOSCOPY  07/2020   never    FAMILY HISTORY Family History  Problem Relation Age of Onset  . CVA Mother   . Other Mother        brain tumor  . Other Father        complications from prostate sugery  . Cancer Father        prostate  .  Thyroid disease Neg Hx   . Heart disease Neg Hx   . Diabetes Neg Hx   . Kidney disease Neg Hx   . Hypertension Neg Hx   . Hyperlipidemia Neg Hx     SOCIAL HISTORY Social History   Tobacco Use  . Smoking status: Current Every Day Smoker    Packs/day: 0.25    Years: 32.00    Pack years: 8.00    Types: Cigarettes  . Smokeless tobacco: Never Used  Vaping Use  . Vaping Use: Never used  Substance Use Topics  . Alcohol use: No  . Drug use: No         OPHTHALMIC EXAM: Base Eye Exam    Visual Acuity (ETDRS)      Right Left   Dist [place] 20/25 +1 CF @ face   Dist ph [place]  NI       Tonometry (Tonopen, 3:13 PM)      Right Left   Pressure 16 18       Pupils      Dark Light Shape React APD   Right 5 5 Round Minimal None   Left 7 7 Round Minimal  None       Visual Fields (Counting fingers)      Left Right     Full   Restrictions Partial outer superior temporal, inferior temporal, superior nasal, inferior nasal deficiencies        Extraocular Movement      Right Left    Full Full       Neuro/Psych    Oriented x3: Yes   Mood/Affect: Normal       Dilation    Both eyes: 1.0% Mydriacyl, 2.5% Phenylephrine @ 3:13 PM        Slit Lamp and Fundus Exam    External Exam      Right Left   External Normal Normal       Slit Lamp Exam      Right Left   Lids/Lashes Normal Normal   Conjunctiva/Sclera White and quiet White and quiet   Cornea Clear Clear   Anterior Chamber Deep and quiet Deep, quiet   Iris Round and reactive Neovascularization, old and from ocular ischemia   Lens 1+ Nuclear sclerosis Posterior chamber intraocular lens, Open posterior capsule   Anterior Vitreous Normal 2+ Cells, Vitreous hemorrhage       Fundus Exam      Right Left   Posterior Vitreous Normal clear avitric   Disc Normal 3+ Pallor, headlight and a fog   C/D Ratio 0.45 0.85   Macula Normal Microaneurysms   Vessels Normal Old central retinal artery occlusion with clear evidence of retinal nonperfusion peripherally.,  No neovascularization today   Periphery Normal Good PRP, 360,          IMAGING AND PROCEDURES  Imaging and Procedures for 10/13/20  OCT, Retina - OU - Both Eyes       Right Eye Quality was good. Scan locations included subfoveal. Central Foveal Thickness: 245. Progression has been stable. Findings include normal foveal contour.   Left Eye Quality was poor. Scan locations included juxtafoveal. Progression has been stable. Findings include abnormal foveal contour, outer retinal atrophy, central retinal atrophy.   Notes OS with diffuse retinal atrophy now with clear media post intravitreal Avastin to control the indolent ongoing ocular ischemic induced neovascularization of the anterior segment       Intravitreal  Injection, Pharmacologic Agent - OS - Left Eye  Time Out 10/13/2020. 4:32 PM. Confirmed correct patient, procedure, site, and patient consented.   Anesthesia Topical anesthesia was used. Anesthetic medications included [place] 3.5%.   Procedure Preparation included 10% betadine to eyelids, 5% betadine to ocular surface, Ofloxacin . A 30 gauge needle was used.   Injection:  2.5 mg Bevacizumab (AVASTIN) 2.5mg /0.62mL SOSY   NDC: 48185-631-49, Lot: [id number]   Route: Intravitreal, Site: Left Eye  Post-op Post injection exam found visual acuity of at least counting fingers. The patient tolerated the procedure well. There were no complications. The patient received written and verbal post procedure care education. Post injection medications were not given.                 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, blocked internal carotid artery  Nondiabetic proliferative retinopathy, left History of recurrences of anterior segment neovascularization due to ocular ischemic syndrome, controlled vitreous hemorrhage recurrences by use of intravitreal Avastin repeat today  Nuclear sclerotic cataract of right eye OD with good vision will observe  Hyphema, left eye No recurrences and has control recurrences of this once quarterly injections of antivegF delivered  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 Central retinal vein occlusion with macular edema of left eye  H34.8120 OCT, Retina - OU - Both Eyes    Intravitreal Injection, Pharmacologic Agent - OS - Left Eye    bevacizumab (AVASTIN) SOSY 2.5 mg  2. Central retinal vein occlusion, left eye, with retinal neovascularization  H34.8121   3. Nondiabetic proliferative retinopathy, left   H35.22   4. Nuclear sclerotic cataract of right eye  H25.11   5. Hyphema, left eye  H21.02   6. Central retinal artery occlusion of left eye  H34.12     1.  OS with stabilized ocular condition with combined central retinal artery central vein occlusion resulting in ocular ischemic syndrome due to proximal complete occlusion of the internal carotid artery left eye ipsilateral, intracranial.  Successful prevention of recurrence of hyphema and anterior 7 neovascularization with ongoing intravitreal Avastin left eye now 11-week follow-up.  Repeat injection today and examination next in one quarter, 3 months  2.  OD stable looks great  3.  Ophthalmic Meds Ordered this visit:  Meds ordered this encounter  Medications  . bevacizumab (AVASTIN) SOSY 2.5 mg       Return in about 3 months (around 01/13/2021) for dilate, OS, AVASTIN OCT.  There are no Patient Instructions on file for this visit.   Explained the diagnoses, plan, and follow up with the patient and they expressed understanding.  Patient expressed understanding of the importance of proper follow up care.   Alford Highland Maleik Vanderzee M.D. Diseases & Surgery of the Retina and Vitreous [facility] 10/13/20     Abbreviations: M myopia (nearsighted); A astigmatism; H hyperopia (farsighted); P presbyopia; Mrx spectacle prescription;  CTL contact lenses; OD right eye; OS left eye; OU both eyes  XT exotropia; ET esotropia; PEK punctate epithelial keratitis; PEE punctate epithelial erosions; DES dry eye syndrome; MGD meibomian gland dysfunction; ATs artificial tears; PFAT's preservative free artificial tears; NSC nuclear sclerotic cataract; PSC posterior subcapsular cataract; ERM epi-retinal membrane; PVD posterior vitreous detachment; RD retinal detachment; DM diabetes mellitus; DR diabetic retinopathy; NPDR non-proliferative diabetic retinopathy; PDR proliferative diabetic  retinopathy; CSME clinically significant macular edema; DME  diabetic macular edema; dbh dot blot hemorrhages; CWS cotton wool spot; POAG primary open angle glaucoma; C/D cup-to-disc ratio; HVF humphrey visual field; GVF goldmann visual field; OCT optical coherence tomography; IOP intraocular pressure; BRVO Branch retinal vein occlusion; CRVO central retinal vein occlusion; CRAO central retinal artery occlusion; BRAO branch retinal artery occlusion; RT retinal tear; SB scleral buckle; PPV pars plana vitrectomy; VH Vitreous hemorrhage; PRP panretinal laser photocoagulation; IVK intravitreal kenalog; VMT vitreomacular traction; MH Macular hole;  NVD neovascularization of the disc; NVE neovascularization elsewhere; AREDS age related eye disease study; ARMD age related macular degeneration; POAG primary open angle glaucoma; EBMD epithelial/anterior basement membrane dystrophy; ACIOL anterior chamber intraocular lens; IOL intraocular lens; PCIOL posterior chamber intraocular lens; Phaco/IOL phacoemulsification with intraocular lens placement; PRK photorefractive keratectomy; LASIK laser assisted in situ keratomileusis; HTN hypertension; DM diabetes mellitus; COPD chronic obstructive pulmonary disease

## 2020-10-13 NOTE — Assessment & Plan Note (Signed)
OD with good vision will observe

## 2020-10-13 NOTE — Assessment & Plan Note (Signed)
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

## 2020-10-13 NOTE — Assessment & Plan Note (Signed)
History of recurrences of anterior segment neovascularization due to ocular ischemic syndrome, controlled vitreous hemorrhage recurrences by use of intravitreal Avastin repeat today

## 2020-10-13 NOTE — Assessment & Plan Note (Signed)
With optic nerve involvement accounts for acuity secondary to proximal CNS vascular disease

## 2020-10-13 NOTE — Assessment & Plan Note (Signed)
No recurrences and has control recurrences of this once quarterly injections of antivegF delivered

## 2020-10-23 ENCOUNTER — Other Ambulatory Visit: Payer: 59

## 2020-10-29 ENCOUNTER — Ambulatory Visit: Payer: 59

## 2020-10-29 ENCOUNTER — Other Ambulatory Visit: Payer: Self-pay

## 2020-10-29 DIAGNOSIS — I351 Nonrheumatic aortic (valve) insufficiency: Secondary | ICD-10-CM

## 2020-10-29 DIAGNOSIS — I350 Nonrheumatic aortic (valve) stenosis: Secondary | ICD-10-CM

## 2020-11-13 ENCOUNTER — Ambulatory Visit: Payer: 59 | Admitting: Cardiology

## 2020-12-03 ENCOUNTER — Ambulatory Visit: Payer: 59 | Admitting: Cardiology

## 2020-12-03 ENCOUNTER — Encounter: Payer: Self-pay | Admitting: Cardiology

## 2020-12-03 ENCOUNTER — Other Ambulatory Visit: Payer: Self-pay

## 2020-12-03 VITALS — BP 122/72 | HR 77 | Temp 98.7°F | Ht 68.0 in | Wt 148.0 lb

## 2020-12-03 DIAGNOSIS — I351 Nonrheumatic aortic (valve) insufficiency: Secondary | ICD-10-CM

## 2020-12-03 DIAGNOSIS — I350 Nonrheumatic aortic (valve) stenosis: Secondary | ICD-10-CM

## 2020-12-03 DIAGNOSIS — I6522 Occlusion and stenosis of left carotid artery: Secondary | ICD-10-CM

## 2020-12-03 NOTE — Progress Notes (Signed)
Patient referred by Terry Hurl, PA-C for aortic insufficiency  Subjective:   Terry Gross, male    DOB: 01/18/1966, 55 y.o.   MRN: [id number]   Chief Complaint  Patient presents with   Moderate aortic stenosis    HPI  55 year old male with hypertension, hyperlipidemia, mod AS, mod AI, Lt IC occlusion, retinal artery occlusion, tobacco dependence  He is seeing ophthalmologist regularly for vitreous hemorrhage of left eye and getting Avastin injections. His vision in his left eye remains close to nil.  Blood pressure is well controlled. He follows with vascular surgery re: carotid disease. Blood pressure is controlled. He denies chest pain, shortness of breath, palpitations, leg edema, orthopnea, PND, TIA/syncope. Reviewed recent echocardiogram result with the patient, details below. He had quit smoking, but started smoking back after he lost his mother. He is down to 5 cigarettes/day now.    Initial consultation HPI 05/2019: Mild aortic insufficiency, moderate aortic stenosis was noted on outside echocardiogram in May 2020.  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 His major issue over the last few years has been left eye. He was told to have problems with his retina, as well as cataract. He has undergone several surgeries, and has had minimal improvement in his vision.   Patient is originally from [place], has been living in [place] since 1995 with his wife, works at [employer]. 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 He denies chest pain, shortness of breath, palpitations, leg edema, orthopnea, PND, TIA/syncope.  His lipid panel has improved remarkably on atorvastatin.    Current Outpatient Medications on File Prior to Visit  Medication Sig Dispense Refill   brimonidine (ALPHAGAN) 0.15 % ophthalmic solution INSTILL 1 DROP INTO LEFT EYE TWICE A DAY 5  mL 1   clopidogrel (PLAVIX) 75 MG tablet Take 1 tablet (75 mg total) by mouth daily. 90 tablet 3   latanoprost (XALATAN) 0.005 % ophthalmic solution INSTILL 1 DROP IN LEFT EYE NIGHTLY 2.5 mL 3   nicotine polacrilex (NICORETTE) 2 MG gum Take 1 each (2 mg total) by mouth as needed for smoking cessation. 60 tablet 1   Omega-3 Fatty Acids (FISH OIL) 1200 MG CAPS Take by mouth daily.     rosuvastatin (CRESTOR) 40 MG tablet Take 1 tablet (40 mg total) by mouth daily. 90 tablet 3   tamsulosin (FLOMAX) 0.4 MG CAPS capsule Take 1 capsule (0.4 mg total) by mouth daily. 90 capsule 3   timolol (TIMOPTIC) 0.5 % ophthalmic solution Place 1 drop into the left eye daily. 5 mL 3   pantoprazole (PROTONIX) 40 MG tablet Take 1 tablet (40 mg total) by mouth daily. (Patient not taking: Reported on 12/03/2020) 90 tablet 3   No current facility-administered medications on file prior to visit.    Cardiovascular and other pertinent studies:  EKG 12/03/2020: Sinus rhythm 70 npm Normal EKG  Echocardiogram 10/29/2020:  Normal LV systolic function with visual EF 55-60%. Left ventricle cavity  is normal in size. Mild left ventricular hypertrophy. Normal global wall  motion. Normal diastolic filling pattern, normal LAP.  Left atrial cavity is mildly dilated.  Mild aortic stenosis. Peak velocity 2.28ms, Peak Gradient 269mg, Mean  Gradient 16.58m22m, AVA 1.9cm.  Moderate to severe aortic regurgitation.    Mild (Grade I) mitral regurgitation.  Mild tricuspid regurgitation. No evidence of pulmonary hypertension.  Prior study dated 10/16/2019: LVEF 50-55%, G1DD, moderate AR,  moderate AS,  moderate MR.   Vascular US 04/09/2019:  Right Carotid: Velocities in the right ICA are consistent with a 40-59% stenosis. Left Carotid: Evidence consistent with a total occlusion of the left ICA. Vertebrals:  Bilateral vertebral arteries demonstrate antegrade flow.  Subclavians: Right subclavian artery flow was turbulent. Normal flow  hemodynamics were seen in the left subclavian artery.   Recent labs: 07/22/2020: Glucose 95, BUN/Cr 13/0.69. EGFR normal. Na/K 143/5.1. Rest of the CMP normal H/H 15/46. MCV 97. Platelets 265 HbA1C 5.9% Chol 127, TG 48, HDL 53, LDL 63 TSH 0.5 normal  03/13/2019: Glucose 92, BUN/Cr 11/0.84. EGFR 99. Na/K 143/4.8. Rest of the CMP normal H/H 15/45. MCV 94. Platelets 220 HbA1C 5.8% Chol 111, TG 54, HDL 46, LDL 53 TSH 0.39 (low) Free T4 1.5 normal   Review of Systems  Eyes:  Positive for vision loss in left eye.  Cardiovascular:  Negative for chest pain, dyspnea on exertion, leg swelling, palpitations and syncope.         Vitals:   12/03/20 1316  BP: 122/72  Pulse: 77  Temp: 98.7 F (37.1 C)  SpO2: 100%     Body mass index is 22.5 kg/m. Filed Weights   12/03/20 1316  Weight: 148 lb (67.1 kg)     Objective:   Physical Exam Vitals and nursing note reviewed.  Constitutional:      General: He is not in acute distress. Neck:     Vascular: No JVD.  Cardiovascular:     Rate and Rhythm: Normal rate and regular rhythm.     Heart sounds: Murmur heard.  Harsh midsystolic murmur is present with a grade of 2/6 at the upper right sternal border radiating to the neck.  High-pitched blowing decrescendo early diastolic murmur is present with a grade of 2/4 at the upper right sternal border radiating to the apex.  Pulmonary:     Effort: Pulmonary effort is normal.     Breath sounds: Normal breath sounds. No wheezing or rales.  Musculoskeletal:     Right lower leg: No edema.     Left lower leg: No edema.        Assessment & Recommendations:   55 year old male with hypertension, hyperlipidemia, peripheral artery disease, tobacco dependence, moderate aortic stenosis, mild aortic valve insufficiency.  Mild AS, mod -severe AI: Stable, clinically asymptomatic. Repeat echocardiogram in 10/2021  Hyperlipidemia: Well controlled on rosuvastatin 40 mg daily.   PAD: Lt IC  occlusion, Rt IC moderate stenosis. Continue Plavix, statin.   Continue f/u /vascular surgery  Tobacco dependence: Continue nicotine gum Re-emphasized importance of cessation.  Follow-up in 1 year after repeat echocardiogram  Nigel Mormon, MD [facility] Pager: [phone] Office: [phone]

## 2020-12-08 ENCOUNTER — Other Ambulatory Visit (INDEPENDENT_AMBULATORY_CARE_PROVIDER_SITE_OTHER): Payer: Self-pay | Admitting: Ophthalmology

## 2021-01-12 ENCOUNTER — Other Ambulatory Visit: Payer: Self-pay

## 2021-01-12 ENCOUNTER — Ambulatory Visit (INDEPENDENT_AMBULATORY_CARE_PROVIDER_SITE_OTHER): Payer: 59 | Admitting: Ophthalmology

## 2021-01-12 ENCOUNTER — Encounter (INDEPENDENT_AMBULATORY_CARE_PROVIDER_SITE_OTHER): Payer: Self-pay | Admitting: Ophthalmology

## 2021-01-12 DIAGNOSIS — H34812 Central retinal vein occlusion, left eye, with macular edema: Secondary | ICD-10-CM

## 2021-01-12 DIAGNOSIS — H211X2 Other vascular disorders of iris and ciliary body, left eye: Secondary | ICD-10-CM

## 2021-01-12 DIAGNOSIS — H2102 Hyphema, left eye: Secondary | ICD-10-CM | POA: Diagnosis not present

## 2021-01-12 DIAGNOSIS — H2511 Age-related nuclear cataract, right eye: Secondary | ICD-10-CM | POA: Diagnosis not present

## 2021-01-12 DIAGNOSIS — H4052X2 Glaucoma secondary to other eye disorders, left eye, moderate stage: Secondary | ICD-10-CM

## 2021-01-12 DIAGNOSIS — H3582 Retinal ischemia: Secondary | ICD-10-CM | POA: Diagnosis not present

## 2021-01-12 MED ORDER — BEVACIZUMAB 2.5 MG/0.1ML IZ SOSY
2.5000 mg | PREFILLED_SYRINGE | INTRAVITREAL | Status: AC | PRN
  Administered 2021-01-12: 2.5 mg via INTRAVITREAL

## 2021-01-12 NOTE — Progress Notes (Signed)
01/12/2021     CHIEF COMPLAINT Patient presents for  Chief Complaint  Patient presents with   Retina Follow Up      HISTORY OF PRESENT ILLNESS: Terry Gross is a 55 y.o. male who presents to the clinic today for:   HPI     Retina Follow Up   Patient presents with  CRVO/BRVO.  In left eye.  This started 13 weeks ago.  Severity is mild.  Duration of 13 weeks.  Since onset it is stable.        Comments   13 week fu OS and Avastin OS Pt states VA OU stable since last visit. Pt denies FOL, floaters, or ocular pain OU. ' Pt reports using Brimonidine BID OS and Timolol QAM OS and Latanoprost QHS OS       Last edited by Demetrios Loll, COA on 01/12/2021  1:28 PM.      Referring physician: Mateo Flow, MD [address] [place],  [place] [address]  HISTORICAL INFORMATION:   Selected notes from the MEDICAL RECORD NUMBER    Lab Results  Component Value Date   HGBA1C 5.9 (H) 07/22/2020     CURRENT MEDICATIONS: Current Outpatient Medications (Ophthalmic Drugs)  Medication Sig   brimonidine (ALPHAGAN) 0.15 % ophthalmic solution INSTILL 1 DROP INTO LEFT EYE TWICE A DAY   latanoprost (XALATAN) 0.005 % ophthalmic solution INSTILL 1 DROP IN LEFT EYE NIGHTLY   timolol (TIMOPTIC) 0.5 % ophthalmic solution Place 1 drop into the left eye daily.   No current facility-administered medications for this visit. (Ophthalmic Drugs)   Current Outpatient Medications (Other)  Medication Sig   clopidogrel (PLAVIX) 75 MG tablet Take 1 tablet (75 mg total) by mouth daily.   nicotine polacrilex (NICORETTE) 2 MG gum Take 1 each (2 mg total) by mouth as needed for smoking cessation.   Omega-3 Fatty Acids (FISH OIL) 1200 MG CAPS Take by mouth daily.   pantoprazole (PROTONIX) 40 MG tablet Take 1 tablet (40 mg total) by mouth daily. (Patient not taking: Reported on 12/03/2020)   rosuvastatin (CRESTOR) 40 MG tablet Take 1 tablet (40 mg total) by mouth daily.   tamsulosin (FLOMAX)  0.4 MG CAPS capsule Take 1 capsule (0.4 mg total) by mouth daily.   No current facility-administered medications for this visit. (Other)      REVIEW OF SYSTEMS:    ALLERGIES No Known Allergies  PAST MEDICAL HISTORY Past Medical History:  Diagnosis Date   Aortic valve insufficiency 12/15/2014   Moderate by ECHO 12/15/14 normal LV function    Back pain    Carotid artery occlusion    Cataract    Hyperlipidemia    Hypertension    Impaired fasting blood sugar    Left epiretinal membrane 09/12/2019   This condition is resolved after vitrectomy and membrane peel was performed, 02/06/2019.  Topographic distortion completely released.   Loss of balance    Peripheral vascular disease (HCC)    Smoker    Vitreous hemorrhage of left eye (HCC) 06/10/2019   Past Surgical History:  Procedure Laterality Date   CATARACT EXTRACTION     COLONOSCOPY  07/2020   never    FAMILY HISTORY Family History  Problem Relation Age of Onset   CVA Mother    Other Mother        brain tumor   Other Father        complications from prostate sugery   Cancer Father  prostate   Thyroid disease Neg Hx    Heart disease Neg Hx    Diabetes Neg Hx    Kidney disease Neg Hx    Hypertension Neg Hx    Hyperlipidemia Neg Hx     SOCIAL HISTORY Social History   Tobacco Use   Smoking status: Every Day    Packs/day: 0.25    Years: 32.00    Pack years: 8.00    Types: Cigarettes   Smokeless tobacco: Never  Vaping Use   Vaping Use: Never used  Substance Use Topics   Alcohol use: No   Drug use: No         OPHTHALMIC EXAM:  Base Eye Exam     Visual Acuity (ETDRS)       Right Left   Dist [place] 20/50 HM   Dist ph [place] 20/25 -2          Tonometry (Tonopen, 1:33 PM)       Right Left   Pressure 20 22         Pupils       Pupils Dark Light Shape React APD   Right PERRL 5 5 Round Minimal None   Left PERRL 6 6 Round Minimal None         Visual Fields       Left Right     Full    Restrictions Partial outer superior temporal, inferior temporal, superior nasal, inferior nasal deficiencies          Extraocular Movement       Right Left    Full Full         Neuro/Psych     Oriented x3: Yes   Mood/Affect: Normal         Dilation     Left eye: 1.0% Mydriacyl, 2.5% Phenylephrine @ 1:33 PM           Slit Lamp and Fundus Exam     External Exam       Right Left   External Normal Normal         Slit Lamp Exam       Right Left   Lids/Lashes Normal Normal   Conjunctiva/Sclera White and quiet White and quiet   Cornea Clear Clear   Anterior Chamber Deep and quiet Deep, quiet   Iris Round and reactive Neovascularization, old and from ocular ischemia   Lens 2+ Nuclear sclerosis Posterior chamber intraocular lens, Open posterior capsule   Anterior Vitreous Normal Vitreous hemorrhage, no cells         Fundus Exam       Right Left   Posterior Vitreous Normal clear avitric   Disc Normal 3+ Pallor, headlight and a fog   C/D Ratio 0.45 0.85   Macula Normal Microaneurysms   Vessels Normal Old central retinal artery occlusion with clear evidence of retinal nonperfusion peripherally.,  No neovascularization today posteriorly   Periphery Normal Good PRP, 360,            IMAGING AND PROCEDURES  Imaging and Procedures for 01/12/21  OCT, Retina - OU - Both Eyes       Right Eye Quality was good. Scan locations included subfoveal. Central Foveal Thickness: 245. Progression has been stable. Findings include normal foveal contour.   Left Eye Quality was poor. Scan locations included juxtafoveal. Progression has been stable. Findings include abnormal foveal contour, outer retinal atrophy, central retinal atrophy.   Notes OS with diffuse retinal atrophy now with clear media  post intravitreal Avastin to control the indolent ongoing ocular ischemic induced neovascularization of the anterior segment     Intravitreal Injection, Pharmacologic  Agent - OS - Left Eye       Time Out 01/12/2021. 1:49 PM. Confirmed correct patient, procedure, site, and patient consented.   Anesthesia Topical anesthesia was used. Anesthetic medications included [place] 3.5%.   Procedure Preparation included 10% betadine to eyelids, 5% betadine to ocular surface, Ofloxacin . A 30 gauge needle was used.   Injection: 2.5 mg bevacizumab 2.5 MG/0.1ML   Route: Intravitreal, Site: Left Eye   NDC: [phone], Lot: [id number]   Post-op Post injection exam found visual acuity of at least counting fingers. The patient tolerated the procedure well. There were no complications. The patient received written and verbal post procedure care education. Post injection medications were not given.              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  We will control fundamentally the retinal ischemia with intravitreal Avastin, needs follow-up visit with Dr. Alben Spittle and/or Dr. Elmer Picker regarding intraocular pressure control      ICD-10-CM   1. 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 Ocular ischemic syndrome  H35.82     3. Nuclear sclerotic cataract of right  eye  H25.11     4. Hyphema, left eye  H21.02     5. Rubeosis iridis of left eye  H21.1X2     6. Secondary glaucoma, left, moderate stage  H40.52X2       1.  Repeat injection intravitreal Avastin OS today to control the neovascular and macular edema side effects of retinal vein occlusion combined with central artery occlusion in the past.  Trying to control the effects of ocular ischemic syndrome.  This also may prevent recurrent hemorrhages of progression neovascularization currently at 13-week follow-up.  2.  Repeat examination next in 11 to 12 weeks.  3.  Ophthalmic Meds Ordered this visit:  Meds ordered this encounter  Medications   bevacizumab (AVASTIN) SOSY 2.5 mg       Return in about 11 weeks (around 03/30/2021) for dilate, OS, AVASTIN OCT.  There are no Patient Instructions on file for this visit.   Explained the diagnoses, plan, and follow up with the patient and they expressed understanding.  Patient expressed understanding of the importance of proper follow up care.   Alford Highland Kamil Mchaffie M.D. Diseases & Surgery of the Retina and Vitreous [facility] 01/12/21     Abbreviations: M myopia (nearsighted); A astigmatism; H hyperopia (farsighted); P presbyopia; Mrx spectacle prescription;  CTL contact lenses; OD right eye; OS left eye; OU both eyes  XT exotropia; ET esotropia; PEK punctate epithelial keratitis; PEE punctate epithelial erosions; DES dry eye syndrome; MGD  meibomian gland dysfunction; ATs artificial tears; PFAT's preservative free artificial tears; NSC nuclear sclerotic cataract; PSC posterior subcapsular cataract; ERM epi-retinal membrane; PVD posterior vitreous detachment; RD retinal detachment; DM diabetes mellitus; DR diabetic retinopathy; NPDR non-proliferative diabetic retinopathy; PDR proliferative diabetic retinopathy; CSME clinically significant macular edema; DME diabetic macular edema; dbh dot blot hemorrhages; CWS cotton wool spot;  POAG primary open angle glaucoma; C/D cup-to-disc ratio; HVF humphrey visual field; GVF goldmann visual field; OCT optical coherence tomography; IOP intraocular pressure; BRVO Branch retinal vein occlusion; CRVO central retinal vein occlusion; CRAO central retinal artery occlusion; BRAO branch retinal artery occlusion; RT retinal tear; SB scleral buckle; PPV pars plana vitrectomy; VH Vitreous hemorrhage; PRP panretinal laser photocoagulation; IVK intravitreal kenalog; VMT vitreomacular traction; MH Macular hole;  NVD neovascularization of the disc; NVE neovascularization elsewhere; AREDS age related eye disease study; ARMD age related macular degeneration; POAG primary open angle glaucoma; EBMD epithelial/anterior basement membrane dystrophy; ACIOL anterior chamber intraocular lens; IOL intraocular lens; PCIOL posterior chamber intraocular lens; Phaco/IOL phacoemulsification with intraocular lens placement; PRK photorefractive keratectomy; LASIK laser assisted in situ keratomileusis; HTN hypertension; DM diabetes mellitus; COPD chronic obstructive pulmonary disease

## 2021-01-12 NOTE — Assessment & Plan Note (Signed)
Chronic and still less active will need to control with periodic maintenance injection Avastin

## 2021-01-12 NOTE — Assessment & Plan Note (Signed)
Underlying disorder OS secondary to 100% internal carotid artery (ICA) occlusion ipsilateral diagnosed 2020

## 2021-01-12 NOTE — Assessment & Plan Note (Signed)
Follow-up with [facility] eye care

## 2021-01-12 NOTE — Assessment & Plan Note (Signed)
No recurrences secondary to ongoing ocular ischemic syndrome once patient was placed on periodic maintenance intravitreal Avastin now at 66-month follow-up.

## 2021-01-12 NOTE — Assessment & Plan Note (Signed)
Likely secondary to a combination of neovascularization neovascular glaucoma superimposed upon underlying ocular ischemia.  We will control fundamentally the retinal ischemia with intravitreal Avastin, needs follow-up visit with Dr. Alben Spittle and/or Dr. Elmer Picker regarding intraocular pressure control

## 2021-02-10 ENCOUNTER — Other Ambulatory Visit (INDEPENDENT_AMBULATORY_CARE_PROVIDER_SITE_OTHER): Payer: Self-pay | Admitting: Ophthalmology

## 2021-02-11 ENCOUNTER — Other Ambulatory Visit (INDEPENDENT_AMBULATORY_CARE_PROVIDER_SITE_OTHER): Payer: Self-pay | Admitting: Ophthalmology

## 2021-02-11 MED ORDER — BRIMONIDINE TARTRATE 0.15 % OP SOLN: 1.0000 [drp] | Freq: Two times a day (BID) | OPHTHALMIC | 0 refills | Status: DC

## 2021-02-12 ENCOUNTER — Encounter (INDEPENDENT_AMBULATORY_CARE_PROVIDER_SITE_OTHER): Payer: Self-pay

## 2021-02-12 LAB — HM DIABETES EYE EXAM

## 2021-02-26 ENCOUNTER — Encounter: Payer: Self-pay | Admitting: Internal Medicine

## 2021-02-27 ENCOUNTER — Other Ambulatory Visit (INDEPENDENT_AMBULATORY_CARE_PROVIDER_SITE_OTHER): Payer: Self-pay | Admitting: Ophthalmology

## 2021-03-08 ENCOUNTER — Other Ambulatory Visit (INDEPENDENT_AMBULATORY_CARE_PROVIDER_SITE_OTHER): Payer: Self-pay | Admitting: Ophthalmology

## 2021-03-09 ENCOUNTER — Other Ambulatory Visit (INDEPENDENT_AMBULATORY_CARE_PROVIDER_SITE_OTHER): Payer: Self-pay | Admitting: Ophthalmology

## 2021-03-09 MED ORDER — TIMOLOL MALEATE 0.5 % OP SOLN: 1.0000 [drp] | Freq: Every day | OPHTHALMIC | 3 refills | Status: DC

## 2021-03-10 ENCOUNTER — Other Ambulatory Visit (INDEPENDENT_AMBULATORY_CARE_PROVIDER_SITE_OTHER): Payer: Self-pay | Admitting: Ophthalmology

## 2021-03-30 ENCOUNTER — Encounter (INDEPENDENT_AMBULATORY_CARE_PROVIDER_SITE_OTHER): Payer: 59 | Admitting: Ophthalmology

## 2021-04-01 ENCOUNTER — Other Ambulatory Visit (INDEPENDENT_AMBULATORY_CARE_PROVIDER_SITE_OTHER): Payer: Self-pay | Admitting: Ophthalmology

## 2021-04-06 ENCOUNTER — Encounter (INDEPENDENT_AMBULATORY_CARE_PROVIDER_SITE_OTHER): Payer: 59 | Admitting: Ophthalmology

## 2021-04-12 ENCOUNTER — Ambulatory Visit (INDEPENDENT_AMBULATORY_CARE_PROVIDER_SITE_OTHER): Payer: 59 | Admitting: Ophthalmology

## 2021-04-12 ENCOUNTER — Other Ambulatory Visit: Payer: Self-pay

## 2021-04-12 ENCOUNTER — Encounter (INDEPENDENT_AMBULATORY_CARE_PROVIDER_SITE_OTHER): Payer: Self-pay | Admitting: Ophthalmology

## 2021-04-12 DIAGNOSIS — H4052X2 Glaucoma secondary to other eye disorders, left eye, moderate stage: Secondary | ICD-10-CM

## 2021-04-12 DIAGNOSIS — H34812 Central retinal vein occlusion, left eye, with macular edema: Secondary | ICD-10-CM

## 2021-04-12 DIAGNOSIS — H2102 Hyphema, left eye: Secondary | ICD-10-CM

## 2021-04-12 DIAGNOSIS — H3582 Retinal ischemia: Secondary | ICD-10-CM

## 2021-04-12 DIAGNOSIS — H2511 Age-related nuclear cataract, right eye: Secondary | ICD-10-CM

## 2021-04-12 DIAGNOSIS — H3412 Central retinal artery occlusion, left eye: Secondary | ICD-10-CM | POA: Diagnosis not present

## 2021-04-12 MED ORDER — BEVACIZUMAB 2.5 MG/0.1ML IZ SOSY
2.5000 mg | PREFILLED_SYRINGE | INTRAVITREAL | Status: AC | PRN
  Administered 2021-04-12: 2.5 mg via INTRAVITREAL

## 2021-04-12 NOTE — Assessment & Plan Note (Signed)
OD follow-up with Dr. Zenaida Niece of [facility] eye care as scheduled

## 2021-04-12 NOTE — Assessment & Plan Note (Signed)
Secondary glaucoma left eye well-controlled as residual previous neovascular glaucoma, continue on medications

## 2021-04-12 NOTE — Assessment & Plan Note (Signed)
Accounts for large neovascular trunks in the anterior segment, iris and recurrences of hemorrhages in the vitreous and iris and anterior chamber, controlled with quarterly injections antivegF

## 2021-04-12 NOTE — Assessment & Plan Note (Signed)
Chronic stable CME macular nonperfusion left eye with high risk of neovascular disease secondary to ischemic, with multiple recurrences of hyphema and vitreous hemorrhage.  Well-controlled now on quarterly injections antivegF, Avastin OS

## 2021-04-12 NOTE — Assessment & Plan Note (Signed)
Accounts for underlying ocular ischemia associated with ipsilateral total internal carotid occlusion thus ocular blood supply is from the contralateral side of the CNS perfusion

## 2021-04-12 NOTE — Assessment & Plan Note (Addendum)
History of recurrences in the past when the eye was untreated for late onset complications of neovascular disease from combined central retinal artery and central vein occlusion left eye in the past, with avascular retina

## 2021-04-12 NOTE — Progress Notes (Signed)
04/12/2021     CHIEF COMPLAINT Patient presents for  Chief Complaint  Patient presents with   Retina Follow Up      HISTORY OF PRESENT ILLNESS: Terry Gross is a 55 y.o. male who presents to the clinic today for:   HPI     Retina Follow Up   Patient presents with  CRVO/BRVO.  In left eye.  This started 11 weeks ago.  Severity is mild.  Duration of 11 weeks.  Since onset it is stable.        Comments   11 WEEK FU os OCT AVASTIN os. Patient states vision is stable and unchanged since last visit. Denies any new floaters or FOL.       Last edited by Nelva Nay on 04/12/2021  3:01 PM.      Referring physician: Diona Foley, MD [address] [place],  [place] [address]  HISTORICAL INFORMATION:   Selected notes from the MEDICAL RECORD NUMBER    Lab Results  Component Value Date   HGBA1C 5.9 (H) 07/22/2020     CURRENT MEDICATIONS: Current Outpatient Medications (Ophthalmic Drugs)  Medication Sig   brimonidine (ALPHAGAN) 0.15 % ophthalmic solution Place 1 drop into the left eye 2 (two) times daily.   latanoprost (XALATAN) 0.005 % ophthalmic solution INSTILL 1 DROP IN LEFT EYE NIGHTLY   timolol (TIMOPTIC) 0.5 % ophthalmic solution Please specify directions, refills and quantity   timolol (TIMOPTIC) 0.5 % ophthalmic solution Please specify directions, refills and quantity   No current facility-administered medications for this visit. (Ophthalmic Drugs)   Current Outpatient Medications (Other)  Medication Sig   clopidogrel (PLAVIX) 75 MG tablet Take 1 tablet (75 mg total) by mouth daily.   nicotine polacrilex (NICORETTE) 2 MG gum Take 1 each (2 mg total) by mouth as needed for smoking cessation.   Omega-3 Fatty Acids (FISH OIL) 1200 MG CAPS Take by mouth daily.   pantoprazole (PROTONIX) 40 MG tablet Take 1 tablet (40 mg total) by mouth daily. (Patient not taking: Reported on 12/03/2020)   rosuvastatin (CRESTOR) 40 MG tablet Take 1 tablet (40 mg  total) by mouth daily.   tamsulosin (FLOMAX) 0.4 MG CAPS capsule Take 1 capsule (0.4 mg total) by mouth daily.   No current facility-administered medications for this visit. (Other)      REVIEW OF SYSTEMS:    ALLERGIES No Known Allergies  PAST MEDICAL HISTORY Past Medical History:  Diagnosis Date   Aortic valve insufficiency 12/15/2014   Moderate by ECHO 12/15/14 normal LV function    Back pain    Carotid artery occlusion    Cataract    Hyperlipidemia    Hypertension    Impaired fasting blood sugar    Left epiretinal membrane 09/12/2019   This condition is resolved after vitrectomy and membrane peel was performed, 02/06/2019.  Topographic distortion completely released.   Loss of balance    Peripheral vascular disease (HCC)    Smoker    Vitreous hemorrhage of left eye (HCC) 06/10/2019   Past Surgical History:  Procedure Laterality Date   CATARACT EXTRACTION     COLONOSCOPY  07/2020   never    FAMILY HISTORY Family History  Problem Relation Age of Onset   CVA Mother    Other Mother        brain tumor   Other Father        complications from prostate sugery   Cancer Father        prostate  Thyroid disease Neg Hx    Heart disease Neg Hx    Diabetes Neg Hx    Kidney disease Neg Hx    Hypertension Neg Hx    Hyperlipidemia Neg Hx     SOCIAL HISTORY Social History   Tobacco Use   Smoking status: Every Day    Packs/day: 0.25    Years: 32.00    Pack years: 8.00    Types: Cigarettes   Smokeless tobacco: Never  Vaping Use   Vaping Use: Never used  Substance Use Topics   Alcohol use: No   Drug use: No         OPHTHALMIC EXAM:  Base Eye Exam     Visual Acuity (ETDRS)       Right Left   Dist [place] 20/20 -1 HM   Dist ph [place]  NI         Tonometry (Tonopen, 3:04 PM)       Right Left   Pressure 15 17         Pupils       Dark Light Shape React   Right 5 4 Round Brisk   Left 6 6  Minimal         Extraocular Movement       Right Left     Full Full         Neuro/Psych     Oriented x3: Yes   Mood/Affect: Normal         Dilation     Left eye: 1.0% Mydriacyl, 2.5% Phenylephrine @ 3:04 PM           Slit Lamp and Fundus Exam     External Exam       Right Left   External Normal Normal         Slit Lamp Exam       Right Left   Lids/Lashes Normal Normal   Conjunctiva/Sclera White and quiet White and quiet   Cornea Clear Clear   Anterior Chamber Deep and quiet Deep, quiet   Iris Round and reactive Neovascularization, old and from ocular ischemia   Lens 2+ Nuclear sclerosis Posterior chamber intraocular lens, Open posterior capsule   Anterior Vitreous Normal Vitreous hemorrhage, no cells         Fundus Exam       Right Left   Posterior Vitreous  clear avitric   Disc  3+ Pallor, clear reviewed today   C/D Ratio  0.85   Macula  Microaneurysms   Vessels  Old central retinal artery occlusion with clear evidence of retinal nonperfusion peripherally.,  No neovascularization today posteriorly   Periphery  Good PRP, 360,            IMAGING AND PROCEDURES  Imaging and Procedures for 04/12/21  Intravitreal Injection, Pharmacologic Agent - OS - Left Eye       Time Out 04/12/2021. 3:23 PM. Confirmed correct patient, procedure, site, and patient consented.   Anesthesia Topical anesthesia was used. Anesthetic medications included Lidocaine 4%.   Procedure Preparation included 10% betadine to eyelids, 5% betadine to ocular surface, Ofloxacin . A 30 gauge needle was used.   Injection: 2.5 mg bevacizumab 2.5 MG/0.1ML   Route: Intravitreal, Site: Left Eye   NDC: [phone], Lot: [id number]   Post-op Post injection exam found visual acuity of at least counting fingers. The patient tolerated the procedure well. There were no complications. The patient received written and verbal post procedure care education. Post injection medications included  ocuflox.      OCT, Retina - OU - Both Eyes        Right Eye Quality was good. Scan locations included subfoveal. Central Foveal Thickness: 242. Progression has been stable. Findings include normal foveal contour.   Left Eye Quality was poor. Scan locations included juxtafoveal. Progression has been stable. Findings include abnormal foveal contour, outer retinal atrophy, central retinal atrophy.   Notes OS with diffuse retinal atrophy now with clear media post intravitreal Avastin to control the indolent ongoing ocular ischemic induced neovascularization of the anterior segment             ASSESSMENT/PLAN:  Secondary glaucoma, left, moderate stage Secondary glaucoma left eye well-controlled as residual previous neovascular glaucoma, continue on medications  Hyphema, left eye History of recurrences in the past when the eye was untreated for late onset complications of neovascular disease from combined central retinal artery and central vein occlusion left eye in the past, with avascular retina  Central retinal vein occlusion, left eye, with macular edema Chronic stable CME macular nonperfusion left eye with high risk of neovascular disease secondary to ischemic, with multiple recurrences of hyphema and vitreous hemorrhage.  Well-controlled now on quarterly injections antivegF, Avastin OS  Central retinal artery occlusion of left eye Accounts for underlying ocular ischemia associated with ipsilateral total internal carotid occlusion thus ocular blood supply is from the contralateral side of the CNS perfusion  Ocular ischemic syndrome Accounts for large neovascular trunks in the anterior segment, iris and recurrences of hemorrhages in the vitreous and iris and anterior chamber, controlled with quarterly injections antivegF  Nuclear sclerotic cataract of right eye OD follow-up with Dr. Zenaida Niece of [facility] eye care as scheduled     ICD-10-CM   1. Central retinal vein occlusion with macular edema of left eye  H34.8120 Intravitreal  Injection, Pharmacologic Agent - OS - Left Eye    OCT, Retina - OU - Both Eyes    bevacizumab (AVASTIN) SOSY 2.5 mg    2. Secondary glaucoma, left, moderate stage  H40.52X2     3. Hyphema, left eye  H21.02     4. Central retinal vein occlusion, left eye, with macular edema  H34.8120     5. Central retinal artery occlusion of left eye  H34.12     6. Ocular ischemic syndrome  H35.82     7. Nuclear sclerotic cataract of right eye  H25.11       1.  OS, with chronic ocular ischemic syndrome due to ophthalmic artery occlusion and central retinal vein occlusion in the past.  With underlying etiology being ipsilateral total internal carotid artery occlusion intracranially  2.  OS with history of chronic recurrences of vitreous hemorrhages and anterior chamber hyphema secondary to ocular ischemic syndrome and large neovascular trunks of NVI, controlled on quarterly antivegF to maintain and salvage the globe  3.  Ophthalmic Meds Ordered this visit:  Meds ordered this encounter  Medications   bevacizumab (AVASTIN) SOSY 2.5 mg       Return in about 3 months (around 07/11/2021) for DILATE OU, AVASTIN OCT, OS.  There are no Patient Instructions on file for this visit.   Explained the diagnoses, plan, and follow up with the patient and they expressed understanding.  Patient expressed understanding of the importance of proper follow up care.   Alford Highland Quantavious Eggert M.D. Diseases & Surgery of the Retina and Vitreous [facility] 04/12/21     Abbreviations: M myopia (nearsighted); A astigmatism;  H hyperopia (farsighted); P presbyopia; Mrx spectacle prescription;  CTL contact lenses; OD right eye; OS left eye; OU both eyes  XT exotropia; ET esotropia; PEK punctate epithelial keratitis; PEE punctate epithelial erosions; DES dry eye syndrome; MGD meibomian gland dysfunction; ATs artificial tears; PFAT's preservative free artificial tears; [place] nuclear sclerotic cataract; PSC  posterior subcapsular cataract; ERM epi-retinal membrane; PVD posterior vitreous detachment; RD retinal detachment; DM diabetes mellitus; DR diabetic retinopathy; NPDR non-proliferative diabetic retinopathy; PDR proliferative diabetic retinopathy; CSME clinically significant macular edema; DME diabetic macular edema; dbh dot blot hemorrhages; CWS cotton wool spot; POAG primary open angle glaucoma; C/D cup-to-disc ratio; HVF humphrey visual field; GVF goldmann visual field; OCT optical coherence tomography; IOP intraocular pressure; BRVO Branch retinal vein occlusion; CRVO central retinal vein occlusion; CRAO central retinal artery occlusion; BRAO branch retinal artery occlusion; RT retinal tear; SB scleral buckle; PPV pars plana vitrectomy; VH Vitreous hemorrhage; PRP panretinal laser photocoagulation; IVK intravitreal kenalog; VMT vitreomacular traction; MH Macular hole;  NVD neovascularization of the disc; NVE neovascularization elsewhere; AREDS age related eye disease study; ARMD age related macular degeneration; POAG primary open angle glaucoma; EBMD epithelial/anterior basement membrane dystrophy; ACIOL anterior chamber intraocular lens; IOL intraocular lens; PCIOL posterior chamber intraocular lens; Phaco/IOL phacoemulsification with intraocular lens placement; [place] photorefractive keratectomy; LASIK laser assisted in situ keratomileusis; HTN hypertension; DM diabetes mellitus; COPD chronic obstructive pulmonary disease

## 2021-04-22 ENCOUNTER — Other Ambulatory Visit: Payer: Self-pay

## 2021-04-22 DIAGNOSIS — I6523 Occlusion and stenosis of bilateral carotid arteries: Secondary | ICD-10-CM

## 2021-05-06 ENCOUNTER — Other Ambulatory Visit: Payer: Self-pay

## 2021-05-06 ENCOUNTER — Ambulatory Visit (INDEPENDENT_AMBULATORY_CARE_PROVIDER_SITE_OTHER): Payer: 59 | Admitting: Physician Assistant

## 2021-05-06 ENCOUNTER — Ambulatory Visit (HOSPITAL_COMMUNITY)
Admission: RE | Admit: 2021-05-06 | Discharge: 2021-05-06 | Disposition: A | Discharge status: Home / Self Care | Payer: 59 | Source: Ambulatory Visit | Attending: Physician Assistant | Admitting: Physician Assistant

## 2021-05-06 ENCOUNTER — Encounter: Payer: Self-pay | Admitting: Physician Assistant

## 2021-05-06 VITALS — BP 126/68 | HR 73 | Temp 98.7°F | Resp 20 | Ht 68.0 in | Wt 154.8 lb

## 2021-05-06 DIAGNOSIS — I6523 Occlusion and stenosis of bilateral carotid arteries: Secondary | ICD-10-CM | POA: Diagnosis not present

## 2021-05-06 DIAGNOSIS — I6521 Occlusion and stenosis of right carotid artery: Secondary | ICD-10-CM | POA: Diagnosis not present

## 2021-05-06 NOTE — Progress Notes (Signed)
History of Present Illness:  Patient is a 55 y.o. year old male who presents for evaluation of carotid stenosis.  He has a history of occluded left ICA.  He suffered central retinal artery occlusion possibly at the time of his internal carotid occlusion.   The patient denies new symptoms of TIA, amaurosis, or stroke.    He stays active on a daily basis.  He takes Plavix and Crestor daily for maximum medical care.    Past Medical History:  Diagnosis Date   Aortic valve insufficiency 12/15/2014   Moderate by ECHO 12/15/14 normal LV function    Back pain    Carotid artery occlusion    Cataract    Hyperlipidemia    Hypertension    Impaired fasting blood sugar    Left epiretinal membrane 09/12/2019   This condition is resolved after vitrectomy and membrane peel was performed, 02/06/2019.  Topographic distortion completely released.   Loss of balance    Peripheral vascular disease (HCC)    Smoker    Vitreous hemorrhage of left eye (HCC) 06/10/2019    Past Surgical History:  Procedure Laterality Date   CATARACT EXTRACTION     COLONOSCOPY  07/2020   never     Social History Social History   Tobacco Use   Smoking status: Every Day    Packs/day: 0.25    Years: 32.00    Pack years: 8.00    Types: Cigarettes   Smokeless tobacco: Never  Vaping Use   Vaping Use: Never used  Substance Use Topics   Alcohol use: No   Drug use: No    Family History Family History  Problem Relation Age of Onset   CVA Mother    Other Mother        brain tumor   Other Father        complications from prostate sugery   Cancer Father        prostate   Thyroid disease Neg Hx    Heart disease Neg Hx    Diabetes Neg Hx    Kidney disease Neg Hx    Hypertension Neg Hx    Hyperlipidemia Neg Hx     Allergies  No Known Allergies   Current Outpatient Medications  Medication Sig Dispense Refill   brimonidine (ALPHAGAN) 0.15 % ophthalmic solution Place 1 drop into the left eye 2 (two) times  daily. 10 mL 0   clopidogrel (PLAVIX) 75 MG tablet Take 1 tablet (75 mg total) by mouth daily. 90 tablet 3   latanoprost (XALATAN) 0.005 % ophthalmic solution INSTILL 1 DROP IN LEFT EYE NIGHTLY 2.5 mL 3   nicotine polacrilex (NICORETTE) 2 MG gum Take 1 each (2 mg total) by mouth as needed for smoking cessation. 60 tablet 1   Omega-3 Fatty Acids (FISH OIL) 1200 MG CAPS Take by mouth daily.     pantoprazole (PROTONIX) 40 MG tablet Take 1 tablet (40 mg total) by mouth daily. 90 tablet 3   rosuvastatin (CRESTOR) 40 MG tablet Take 1 tablet (40 mg total) by mouth daily. 90 tablet 3   tamsulosin (FLOMAX) 0.4 MG CAPS capsule Take 1 capsule (0.4 mg total) by mouth daily. 90 capsule 3   timolol (TIMOPTIC) 0.5 % ophthalmic solution Please specify directions, refills and quantity 5 mL 12   timolol (TIMOPTIC) 0.5 % ophthalmic solution Please specify directions, refills and quantity 5 mL 3   No current facility-administered medications for this visit.    ROS:  General:  No weight loss, Fever, chills  HEENT: No recent headaches, no nasal bleeding, no visual changes, no sore throat  Neurologic: No dizziness, blackouts, seizures. No recent symptoms of stroke or mini- stroke. No recent episodes of slurred speech, or temporary blindness.  Cardiac: No recent episodes of chest pain/pressure, no shortness of breath at rest.  No shortness of breath with exertion.  Denies history of atrial fibrillation or irregular heartbeat  Vascular: No history of rest pain in feet.  No history of claudication.  No history of non-healing ulcer, No history of DVT   Pulmonary: No home oxygen, no productive cough, no hemoptysis,  No asthma or wheezing  Musculoskeletal:  [ ]  Arthritis, [ ]  Low back pain,  [ ]  Joint pain  Hematologic:No history of hypercoagulable state.  No history of easy bleeding.  No history of anemia  Gastrointestinal: No hematochezia or melena,  No gastroesophageal reflux, no trouble  swallowing  Urinary: [ ]  chronic Kidney disease, [ ]  on HD - [ ]  MWF or [ ]  TTHS, [ ]  Burning with urination, [ ]  Frequent urination, [ ]  Difficulty urinating;   Skin: No rashes  Psychological: No history of anxiety,  No history of depression   Physical Examination  Vitals:   05/06/21 1431 05/06/21 1434  BP: 126/72 126/68  Pulse: 73   Resp: 20   Temp: 98.7 F (37.1 C)   TempSrc: Temporal   SpO2: 96%   Weight: 154 lb 12.8 oz (70.2 kg)   Height: 5\' 8"  (1.727 m)     Body mass index is 23.54 kg/m.  General:  Alert and oriented, no acute distress HEENT: Normal Neck: No bruit or JVD Pulmonary: Clear to auscultation bilaterally Cardiac: Regular Rate and Rhythm without murmur Gastrointestinal: Soft, non-tender, non-distended, no mass, no scars Skin: No rash 2+ radial pulses bilaterally Musculoskeletal: No deformity or edema  Neurologic: Upper and lower extremity motor 5/5 and symmetric  DATA:       Right Carotid Findings:  +----------+--------+--------+--------+-------------------------+--------+              PSV cm/s EDV cm/s Stenosis Plaque Description        Comments   +----------+--------+--------+--------+-------------------------+--------+   CCA Prox   99       13                                                     +----------+--------+--------+--------+-------------------------+--------+   CCA Mid    92       19                                                     +----------+--------+--------+--------+-------------------------+--------+   CCA Distal 68       17                heterogenous                         +----------+--------+--------+--------+-------------------------+--------+   ICA Prox   221      69       60-79%   heterogenous and calcific            +----------+--------+--------+--------+-------------------------+--------+  ICA Mid    88       31                                                      +----------+--------+--------+--------+-------------------------+--------+   ICA Distal 95       31                                                     +----------+--------+--------+--------+-------------------------+--------+   ECA        93       12                                                     +----------+--------+--------+--------+-------------------------+--------+   +----------+--------+-------+----------------+-------------------+              PSV cm/s EDV cms Describe         Arm Pressure (mmHG)   +----------+--------+-------+----------------+-------------------+   Subclavian 165              Multiphasic, WNL                       +----------+--------+-------+----------------+-------------------+   +---------+--------+--+--------+--+---------+   Vertebral PSV cm/s 72 EDV cm/s 14 Antegrade   +---------+--------+--+--------+--+---------+       Left Carotid Findings:  +----------+--------+--------+--------+------------------+--------+              PSV cm/s EDV cm/s Stenosis Plaque Description Comments   +----------+--------+--------+--------+------------------+--------+   CCA Prox   87       11                                              +----------+--------+--------+--------+------------------+--------+   CCA Mid    65       10                                              +----------+--------+--------+--------+------------------+--------+   CCA Distal 47       10                                              +----------+--------+--------+--------+------------------+--------+   ICA Prox                     Occluded                               +----------+--------+--------+--------+------------------+--------+   ICA Mid                      Occluded                               +----------+--------+--------+--------+------------------+--------+  ICA Distal                   Occluded                                +----------+--------+--------+--------+------------------+--------+   ECA        75       14                                              +----------+--------+--------+--------+------------------+--------+   +----------+--------+--------+----------------+-------------------+              PSV cm/s EDV cm/s Describe         Arm Pressure (mmHG)   +----------+--------+--------+----------------+-------------------+   Subclavian 91                Multiphasic, WNL                       +----------+--------+--------+----------------+-------------------+   +---------+--------+--+--------+--+---------+   Vertebral PSV cm/s 75 EDV cm/s 20 Antegrade   +---------+--------+--+--------+--+---------+      Summary:  Right Carotid: Velocities in the right ICA are consistent with a 60-79%                 stenosis.                 Velocities may be overestimated due to comppensatory flow.   Left Carotid: Evidence consistent with a total occlusion of the left ICA.   Vertebrals:  Bilateral vertebral arteries demonstrate antegrade flow.  Subclavians: Normal flow hemodynamics were seen in bilateral subclavian               arteries  ASSESSMENT/PLAN: He has moved from 40-59 % up to the 60-79% stenosis category.  He is at the lower range for stenosis.  He is worried about this change.  He remains asymptomatic.  I will bring him back in 6 months for repeat carotid duplex since he has an occluded left ICA.    Continue Plavix and Statin daily with activity as tolerates.  If he develops symptoms of stroke he will call 911.      Mosetta Pigeon PA-C [facility] Office: [phone]  MD on call Myra Gianotti

## 2021-05-25 ENCOUNTER — Telehealth: Payer: Self-pay | Admitting: Internal Medicine

## 2021-05-25 NOTE — Telephone Encounter (Signed)
Tried to call pt to see if hes had a flu shot or wants one or declines so it can be documented

## 2021-07-12 ENCOUNTER — Encounter (INDEPENDENT_AMBULATORY_CARE_PROVIDER_SITE_OTHER): Payer: 59 | Admitting: Ophthalmology

## 2021-07-19 ENCOUNTER — Other Ambulatory Visit: Payer: Self-pay

## 2021-07-19 ENCOUNTER — Ambulatory Visit (INDEPENDENT_AMBULATORY_CARE_PROVIDER_SITE_OTHER): Payer: 59 | Admitting: Ophthalmology

## 2021-07-19 DIAGNOSIS — H2511 Age-related nuclear cataract, right eye: Secondary | ICD-10-CM | POA: Diagnosis not present

## 2021-07-19 DIAGNOSIS — H3582 Retinal ischemia: Secondary | ICD-10-CM

## 2021-07-19 DIAGNOSIS — H2102 Hyphema, left eye: Secondary | ICD-10-CM

## 2021-07-19 DIAGNOSIS — H3412 Central retinal artery occlusion, left eye: Secondary | ICD-10-CM

## 2021-07-19 DIAGNOSIS — H3522 Other non-diabetic proliferative retinopathy, left eye: Secondary | ICD-10-CM

## 2021-07-19 DIAGNOSIS — H34812 Central retinal vein occlusion, left eye, with macular edema: Secondary | ICD-10-CM

## 2021-07-19 MED ORDER — BEVACIZUMAB 2.5 MG/0.1ML IZ SOSY
2.5000 mg | PREFILLED_SYRINGE | INTRAVITREAL | Status: AC | PRN
  Administered 2021-07-19: 2.5 mg via INTRAVITREAL

## 2021-07-19 MED ORDER — TIMOLOL MALEATE 0.5 % OP SOLN: OPHTHALMIC | 12 refills | Status: DC

## 2021-07-19 NOTE — Assessment & Plan Note (Signed)
History of occlusion of the past accounts for acuity ?

## 2021-07-19 NOTE — Progress Notes (Signed)
07/19/2021     CHIEF COMPLAINT Patient presents for  Chief Complaint  Patient presents with   Central Retinal Vein Occlusion    And a history of ocular ischemia left eye with secondary neovascularization of the iris and posteriorly with vitreous hemorrhage  HISTORY OF PRESENT ILLNESS: Terry Gross is a 56 y.o. male who presents to the clinic today for:   HPI   3 mos for dilate OU AVASTIN OCT OS. Pt states no changes in medical history and vision. Pt denies floaters and FOL. Pt is currently taking timolol, brimonidine and latanoprost. Pt is taking brimonidine 2x a day and latanoprost once at night. Pt wanted to ask Dr. Zadie Rhine why his prescription eye drops dosage changed. Pharmacist informed pt that it is okay however, pt feels that the dosage is too strong.  Last edited by Silvestre Moment on 07/19/2021  1:10 PM.      Referring physician: Carlena Hurl, PA-C [place],  [place] [address]  HISTORICAL INFORMATION:   Selected notes from the MEDICAL RECORD NUMBER    Lab Results  Component Value Date   HGBA1C 5.9 (H) 07/22/2020     CURRENT MEDICATIONS: Current Outpatient Medications (Ophthalmic Drugs)  Medication Sig   brimonidine (ALPHAGAN) 0.15 % ophthalmic solution Place 1 drop into the left eye 2 (two) times daily.   latanoprost (XALATAN) 0.005 % ophthalmic solution INSTILL 1 DROP IN LEFT EYE NIGHTLY   timolol (TIMOPTIC) 0.5 % ophthalmic solution 1 drop left eye once daily   No current facility-administered medications for this visit. (Ophthalmic Drugs)   Current Outpatient Medications (Other)  Medication Sig   clopidogrel (PLAVIX) 75 MG tablet Take 1 tablet (75 mg total) by mouth daily.   nicotine polacrilex (NICORETTE) 2 MG gum Take 1 each (2 mg total) by mouth as needed for smoking cessation.   Omega-3 Fatty Acids (FISH OIL) 1200 MG CAPS Take by mouth daily.   pantoprazole (PROTONIX) 40 MG tablet Take 1 tablet (40 mg total) by mouth daily.    rosuvastatin (CRESTOR) 40 MG tablet Take 1 tablet (40 mg total) by mouth daily.   tamsulosin (FLOMAX) 0.4 MG CAPS capsule Take 1 capsule (0.4 mg total) by mouth daily.   No current facility-administered medications for this visit. (Other)      REVIEW OF SYSTEMS: ROS   Negative for: Constitutional, Gastrointestinal, Neurological, Skin, Genitourinary, Musculoskeletal, HENT, Endocrine, Cardiovascular, Eyes, Respiratory, Psychiatric, Allergic/Imm, Heme/Lymph Last edited by Silvestre Moment on 07/19/2021  1:10 PM.       ALLERGIES No Known Allergies  PAST MEDICAL HISTORY Past Medical History:  Diagnosis Date   Aortic valve insufficiency 12/15/2014   Moderate by ECHO 12/15/14 normal LV function    Back pain    Carotid artery occlusion    Cataract    Hyperlipidemia    Hypertension    Impaired fasting blood sugar    Left epiretinal membrane 09/12/2019   This condition is resolved after vitrectomy and membrane peel was performed, 02/06/2019.  Topographic distortion completely released.   Loss of balance    Peripheral vascular disease (HCC)    Smoker    Vitreous hemorrhage of left eye ([place]) 06/10/2019   Past Surgical History:  Procedure Laterality Date   CATARACT EXTRACTION     COLONOSCOPY  07/2020   never    FAMILY HISTORY Family History  Problem Relation Age of Onset   CVA Mother    Other Mother        brain tumor  Other Father        complications from prostate sugery   Cancer Father        prostate   Thyroid disease Neg Hx    Heart disease Neg Hx    Diabetes Neg Hx    Kidney disease Neg Hx    Hypertension Neg Hx    Hyperlipidemia Neg Hx     SOCIAL HISTORY Social History   Tobacco Use   Smoking status: Every Day    Packs/day: 0.25    Years: 32.00    Pack years: 8.00    Types: Cigarettes   Smokeless tobacco: Never  Vaping Use   Vaping Use: Never used  Substance Use Topics   Alcohol use: No   Drug use: No         OPHTHALMIC EXAM:  Base Eye Exam     Visual  Acuity (ETDRS)       Right Left   Dist [place] 20/40 HM   Dist ph [place] 20/30          Tonometry (Tonopen, 1:17 PM)       Right Left   Pressure 15 14         Pupils       Dark Light Shape React APD   Right 5 4 Round Brisk None   Left 6 6 Round Minimal None         Visual Fields       Left Right    Full Full         Extraocular Movement       Right Left    Full Full         Neuro/Psych     Oriented x3: Yes   Mood/Affect: Normal         Dilation     Both eyes: 1.0% Mydriacyl @ 1:17 PM           Slit Lamp and Fundus Exam     External Exam       Right Left   External Normal Normal         Slit Lamp Exam       Right Left   Lids/Lashes Normal Normal   Conjunctiva/Sclera White and quiet White and quiet   Cornea Clear Clear   Anterior Chamber Deep and quiet Deep, quiet   Iris Round and reactive Neovascularization, old and from ocular ischemia   Lens 2+ Nuclear sclerosis Posterior chamber intraocular lens, Open posterior capsule   Anterior Vitreous Normal Vitreous hemorrhage, no cells         Fundus Exam       Right Left   Posterior Vitreous  clear avitric   Disc  3+ Pallor, clear reviewed today   C/D Ratio  0.85   Macula  Microaneurysms   Vessels  Old central retinal artery occlusion with clear evidence of retinal nonperfusion peripherally.,  No neovascularization today posteriorly   Periphery  Good PRP, 360,            IMAGING AND PROCEDURES  Imaging and Procedures for 07/19/21  OCT, Retina - OU - Both Eyes       Right Eye Quality was good. Scan locations included subfoveal. Central Foveal Thickness: 242. Progression has been stable. Findings include normal foveal contour.   Left Eye Quality was poor. Scan locations included juxtafoveal. Progression has been stable. Findings include abnormal foveal contour, outer retinal atrophy, central retinal atrophy.   Notes OS with diffuse retinal atrophy now with  clear media post  intravitreal Avastin to control the indolent ongoing ocular ischemic induced neovascularization of the anterior segment     Intravitreal Injection, Pharmacologic Agent - OS - Left Eye       Time Out 07/19/2021. 1:36 PM. Confirmed correct patient, procedure, site, and patient consented.   Anesthesia Topical anesthesia was used. Anesthetic medications included Lidocaine 4%.   Procedure Preparation included 10% betadine to eyelids, 5% betadine to ocular surface, Ofloxacin . A 30 gauge needle was used.   Injection: 2.5 mg bevacizumab 2.5 MG/0.1ML   Route: Intravitreal, Site: Left Eye   NDC: [phone], Lot: [id number]   Post-op Post injection exam found visual acuity of at least counting fingers. The patient tolerated the procedure well. There were no complications. The patient received written and verbal post procedure care education. Post injection medications included ocuflox.              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 Ocular ischemic syndrome  H35.82     3. Nuclear sclerotic cataract of right eye  H25.11     4. Hyphema, left eye  H21.02     5. Central retinal  artery occlusion of left eye  H34.12     6. Nondiabetic proliferative retinopathy, left  H35.22       1.  Control of hyphema and micro vitreous hemorrhage recurrences left eye secondary to anterior segment ocular ischemia from previous central retinal artery occlusion from proximal large vessel occlusion, OS.  Secondary complications controlled recently with quarterly injections antivegF, Avastin, today at 31-month interval  2.  Repeat Avastin OS today and examination next in 3-1/2 months  3.  Continue on topical Timoptic once daily in the morning OS  Continue on topical latanoprost at bedtime left eye  Continue on brimonidine 0.2% left eye twice daily Ophthalmic Meds Ordered this visit:  Meds ordered this encounter  Medications   bevacizumab (AVASTIN) SOSY 2.5 mg   timolol (TIMOPTIC) 0.5 % ophthalmic solution    Sig: 1 drop left eye once daily    Dispense:  5 mL    Refill:  12       Return in about 15 weeks (around 11/01/2021) for dilate, OS, AVASTIN OCT.  Patient Instructions   Continue on topical Timoptic once daily in the morning OS  Continue on topical latanoprost at bedtime left eye  Continue on brimonidine 0.2% left eye twice daily   Explained the diagnoses, plan, and follow up with the patient and they expressed understanding.  Patient expressed understanding of the importance of proper follow up care.   Clent Demark Pruitt Taboada M.D. Diseases & Surgery of the Retina and Vitreous [facility] [place]  07/19/21     Abbreviations: M myopia (nearsighted); A astigmatism; H hyperopia (farsighted); P presbyopia; Mrx spectacle prescription;  CTL contact lenses; OD right eye; OS left eye; OU both eyes  XT exotropia; ET esotropia; PEK punctate epithelial keratitis; PEE punctate epithelial erosions; DES dry eye syndrome; MGD meibomian gland dysfunction; ATs artificial tears; PFAT's preservative free artificial tears; [place] nuclear sclerotic cataract; PSC posterior subcapsular  cataract; ERM epi-retinal membrane; PVD posterior vitreous detachment; RD retinal detachment; DM diabetes mellitus; DR diabetic retinopathy; NPDR non-proliferative diabetic retinopathy; PDR proliferative diabetic retinopathy; CSME clinically significant macular edema; DME diabetic macular edema; dbh dot blot hemorrhages; CWS cotton wool spot; POAG primary open angle glaucoma; C/D cup-to-disc ratio; HVF humphrey visual field; GVF goldmann visual field; OCT optical coherence tomography; IOP intraocular pressure; BRVO Branch retinal vein occlusion; CRVO central retinal vein occlusion; CRAO central retinal artery occlusion; BRAO branch retinal artery occlusion; RT retinal tear; SB scleral buckle; PPV pars plana vitrectomy; VH Vitreous hemorrhage; PRP panretinal laser photocoagulation; IVK intravitreal kenalog; VMT vitreomacular traction; MH Macular hole;  NVD neovascularization of the disc; NVE neovascularization elsewhere; AREDS age related eye disease study; ARMD age related macular degeneration; POAG primary open angle glaucoma; EBMD epithelial/anterior basement membrane dystrophy; ACIOL anterior chamber intraocular lens; IOL intraocular lens; PCIOL posterior chamber intraocular lens; Phaco/IOL phacoemulsification with intraocular lens placement; [place] photorefractive keratectomy; LASIK laser assisted in situ keratomileusis; HTN hypertension; DM diabetes mellitus; COPD chronic obstructive pulmonary disease

## 2021-07-19 NOTE — Patient Instructions (Signed)
Continue on topical Timoptic once daily in the morning OS ? ?Continue on topical latanoprost at bedtime left eye ? ?Continue on brimonidine 0.2% left eye twice daily ?

## 2021-07-19 NOTE — Assessment & Plan Note (Signed)
Moderate cataract OD today ?

## 2021-07-19 NOTE — Assessment & Plan Note (Signed)
History of hemorrhage in the past, controllable with just periodic quarterly injections of Avastin ?

## 2021-07-19 NOTE — Assessment & Plan Note (Signed)
This condition is stable, accounts for the large neovascularization of the iris which is not going to resolve, this is due to proximal large vessel disease occlusion ischemia and spontaneous bypass around the circle of Willis ?

## 2021-07-19 NOTE — Assessment & Plan Note (Signed)
Good PRP peripherally 

## 2021-07-23 ENCOUNTER — Encounter: Payer: 59 | Admitting: Medical

## 2021-08-10 ENCOUNTER — Other Ambulatory Visit: Payer: Self-pay | Admitting: Medical

## 2021-08-25 ENCOUNTER — Other Ambulatory Visit: Payer: Self-pay | Admitting: Medical

## 2021-09-22 ENCOUNTER — Ambulatory Visit: Payer: 59 | Admitting: Medical

## 2021-09-22 ENCOUNTER — Encounter: Payer: Self-pay | Admitting: Medical

## 2021-09-22 VITALS — BP 110/62 | HR 74 | Ht 68.0 in | Wt 148.2 lb

## 2021-09-22 DIAGNOSIS — I8393 Asymptomatic varicose veins of bilateral lower extremities: Secondary | ICD-10-CM

## 2021-09-22 DIAGNOSIS — R7301 Impaired fasting glucose: Secondary | ICD-10-CM | POA: Diagnosis not present

## 2021-09-22 DIAGNOSIS — H3522 Other non-diabetic proliferative retinopathy, left eye: Secondary | ICD-10-CM

## 2021-09-22 DIAGNOSIS — I878 Other specified disorders of veins: Secondary | ICD-10-CM

## 2021-09-22 DIAGNOSIS — E782 Mixed hyperlipidemia: Secondary | ICD-10-CM | POA: Diagnosis not present

## 2021-09-22 DIAGNOSIS — I6521 Occlusion and stenosis of right carotid artery: Secondary | ICD-10-CM

## 2021-09-22 DIAGNOSIS — N400 Enlarged prostate without lower urinary tract symptoms: Secondary | ICD-10-CM

## 2021-09-22 DIAGNOSIS — L989 Disorder of the skin and subcutaneous tissue, unspecified: Secondary | ICD-10-CM

## 2021-09-22 DIAGNOSIS — Z8042 Family history of malignant neoplasm of prostate: Secondary | ICD-10-CM | POA: Diagnosis not present

## 2021-09-22 DIAGNOSIS — H348121 Central retinal vein occlusion, left eye, with retinal neovascularization: Secondary | ICD-10-CM

## 2021-09-22 DIAGNOSIS — H2102 Hyphema, left eye: Secondary | ICD-10-CM

## 2021-09-22 DIAGNOSIS — Z7185 Encounter for immunization safety counseling: Secondary | ICD-10-CM | POA: Diagnosis not present

## 2021-09-22 DIAGNOSIS — I739 Peripheral vascular disease, unspecified: Secondary | ICD-10-CM

## 2021-09-22 DIAGNOSIS — I351 Nonrheumatic aortic (valve) insufficiency: Secondary | ICD-10-CM

## 2021-09-22 DIAGNOSIS — Z125 Encounter for screening for malignant neoplasm of prostate: Secondary | ICD-10-CM

## 2021-09-22 DIAGNOSIS — Z0000 Encounter for general adult medical examination without abnormal findings: Secondary | ICD-10-CM | POA: Diagnosis not present

## 2021-09-22 DIAGNOSIS — R7989 Other specified abnormal findings of blood chemistry: Secondary | ICD-10-CM

## 2021-09-22 DIAGNOSIS — Z720 Tobacco use: Secondary | ICD-10-CM

## 2021-09-22 DIAGNOSIS — I350 Nonrheumatic aortic (valve) stenosis: Secondary | ICD-10-CM | POA: Diagnosis not present

## 2021-09-22 DIAGNOSIS — J439 Emphysema, unspecified: Secondary | ICD-10-CM

## 2021-09-22 DIAGNOSIS — I700 Atherosclerosis of aorta: Secondary | ICD-10-CM

## 2021-09-22 DIAGNOSIS — Z122 Encounter for screening for malignant neoplasm of respiratory organs: Secondary | ICD-10-CM

## 2021-09-22 DIAGNOSIS — H3582 Retinal ischemia: Secondary | ICD-10-CM

## 2021-09-22 NOTE — Assessment & Plan Note (Signed)
?  I reviewed cardiology notes from July 2022.  The plan is to repeat echocardiogram June 2023.  Mild aortic stenosis and moderate to severe aortic insufficiency stable and clinically asymptomatic as of last year.  He has no new symptoms today. ?

## 2021-09-22 NOTE — Assessment & Plan Note (Signed)
Updated labs today, continue statin ?

## 2021-09-22 NOTE — Assessment & Plan Note (Signed)
Updated labs today.  He avoids sugar.    ?

## 2021-09-22 NOTE — Assessment & Plan Note (Signed)
Plan for repeat chest CT ?

## 2021-09-22 NOTE — Assessment & Plan Note (Signed)
-  Advised smoking cessation

## 2021-09-22 NOTE — Assessment & Plan Note (Signed)
Updated labs today ?

## 2021-09-22 NOTE — Assessment & Plan Note (Addendum)
Continue statin, we discussed doing an AAA ultrasound screening ?

## 2021-09-22 NOTE — Progress Notes (Signed)
Subjective:  ? ?HPI ? Terry Gross is a 56 y.o. male who presents for ?Chief Complaint  ?Patient presents with  ? fasitng cpe  ?  Fasting cpe, no concerns.   ? ? ?Patient Care Team: ?Orvill Coulthard, Leward Quan as PCP - General (Family Medicine) ?Nigel Mormon, MD as Consulting Physician (Cardiology) ?Rankin, Clent Demark, MD as Consulting Physician (Ophthalmology) ?Ulyses Amor, PA-C as Physician Assistant (Physician Assistant), vascular ?Has dentures, doesn't see dentist ?Dr. Curt Jews, vascular surgery ?Dr. Rutherford Guys, ophthalmology ?[facility] dermatology ? ? ?Concerns: ?Fasting for labs today ? ?Still smoking some, not heavy.   ? ?Doing fine in general ? ?Reviewed their medical, surgical, family, social, medication, and allergy history and updated chart as appropriate. ? ?Past Medical History:  ?Diagnosis Date  ? Aortic valve insufficiency 12/15/2014  ? Moderate by ECHO 12/15/14 normal LV function   ? Back pain   ? Carotid artery occlusion   ? Cataract   ? Hyperlipidemia   ? Hypertension   ? Impaired fasting blood sugar   ? Left epiretinal membrane 09/12/2019  ? This condition is resolved after vitrectomy and membrane peel was performed, 02/06/2019.  Topographic distortion completely released.  ? Loss of balance   ? Peripheral vascular disease ([place])   ? Smoker   ? Vitreous hemorrhage of left eye ([place]) 06/10/2019  ? ? ?Past Surgical History:  ?Procedure Laterality Date  ? CATARACT EXTRACTION    ? COLONOSCOPY  07/2020  ? never  ? ? ?Family History  ?Problem Relation Age of Onset  ? CVA Mother   ? Other Mother   ?     brain tumor  ? Other Father   ?     complications from prostate sugery  ? Cancer Father   ?     prostate  ? Thyroid disease Neg Hx   ? Heart disease Neg Hx   ? Diabetes Neg Hx   ? Kidney disease Neg Hx   ? Hypertension Neg Hx   ? Hyperlipidemia Neg Hx   ? ? ? ?Current Outpatient Medications:  ?  brimonidine (ALPHAGAN) 0.15 % ophthalmic solution, Place 1 drop into the left eye 2 (two) times daily.,  Disp: 10 mL, Rfl: 0 ?  clopidogrel (PLAVIX) 75 MG tablet, TAKE 1 TABLET BY MOUTH EVERY DAY, Disp: 150 tablet, Rfl: 0 ?  latanoprost (XALATAN) 0.005 % ophthalmic solution, INSTILL 1 DROP IN LEFT EYE NIGHTLY, Disp: 2.5 mL, Rfl: 3 ?  Omega-3 Fatty Acids (FISH OIL) 1200 MG CAPS, Take by mouth daily., Disp: , Rfl:  ?  rosuvastatin (CRESTOR) 40 MG tablet, TAKE 1 TABLET BY MOUTH EVERY DAY, Disp: 120 tablet, Rfl: 0 ?  tamsulosin (FLOMAX) 0.4 MG CAPS capsule, TAKE 1 CAPSULE BY MOUTH 3 DAYS PER WEEK, Disp: 210 capsule, Rfl: 0 ?  timolol (TIMOPTIC) 0.5 % ophthalmic solution, 1 drop left eye once daily, Disp: 5 mL, Rfl: 12 ? ?No Known Allergies ? ? ? ?Review of Systems ?Constitutional: -fever, -chills, -sweats, -unexpected weight change, -decreased appetite, -fatigue ?Allergy: -sneezing, -itching, -congestion ?Dermatology: -changing moles, --rash, -lumps ?ENT: -runny nose, -ear pain, -sore throat, -hoarseness, -sinus pain, -teeth pain, - ringing in ears, -hearing loss, -nosebleeds ?Cardiology: -chest pain, -palpitations, -swelling, -difficulty breathing when lying flat, -waking up short of breath ?Respiratory: -cough, -shortness of breath, -difficulty breathing with exercise or exertion, -wheezing, -coughing up blood ?Gastroenterology: -abdominal pain, -nausea, -vomiting, -diarrhea, -constipation, -blood in stool, -changes in bowel movement, -difficulty swallowing or eating ?Hematology: -bleeding, -bruising  ?  Musculoskeletal: -joint aches, -muscle aches, -joint swelling, -back pain, -neck pain, -cramping, -changes in gait ?Ophthalmology: denies vision changes, eye redness, itching, discharge ?Urology: -burning with urination, -difficulty urinating, -blood in urine, -urinary frequency, -urgency, -incontinence ?Neurology: -headache, -weakness, -tingling, -numbness, -memory loss, -falls, -dizziness ?Psychology: -depressed mood, -agitation, -sleep problems ?Male GU: no testicular mass, pain, no lymph nodes swollen, no swelling, no  rash. ? ?   ?Objective:  ?BP 110/62   Pulse 74   Ht 5\' 8"  (1.727 m)   Wt 148 lb 3.2 oz (67.2 kg)   BMI 22.53 kg/m?   ? ?BP Readings from Last 3 Encounters:  ?09/22/21 110/62  ?05/06/21 126/68  ?12/03/20 122/72  ? ?Wt Readings from Last 3 Encounters:  ?09/22/21 148 lb 3.2 oz (67.2 kg)  ?05/06/21 154 lb 12.8 oz (70.2 kg)  ?12/03/20 148 lb (67.1 kg)  ? ? ?General appearance: alert, no distress, WD/WN, Caucasian male ?Skin: Majority of bilateral anterior lower legs with patchy discoloration including some hypopigmented areas as well as hemosiderin staining consistent with venous stasis disease, left upper back with 65mm flat brown macule with darker colored center, right leg just superior to knee with brown 75mm x 41mm slight raied maculopapular lesion, no other worrisome lesions ?HEENT: normocephalic, conjunctiva/corneas normal, sclerae anicteric, PERRLA, EOMi, nares patent, no discharge or erythema, pharynx normal ?Oral cavity: MMM, tongue normal, edentulous ?Neck: supple, no lymphadenopathy, no thyromegaly, no masses, normal ROM, left carotid bruit ?Chest: non tender, normal shape and expansion ?Heart: 2/6 holosystolic murmur in upper sternal borders,  RRR, normal S1, S2, no murmurs ?Lungs: CTA bilaterally, no wheezes, rhonchi, or rales ?Abdomen: +bs, soft, non tender, non distended, no masses, no hepatomegaly, no splenomegaly, no bruits ?Back: non tender, normal ROM, no scoliosis ?Musculoskeletal: upper extremities non tender, no obvious deformity, normal ROM throughout, lower extremities non tender, no obvious deformity, normal ROM throughout ?Extremities: no edema, no cyanosis, no clubbing ?Pulses: 2+ symmetric, upper  extremities, normal cap refill, decreased pulses in extremities chronically consistent with vascular disease ?Neurological: alert, oriented x 3, CN2-12 intact, strength normal upper extremities and lower extremities, sensation normal throughout, DTRs 2+ throughout, no cerebellar signs, gait  normal ?Psychiatric: normal affect, behavior normal, pleasant  ?GU: normal male external genitalia,circumcised, nontender, no masses, no hernia, no lymphadenopathy ?Rectal: declined ? ? ?Assessment and Plan :  ? ?Encounter Diagnoses  ?Name Primary?  ? Encounter for health maintenance examination in adult Yes  ? Family history of prostate cancer in father   ? Impaired fasting blood sugar   ? Mixed hyperlipidemia   ? Moderate aortic stenosis   ? Moderate aortic regurgitation   ? Aortic atherosclerosis ([place])   ? Benign prostatic hyperplasia, unspecified whether lower urinary tract symptoms present   ? Venous stasis   ? Varicose veins of both lower extremities, unspecified whether complicated   ? Vaccine counseling   ? Tobacco abuse   ? Stenosis of right carotid artery   ? Screening for prostate cancer   ? PVD (peripheral vascular disease) ([place])   ? Pulmonary emphysema, unspecified emphysema type ([place])   ? Abnormal thyroid blood test   ? Screening for lung cancer   ? Encounter for screening for lung cancer   ? Nondiabetic proliferative retinopathy, left   ? Central retinal vein occlusion, left eye, with retinal neovascularization   ? Skin lesion   ? Ocular ischemic syndrome   ? Hyphema, left eye   ? ? ?Today you had a preventative care visit or wellness visit.  Topics today may have included healthy lifestyle, diet, exercise, preventative care, vaccinations, sick and well care, proper use of emergency dept and after hours care, as well as other concerns.   ? ? ?Recommendations: ?Continue to return yearly for your annual wellness and preventative care visits.  This gives [place] a chance to discuss healthy lifestyle, exercise, vaccinations, review your chart record, and perform screenings where appropriate. ? ?I recommend you see your eye doctor yearly for routine vision care. ? ?I recommend you see your dentist yearly for routine dental care including hygiene visits twice yearly. ? ? ?Vaccination recommendations were  reviewed ?Immunization History  ?Administered Date(s) Administered  ? Influenza,inj,Quad PF,6+ Mos 12/30/2014  ? PFIZER(Purple Top)SARS-COV-2 Vaccination 07/30/2019, 08/20/2019, 04/11/2020  ? PNEUMOCOCCAL CONJUGATE-20 05

## 2021-09-22 NOTE — Assessment & Plan Note (Signed)
Advised to quit smoking.  No current symptoms ?

## 2021-09-22 NOTE — Assessment & Plan Note (Signed)
Per 07/2021 ophthalmology notes, history of hemorrhage in the past, controllable with just periodic quarterly injections of Avastin ?

## 2021-09-22 NOTE — Assessment & Plan Note (Signed)
No recent concerns. 

## 2021-09-22 NOTE — Assessment & Plan Note (Signed)
History of left internal carotid occlusion, right internal carotid moderate stenosis, continue Plavix and statin, continue routine follow-up with vascular surgery ?

## 2021-09-22 NOTE — Assessment & Plan Note (Signed)
Continue follow-up with vascular surgery.  I reviewed the December 2022 ultrasound showing significant stenosis ?

## 2021-09-22 NOTE — Assessment & Plan Note (Addendum)
I reviewed most recent ophthalmology notes 07/2021 showing that this condition is stable, accounts for the large neovascularization of the iris which is not going to resolve, this is due to proximal large vessel disease occlusion ischemia and spontaneous bypass around the circle of Willis ?

## 2021-09-22 NOTE — Assessment & Plan Note (Signed)
He continues on Flomax without concern ?

## 2021-09-23 ENCOUNTER — Other Ambulatory Visit: Payer: Self-pay | Admitting: Medical

## 2021-09-23 DIAGNOSIS — R7989 Other specified abnormal findings of blood chemistry: Secondary | ICD-10-CM

## 2021-09-23 LAB — LIPID PANEL
Chol/HDL Ratio: 2.7 ratio (ref 0.0–5.0)
Cholesterol, Total: 128 mg/dL (ref 100–199)
HDL: 47 mg/dL (ref >39)
LDL Chol Calc (NIH): 67 mg/dL (ref 0–99)
Triglycerides: 69 mg/dL (ref 0–149)
VLDL Cholesterol Cal: 14 mg/dL (ref 5–40)

## 2021-09-23 LAB — CBC WITH DIFFERENTIAL/PLATELET
Basophils Absolute: 0 10*3/uL (ref 0.0–0.2)
Basos: 0 %
EOS (ABSOLUTE): 0.2 10*3/uL (ref 0.0–0.4)
Eos: 2 %
Hematocrit: 43.2 % (ref 37.5–51.0)
Hemoglobin: 14.8 g/dL (ref 13.0–17.7)
Immature Grans (Abs): 0 10*3/uL (ref 0.0–0.1)
Immature Granulocytes: 0 %
Lymphocytes Absolute: 2.9 10*3/uL (ref 0.7–3.1)
Lymphs: 41 %
MCH: 31.8 pg (ref 26.6–33.0)
MCHC: 34.3 g/dL (ref 31.5–35.7)
MCV: 93 fL (ref 79–97)
Monocytes Absolute: 0.6 10*3/uL (ref 0.1–0.9)
Monocytes: 8 %
Neutrophils Absolute: 3.5 10*3/uL (ref 1.4–7.0)
Neutrophils: 49 %
Platelets: 239 10*3/uL (ref 150–450)
RBC: 4.65 x10E6/uL (ref 4.14–5.80)
RDW: 13.4 % (ref 11.6–15.4)
WBC: 7.2 10*3/uL (ref 3.4–10.8)

## 2021-09-23 LAB — TSH+FREE T4
Free T4: 1.49 ng/dL (ref 0.82–1.77)
TSH: 0.414 u[IU]/mL — ABNORMAL LOW (ref 0.450–4.500)

## 2021-09-23 LAB — COMPREHENSIVE METABOLIC PANEL
ALT: 14 IU/L (ref 0–44)
AST: 20 IU/L (ref 0–40)
Albumin/Globulin Ratio: 1.6 (ref 1.2–2.2)
Albumin: 4.6 g/dL (ref 3.8–4.9)
Alkaline Phosphatase: 56 IU/L (ref 44–121)
BUN/Creatinine Ratio: 13 (ref 9–20)
BUN: 10 mg/dL (ref 6–24)
Bilirubin Total: 0.5 mg/dL (ref 0.0–1.2)
CO2: 25 mmol/L (ref 20–29)
Calcium: 9.2 mg/dL (ref 8.7–10.2)
Chloride: 103 mmol/L (ref 96–106)
Creatinine, Ser: 0.79 mg/dL (ref 0.76–1.27)
Globulin, Total: 2.9 g/dL (ref 1.5–4.5)
Glucose: 96 mg/dL (ref 70–99)
Potassium: 4.4 mmol/L (ref 3.5–5.2)
Sodium: 142 mmol/L (ref 134–144)
Total Protein: 7.5 g/dL (ref 6.0–8.5)
eGFR: 103 mL/min/{1.73_m2} (ref >59)

## 2021-09-23 LAB — HEMOGLOBIN A1C
Est. average glucose Bld gHb Est-mCnc: 117 mg/dL
Hgb A1c MFr Bld: 5.7 % — ABNORMAL HIGH (ref 4.8–5.6)

## 2021-09-23 LAB — PSA: Prostate Specific Ag, Serum: 0.3 ng/mL (ref 0.0–4.0)

## 2021-09-23 MED ORDER — ROSUVASTATIN CALCIUM 40 MG PO TABS: 40.0000 mg | ORAL_TABLET | Freq: Every day | ORAL | 3 refills | Status: DC

## 2021-09-23 MED ORDER — CLOPIDOGREL BISULFATE 75 MG PO TABS: 75.0000 mg | ORAL_TABLET | Freq: Every day | ORAL | 3 refills | Status: DC

## 2021-09-23 MED ORDER — TAMSULOSIN HCL 0.4 MG PO CAPS: ORAL_CAPSULE | ORAL | 1 refills | Status: DC

## 2021-11-01 ENCOUNTER — Encounter (INDEPENDENT_AMBULATORY_CARE_PROVIDER_SITE_OTHER): Payer: 59 | Admitting: Ophthalmology

## 2021-11-04 ENCOUNTER — Ambulatory Visit (INDEPENDENT_AMBULATORY_CARE_PROVIDER_SITE_OTHER): Payer: 59 | Admitting: Ophthalmology

## 2021-11-04 ENCOUNTER — Encounter (INDEPENDENT_AMBULATORY_CARE_PROVIDER_SITE_OTHER): Payer: Self-pay | Admitting: Ophthalmology

## 2021-11-04 DIAGNOSIS — H348121 Central retinal vein occlusion, left eye, with retinal neovascularization: Secondary | ICD-10-CM | POA: Diagnosis not present

## 2021-11-04 DIAGNOSIS — H3582 Retinal ischemia: Secondary | ICD-10-CM

## 2021-11-04 NOTE — Assessment & Plan Note (Signed)
If condition worsens next, will consider therapy again with antivegF to prevent neovascular ingrowth

## 2021-11-04 NOTE — Assessment & Plan Note (Signed)
Causative etiology was CRAO for ocular ischemic syndrome we will not treat today and follow-up for worsening.

## 2021-11-04 NOTE — Progress Notes (Signed)
11/04/2021     CHIEF COMPLAINT Patient presents for  Chief Complaint  Patient presents with   Retina Evaluation      HISTORY OF PRESENT ILLNESS: Terry Gross is a 56 y.o. male who presents to the clinic today for:   HPI     Retina Evaluation           Laterality: left eye         Comments   OS with a history of combined central retinal artery and central retinal vein occlusion of left eye leading to ischemic ophthalmic artery disease with ocular ischemic syndrome.  Significant anterior segment neovascularization has remained in state remained stable.  Has been seen every 3 months now for several visits and has remained stable.  Causative etiology is ipsilateral total occlusion of the carotid artery with poor perfusion except contralateral perfusion from the contralateral side through the circle of Willis.  Acuity no otherwise no change.  No discomfort experience.      Last edited by Edmon Crape, MD on 11/04/2021  4:09 PM.      Referring physician: Jac Canavan, PA-C [address] [place],  [place] [address]  HISTORICAL INFORMATION:   Selected notes from the MEDICAL RECORD NUMBER    Lab Results  Component Value Date   HGBA1C 5.7 (H) 09/22/2021     CURRENT MEDICATIONS: Current Outpatient Medications (Ophthalmic Drugs)  Medication Sig   brimonidine (ALPHAGAN) 0.15 % ophthalmic solution Place 1 drop into the left eye 2 (two) times daily.   latanoprost (XALATAN) 0.005 % ophthalmic solution INSTILL 1 DROP IN LEFT EYE NIGHTLY   timolol (TIMOPTIC) 0.5 % ophthalmic solution 1 drop left eye once daily   No current facility-administered medications for this visit. (Ophthalmic Drugs)   Current Outpatient Medications (Other)  Medication Sig   clopidogrel (PLAVIX) 75 MG tablet Take 1 tablet (75 mg total) by mouth daily.   Omega-3 Fatty Acids (FISH OIL) 1200 MG CAPS Take by mouth daily.   rosuvastatin (CRESTOR) 40 MG tablet Take 1 tablet (40 mg total)  by mouth daily.   tamsulosin (FLOMAX) 0.4 MG CAPS capsule TAKE 1 CAPSULE BY MOUTH 3 DAYS PER WEEK   No current facility-administered medications for this visit. (Other)      REVIEW OF SYSTEMS: ROS   Negative for: Constitutional, Gastrointestinal, Neurological, Skin, Genitourinary, Musculoskeletal, HENT, Endocrine, Cardiovascular, Eyes, Respiratory, Psychiatric, Allergic/Imm, Heme/Lymph Last edited by Edmon Crape, MD on 11/04/2021  4:02 PM.       ALLERGIES No Known Allergies  PAST MEDICAL HISTORY Past Medical History:  Diagnosis Date   Aortic valve insufficiency 12/15/2014   Moderate by ECHO 12/15/14 normal LV function    Back pain    Carotid artery occlusion    Cataract    Hyperlipidemia    Hypertension    Impaired fasting blood sugar    Left epiretinal membrane 09/12/2019   This condition is resolved after vitrectomy and membrane peel was performed, 02/06/2019.  Topographic distortion completely released.   Loss of balance    Peripheral vascular disease ([facility])    Smoker    Vitreous hemorrhage of left eye ([facility]) 06/10/2019   Past Surgical History:  Procedure Laterality Date   CATARACT EXTRACTION     COLONOSCOPY  07/2020   never    FAMILY HISTORY Family History  Problem Relation Age of Onset   CVA Mother    Other Mother        brain tumor   Other Father  complications from prostate sugery   Cancer Father        prostate   Thyroid disease Neg Hx    Heart disease Neg Hx    Diabetes Neg Hx    Kidney disease Neg Hx    Hypertension Neg Hx    Hyperlipidemia Neg Hx     SOCIAL HISTORY Social History   Tobacco Use   Smoking status: Every Day    Packs/day: 0.25    Years: 32.00    Total pack years: 8.00    Types: Cigarettes   Smokeless tobacco: Never  Vaping Use   Vaping Use: Never used  Substance Use Topics   Alcohol use: No   Drug use: No         OPHTHALMIC EXAM:  Base Eye Exam     Visual Acuity (ETDRS)       Right Left   Dist [place] 20/30 +2  HM         Tonometry (Tonopen, 4:06 PM)       Right Left   Pressure 20 21         Pupils       APD   Right None   Left +3         Visual Fields       Left Right   Restrictions Partial inner superior temporal, inferior temporal, superior nasal, inferior nasal deficiencies          Extraocular Movement       Right Left    Full, Ortho Full, Ortho         Neuro/Psych     Oriented x3: Yes   Mood/Affect: Normal           Slit Lamp and Fundus Exam     External Exam       Right Left   External Normal Normal         Slit Lamp Exam       Right Left   Lids/Lashes Normal Normal   Conjunctiva/Sclera White and quiet White and quiet   Cornea Clear Clear   Anterior Chamber Deep and quiet Deep, quiet   Iris Round and reactive Neovascularization, old and from ocular ischemia   Lens 2+ Nuclear sclerosis Posterior chamber intraocular lens, Open posterior capsule   Anterior Vitreous Normal clear            IMAGING AND PROCEDURES  Imaging and Procedures for 11/04/21           ASSESSMENT/PLAN:  Central retinal vein occlusion, left eye, with retinal neovascularization Causative etiology was CRAO for ocular ischemic syndrome we will not treat today and follow-up for worsening.  Ocular ischemic syndrome If condition worsens next, will consider therapy again with antivegF to prevent neovascular ingrowth      ICD-10-CM   1. Central retinal vein occlusion, left eye, with retinal neovascularization  H34.8121     2. Ocular ischemic syndrome  H35.82       1.  OS no hope for improved acuity.  We have been using intravitreal Avastin simply to stabilize the globe and prevent progression and recurrences of vitreous hemorrhage from severe ocular ischemic syndrome.  Multiple occasions patient has tolerated that in the injections but also now with hand motion vision we will attempt to determine if the condition is stabilized such that we can observe and  not ongoing injections.  2.  Follow-up at shorter interval in 2 months and if no worsening or no recurrence of hemorrhage and  no discomfort, will continue to observe  3.  Patient to continue on topical medications as prescribed Ophthalmic Meds Ordered this visit:  No orders of the defined types were placed in this encounter.      Return in about 6 weeks (around 12/16/2021) for dilate, OS, COLOR FP.  There are no Patient Instructions on file for this visit.   Explained the diagnoses, plan, and follow up with the patient and they expressed understanding.  Patient expressed understanding of the importance of proper follow up care.   Alford Highland Letti Towell M.D. Diseases & Surgery of the Retina and Vitreous [facility] 11/04/21     Abbreviations: M myopia (nearsighted); A astigmatism; H hyperopia (farsighted); P presbyopia; Mrx spectacle prescription;  CTL contact lenses; OD right eye; OS left eye; OU both eyes  XT exotropia; ET esotropia; PEK punctate epithelial keratitis; PEE punctate epithelial erosions; DES dry eye syndrome; MGD meibomian gland dysfunction; ATs artificial tears; PFAT's preservative free artificial tears; NSC nuclear sclerotic cataract; PSC posterior subcapsular cataract; ERM epi-retinal membrane; PVD posterior vitreous detachment; RD retinal detachment; DM diabetes mellitus; DR diabetic retinopathy; NPDR non-proliferative diabetic retinopathy; PDR proliferative diabetic retinopathy; CSME clinically significant macular edema; DME diabetic macular edema; dbh dot blot hemorrhages; CWS cotton wool spot; POAG primary open angle glaucoma; C/D cup-to-disc ratio; HVF humphrey visual field; GVF goldmann visual field; OCT optical coherence tomography; IOP intraocular pressure; BRVO Branch retinal vein occlusion; CRVO central retinal vein occlusion; CRAO central retinal artery occlusion; BRAO branch retinal artery occlusion; RT retinal tear; SB scleral buckle; PPV pars plana  vitrectomy; VH Vitreous hemorrhage; PRP panretinal laser photocoagulation; IVK intravitreal kenalog; VMT vitreomacular traction; MH Macular hole;  NVD neovascularization of the disc; NVE neovascularization elsewhere; AREDS age related eye disease study; ARMD age related macular degeneration; POAG primary open angle glaucoma; EBMD epithelial/anterior basement membrane dystrophy; ACIOL anterior chamber intraocular lens; IOL intraocular lens; PCIOL posterior chamber intraocular lens; Phaco/IOL phacoemulsification with intraocular lens placement; PRK photorefractive keratectomy; LASIK laser assisted in situ keratomileusis; HTN hypertension; DM diabetes mellitus; COPD chronic obstructive pulmonary disease

## 2021-12-22 ENCOUNTER — Ambulatory Visit (INDEPENDENT_AMBULATORY_CARE_PROVIDER_SITE_OTHER): Payer: 59 | Admitting: Ophthalmology

## 2021-12-22 ENCOUNTER — Encounter (INDEPENDENT_AMBULATORY_CARE_PROVIDER_SITE_OTHER): Payer: Self-pay | Admitting: Ophthalmology

## 2021-12-22 DIAGNOSIS — H2102 Hyphema, left eye: Secondary | ICD-10-CM

## 2021-12-22 DIAGNOSIS — H3582 Retinal ischemia: Secondary | ICD-10-CM

## 2021-12-22 DIAGNOSIS — H2511 Age-related nuclear cataract, right eye: Secondary | ICD-10-CM | POA: Diagnosis not present

## 2021-12-22 DIAGNOSIS — H348121 Central retinal vein occlusion, left eye, with retinal neovascularization: Secondary | ICD-10-CM | POA: Diagnosis not present

## 2021-12-22 MED ORDER — TIMOLOL MALEATE 0.5 % OP SOLN: OPHTHALMIC | 12 refills | Status: DC

## 2021-12-22 MED ORDER — BRIMONIDINE TARTRATE 0.15 % OP SOLN: 1.0000 [drp] | Freq: Two times a day (BID) | OPHTHALMIC | 0 refills | Status: AC

## 2021-12-22 NOTE — Assessment & Plan Note (Signed)
OS with ocular ischemic syndrome secondary to 100% occlusion of the internal carotid artery ipsilateral to the left eye.  Only perfusion to the globe or the left eye is through the circle of Willis.  Stable condition light perception vision, no progression of ocular ischemic syndrome uncomfortable mostly.  Ocular pressure controlled.  Good PRP no recurrence of vitreous hemorrhage

## 2021-12-22 NOTE — Assessment & Plan Note (Signed)
Not active at this time 

## 2021-12-22 NOTE — Progress Notes (Signed)
12/22/2021     CHIEF COMPLAINT Patient presents for  Chief Complaint  Patient presents with   Retina Evaluation      HISTORY OF PRESENT ILLNESS: Terry Gross is a 56 y.o. male who presents to the clinic today for:   HPI     Retina Evaluation           Laterality: left eye         Comments   6 weeks dilate os color fp Pt states his vision has been stable Pt denies any floaters or FOL Pt states he feels like he needs to start injections again       Last edited by Edmon Crape, MD on 12/22/2021  4:18 PM.      Referring physician: Jac Canavan, PA-C [id number] [place],  [place] [address]  HISTORICAL INFORMATION:   Selected notes from the MEDICAL RECORD NUMBER    Lab Results  Component Value Date   HGBA1C 5.7 (H) 09/22/2021     CURRENT MEDICATIONS: Current Outpatient Medications (Ophthalmic Drugs)  Medication Sig   brimonidine (ALPHAGAN) 0.15 % ophthalmic solution Place 1 drop into the left eye 2 (two) times daily.   latanoprost (XALATAN) 0.005 % ophthalmic solution INSTILL 1 DROP IN LEFT EYE NIGHTLY   timolol (TIMOPTIC) 0.5 % ophthalmic solution 1 drop left eye once daily   No current facility-administered medications for this visit. (Ophthalmic Drugs)   Current Outpatient Medications (Other)  Medication Sig   clopidogrel (PLAVIX) 75 MG tablet Take 1 tablet (75 mg total) by mouth daily.   Omega-3 Fatty Acids (FISH OIL) 1200 MG CAPS Take by mouth daily.   rosuvastatin (CRESTOR) 40 MG tablet Take 1 tablet (40 mg total) by mouth daily.   tamsulosin (FLOMAX) 0.4 MG CAPS capsule TAKE 1 CAPSULE BY MOUTH 3 DAYS PER WEEK   No current facility-administered medications for this visit. (Other)      REVIEW OF SYSTEMS: ROS   Negative for: Constitutional, Gastrointestinal, Neurological, Skin, Genitourinary, Musculoskeletal, HENT, Endocrine, Cardiovascular, Eyes, Respiratory, Psychiatric, Allergic/Imm, Heme/Lymph Last edited by Erling Cruz  D, CMA on 12/22/2021  3:36 PM.       ALLERGIES No Known Allergies  PAST MEDICAL HISTORY Past Medical History:  Diagnosis Date   Aortic valve insufficiency 12/15/2014   Moderate by ECHO 12/15/14 normal LV function    Back pain    Carotid artery occlusion    Cataract    Hyperlipidemia    Hypertension    Impaired fasting blood sugar    Left epiretinal membrane 09/12/2019   This condition is resolved after vitrectomy and membrane peel was performed, 02/06/2019.  Topographic distortion completely released.   Loss of balance    Peripheral vascular disease (HCC)    Smoker    Vitreous hemorrhage of left eye (HCC) 06/10/2019   Past Surgical History:  Procedure Laterality Date   CATARACT EXTRACTION     COLONOSCOPY  07/2020   never    FAMILY HISTORY Family History  Problem Relation Age of Onset   CVA Mother    Other Mother        brain tumor   Other Father        complications from prostate sugery   Cancer Father        prostate   Thyroid disease Neg Hx    Heart disease Neg Hx    Diabetes Neg Hx    Kidney disease Neg Hx    Hypertension Neg Hx  Hyperlipidemia Neg Hx     SOCIAL HISTORY Social History   Tobacco Use   Smoking status: Every Day    Packs/day: 0.25    Years: 32.00    Total pack years: 8.00    Types: Cigarettes   Smokeless tobacco: Never  Vaping Use   Vaping Use: Never used  Substance Use Topics   Alcohol use: No   Drug use: No         OPHTHALMIC EXAM:  Base Eye Exam     Visual Acuity (ETDRS)       Right Left   Dist Vass 20/50 LP   Dist ph Dickinson 20/20   OS, with bright indirect, LP        Tonometry (Tonopen, 3:42 PM)       Right Left   Pressure 15 8         Pupils       React APD   Right Brisk    Left  +4         Visual Fields       Left Right   Restrictions Total superior temporal, inferior temporal, superior nasal, inferior nasal deficiencies          Extraocular Movement       Right Left    Ortho Ortho    -- -- --   --  --  -- -- --   -- -- --  --  --  -- -- --           Neuro/Psych     Oriented x3: Yes   Mood/Affect: Normal         Dilation     Left eye: 2.5% Phenylephrine, 1.0% Mydriacyl @ 3:37 PM           Slit Lamp and Fundus Exam     External Exam       Right Left   External Normal Normal         Slit Lamp Exam       Right Left   Lids/Lashes Normal Normal   Conjunctiva/Sclera White and quiet White and quiet   Cornea Clear Clear   Anterior Chamber Deep and quiet Deep, quiet   Iris Round and reactive Neovascularization, old and from ocular ischemia   Lens 2+ Nuclear sclerosis Posterior chamber intraocular lens, Open posterior capsule   Anterior Vitreous Normal Vitreous hemorrhage, no cells         Fundus Exam       Right Left   Posterior Vitreous  clear avitric   Disc  3+ Pallor, clear reviewed today   C/D Ratio  0.85   Macula  Microaneurysms   Vessels  Old central retinal artery occlusion with clear evidence of retinal nonperfusion peripherally.,  No neovascularization today posteriorly   Periphery  Good PRP, 360,            IMAGING AND PROCEDURES  Imaging and Procedures for 12/22/21  Color Fundus Photography Optos - OU - Both Eyes       Right Eye Progression has been stable. Disc findings include normal observations. Macula : normal observations. Vessels : normal observations. Periphery : normal observations.   Left Eye Progression has improved. Disc findings include pallor.   Notes OS with optic nerve pallor.  Enlarged cup.  Retinal nonperfusion peripherally.,  Good PRP, no new neovascular complications.  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  Only perfusion to the globe or the left eye is through the circle of Willis.  Stable condition  light perception vision, no progression of ocular ischemic syndrome uncomfortable mostly.  Ocular pressure controlled.  Good PRP no recurrence of vitreous hemorrhage  Nuclear sclerotic cataract of right eye Moderate NSC changes OD  Hyphema, left eye Not active at this time     ICD-10-CM   1. Central retinal vein occlusion, left eye, with retinal neovascularization  H34.8121 Color Fundus Photography Optos - OU - Both Eyes    2. Ocular ischemic syndrome  H35.82     3. Nuclear sclerotic cataract of right eye  H25.11     4. Hyphema, left eye  H21.02       1.  OS doing well.  Ocular ischemic syndrome stable with light perception vision.  No sign of progression, no sign of recurrent vitreous hemorrhages.  2.  OD stable,  3.  OD with moderate NSC changes observe  Ophthalmic Meds Ordered this visit:  Meds ordered this encounter  Medications   timolol (TIMOPTIC) 0.5 % ophthalmic solution    Sig: 1 drop left eye once daily    Dispense:  5 mL    Refill:  12   brimonidine (ALPHAGAN) 0.15 % ophthalmic solution    Sig: Place 1 drop into the left eye 2 (two) times daily.    Dispense:  10 mL    Refill:  0       Return in about 6 months (around 06/24/2022) for DILATE OU, COLOR FP.  There are no Patient Instructions on file for this visit.   Explained the diagnoses, plan, and follow up with the patient and they expressed understanding.  Patient expressed understanding of the importance of proper follow up care.   Clent Demark Ashling Roane M.D. Diseases & Surgery of the Retina and Vitreous [facility] 12/22/21     Abbreviations: M myopia (nearsighted); A astigmatism; H hyperopia (farsighted); P presbyopia; Mrx spectacle prescription;  CTL contact lenses; OD right eye; OS left eye; OU both eyes  XT exotropia; ET esotropia; PEK punctate epithelial keratitis; PEE punctate epithelial erosions; DES dry eye syndrome; MGD meibomian gland dysfunction; ATs artificial tears; PFAT's  preservative free artificial tears; [place]arton nuclear sclerotic cataract; PSC posterior subcapsular cataract; ERM epi-retinal membrane; PVD posterior vitreous detachment; RD retinal detachment; DM diabetes mellitus; DR diabetic retinopathy; NPDR non-proliferative diabetic retinopathy; PDR proliferative diabetic retinopathy; CSME clinically significant macular edema; DME diabetic macular edema; dbh dot blot hemorrhages; CWS cotton wool spot; POAG primary open angle glaucoma; C/D cup-to-disc ratio; HVF humphrey visual field; GVF goldmann visual field; OCT optical coherence tomography; IOP intraocular pressure; BRVO Branch retinal vein occlusion; CRVO central retinal vein occlusion; CRAO central retinal artery occlusion; BRAO branch retinal artery occlusion; RT retinal tear; SB scleral buckle; PPV pars plana vitrectomy; VH Vitreous hemorrhage; PRP panretinal laser photocoagulation; IVK intravitreal kenalog; VMT vitreomacular traction; MH Macular hole;  NVD neovascularization of the disc; NVE neovascularization elsewhere; AREDS age related eye disease study; ARMD age related macular degeneration; POAG primary open angle glaucoma; EBMD epithelial/anterior basement membrane dystrophy; ACIOL anterior chamber intraocular lens; IOL intraocular lens; PCIOL posterior chamber intraocular lens; Phaco/IOL phacoemulsification with intraocular lens placement; Elliott photorefractive keratectomy; LASIK laser assisted in situ keratomileusis; HTN hypertension; DM diabetes mellitus; COPD chronic obstructive pulmonary disease

## 2021-12-22 NOTE — Assessment & Plan Note (Signed)
Moderate NSC changes OD

## 2021-12-30 IMAGING — MR MR MRA HEAD W/O CM
1 series · 20 of 48 positions shown · non-contrast
Comparison: None available.

CLINICAL DATA: Initial evaluation for daily headaches since Friday May, 2019. History of retinal vein occlusion with some loss of vision
in left eye.

EXAM:
MRI HEAD WITHOUT CONTRAST
MRA HEAD WITHOUT CONTRAST
TECHNIQUE: Multiplanar, multiecho pulse sequences of the brain and surrounding
structures were obtained without intravenous contrast. Angiographic
images of the head were obtained using MRA technique without
contrast.

[Series 3: tof_3d_multi-slab new · axial · 0.7mm · 0.35mm/px · z∈[-80,+22]mm · 20 of 156 slices shown]
[im 1/156]
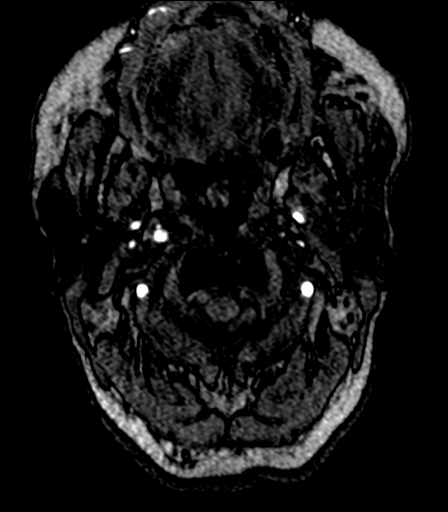
[im 4/156]
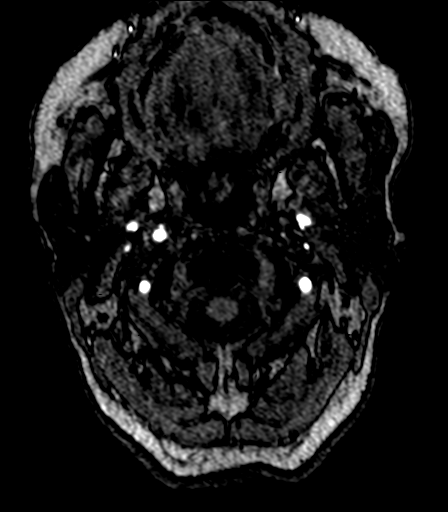
[im 7/156]
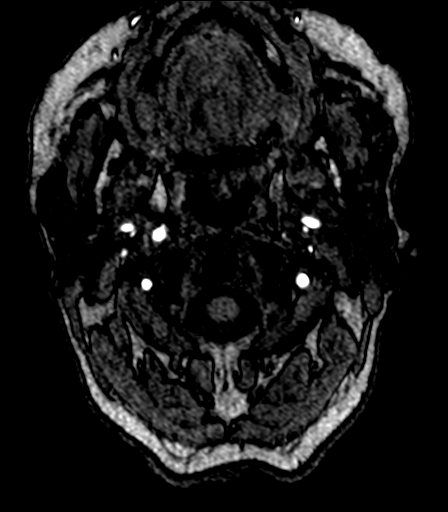
[im 10/156]
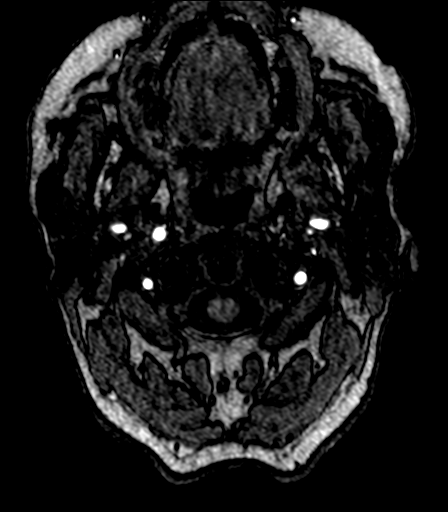
[im 14/156]
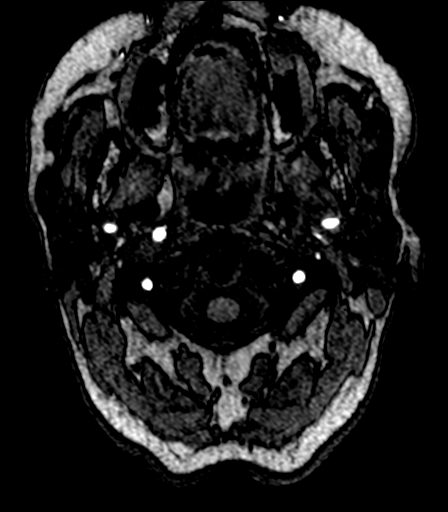
[im 17/156]
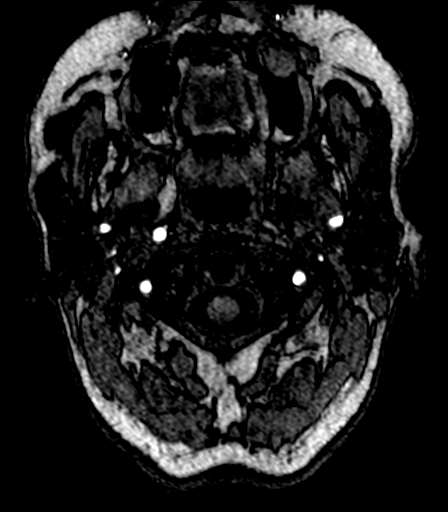
[im 20/156]
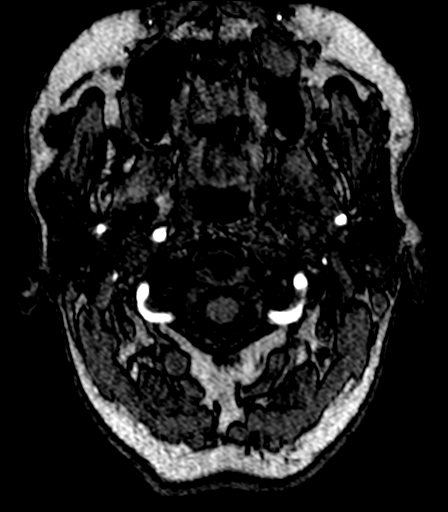
[im 24/156]
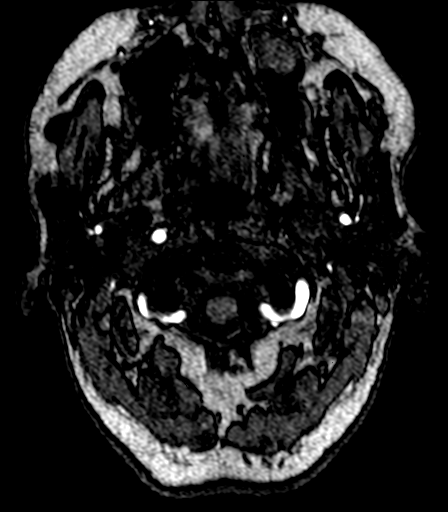
[im 27/156]
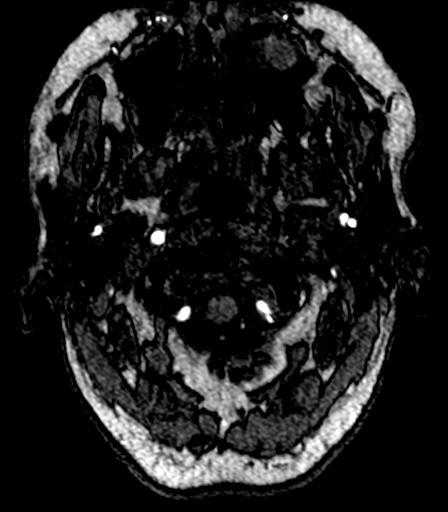
[im 30/156]
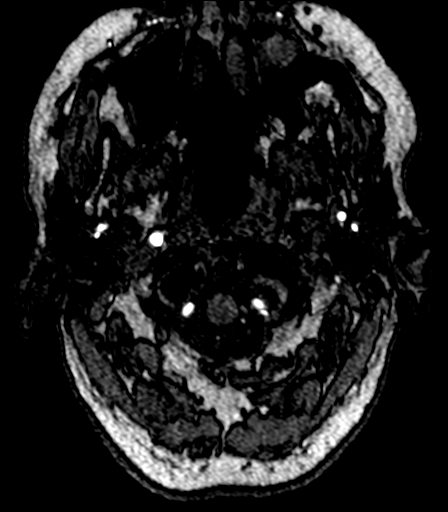
[im 33/156]
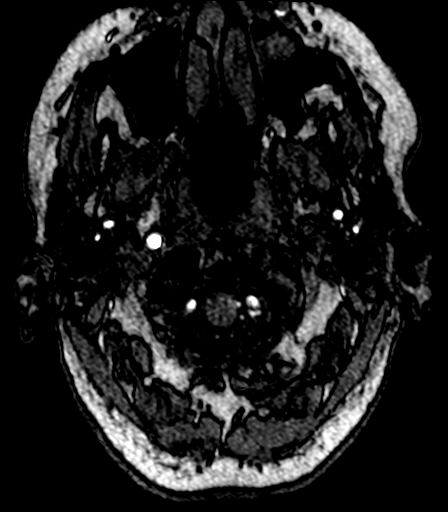
[im 37/156]
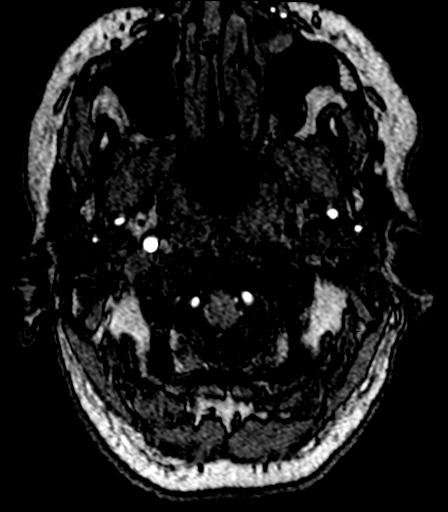
[im 50/156]
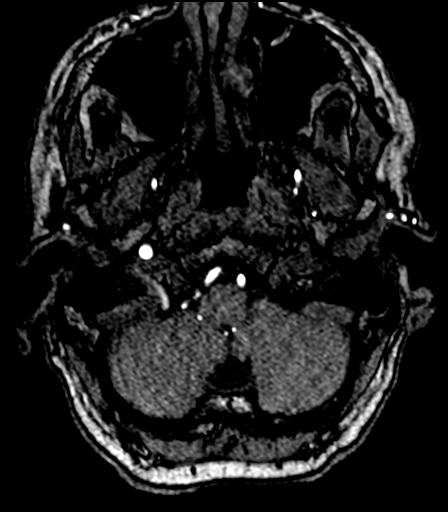
[im 70/156]
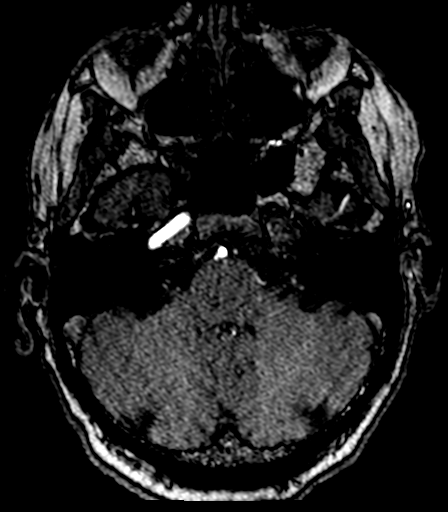
[im 80/156]
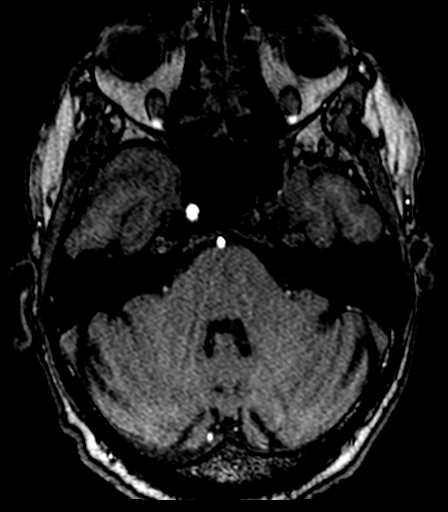
[im 90/156]
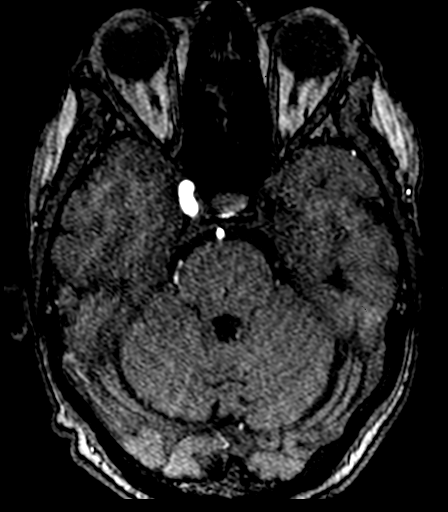
[im 109/156]
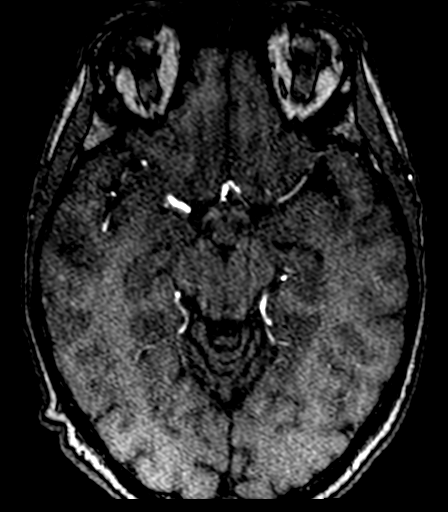
[im 129/156]
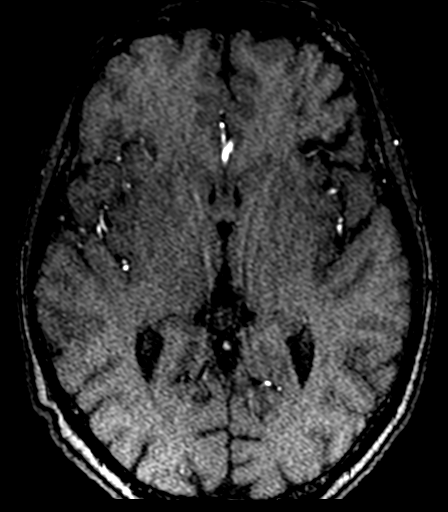
[im 132/156]
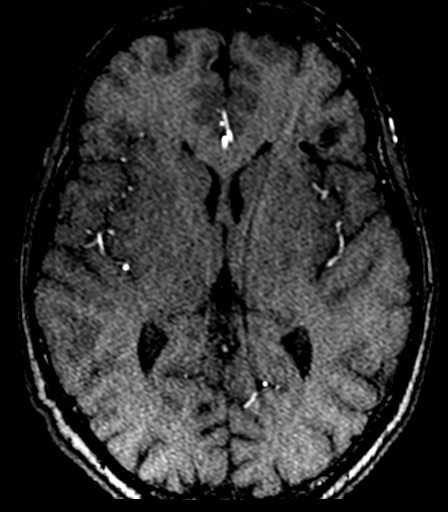
[im 149/156]
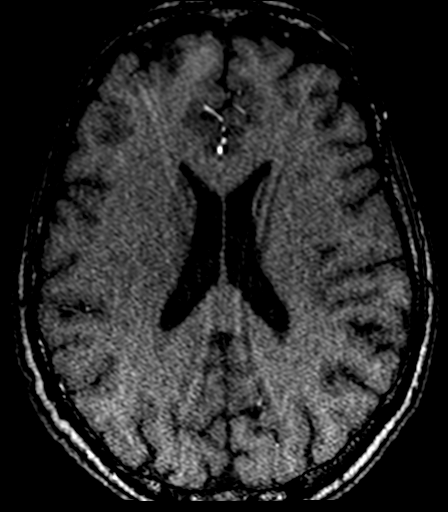

[20 of 48 positions shown; findings below may reference images not displayed]

FINDINGS: MRI HEAD FINDINGS

Brain: Cerebral volume within normal limits for age. No significant
cerebral white matter disease. Small focus of encephalomalacia and
gliosis involving the anterior/inferior left frontal lobe compatible
with a small chronic left MCA territory infarct (series 10, images
16, 15). Associated chronic hemosiderin staining (series 9, image
22). No other evidence for chronic infarction.

No abnormal foci of restricted diffusion to suggest acute or
subacute ischemia. Gray-white matter differentiation maintained. No
acute intracranial hemorrhage. No mass lesion, midline shift or mass
effect. Ventricles normal size without hydrocephalus. No extra-axial
fluid collection. Pituitary gland suprasellar region within normal
limits. Midline structures intact and normal.

Vascular: Loss of normal flow void within the left ICA to the
supraclinoid segment, consistent with occlusion. Major intracranial
vascular flow voids are otherwise maintained.

Skull and upper cervical spine: Craniocervical junction within
normal limits. Bone marrow signal intensity normal. No scalp soft
tissue abnormality.

Sinuses/Orbits: Patient status post ocular lens replacement on the
left. Globes and orbital soft tissues demonstrate no acute finding.
Scattered mucosal thickening within the ethmoidal air cells and
maxillary sinuses, likely inflammatory. Superimposed small retention
cysts within the left maxillary sinus. Mastoid air cells are clear.
Inner ear structures grossly normal.

Other: None.

MRA HEAD FINDINGS

ANTERIOR CIRCULATION:

Distal cervical right ICA widely patent with antegrade flow.
Petrous, cavernous and supraclinoid segments also widely patent
without stenosis. The left ICA is occluded within the neck, and
remains occluded to the ICA terminus. Perfusion of the left
ophthalmic artery via branches of the left external carotid artery
noted.

Right A1 widely patent. Left A1 faintly visible with markedly
attenuated flow on time-of-flight sequence. Normal anterior
communicating artery. Anterior cerebral arteries widely patent to
their distal aspects without stenosis.

Right M1 widely patent. Normal right MCA bifurcation. Distal right
MCA branches well perfused.

Markedly attenuated flow seen within the left M1 segment. Distal
left MCA branches are perfused, although are attenuated as compared
to the contralateral right MCA.

POSTERIOR CIRCULATION:

Both vertebral arteries widely patent to the vertebrobasilar
junction without stenosis. Posterior inferior cerebellar arteries
patent bilaterally. Basilar widely patent to its distal aspect
without stenosis. Superior cerebral arteries patent bilaterally.
Both PCAs primarily supplied via the basilar and are well perfused
to their distal aspects without stenosis.

No intracranial aneurysm or other vascular abnormality.
IMPRESSION: MRI HEAD IMPRESSION:

1. No acute intracranial abnormality.
2. Small chronic left MCA territory infarct involving the anterior
inferior left frontal lobe.
3. Absent flow void within the left ICA, consistent with occlusion
(see below).
4. Mild ethmoidal and maxillary inflammatory sinus disease.

MRA HEAD IMPRESSION:

1. Chronic left ICA occlusion to the level of the terminus. Patent
but markedly attenuated flow within the left MCA distribution via
collateral flow across the circle-of-Willis, and likely from
branches of the left external carotid artery.
2. Otherwise normal intracranial MRA.

## 2022-01-07 ENCOUNTER — Encounter (INDEPENDENT_AMBULATORY_CARE_PROVIDER_SITE_OTHER): Payer: Self-pay

## 2022-01-20 LAB — HM DIABETES EYE EXAM

## 2022-01-25 ENCOUNTER — Encounter: Payer: Self-pay | Admitting: Internal Medicine

## 2022-01-27 ENCOUNTER — Encounter: Payer: Self-pay | Admitting: Medical

## 2022-02-09 ENCOUNTER — Other Ambulatory Visit: Payer: Self-pay | Admitting: Medical

## 2022-02-09 ENCOUNTER — Telehealth: Payer: Self-pay | Admitting: Medical

## 2022-02-09 MED ORDER — ATORVASTATIN CALCIUM 40 MG PO TABS: 40.0000 mg | ORAL_TABLET | Freq: Every day | ORAL | 0 refills | Status: DC

## 2022-02-09 NOTE — Telephone Encounter (Signed)
Pt walked in today and states he lost his insurance and cant afford rosuvastatin but the pharmacy gave him an alternative atorvastatin that is cheaper and he is wondering if he can switch to this medication 90 day supply to same pharmacy.

## 2022-02-15 LAB — HM DIABETES EYE EXAM

## 2022-02-23 ENCOUNTER — Encounter: Payer: Self-pay | Admitting: Internal Medicine

## 2022-03-07 ENCOUNTER — Ambulatory Visit: Payer: Self-pay

## 2022-03-07 ENCOUNTER — Ambulatory Visit (HOSPITAL_COMMUNITY): Payer: Medicaid Other

## 2022-04-22 ENCOUNTER — Ambulatory Visit (INDEPENDENT_AMBULATORY_CARE_PROVIDER_SITE_OTHER): Payer: Medicaid Other | Admitting: Physician Assistant

## 2022-04-22 ENCOUNTER — Ambulatory Visit (HOSPITAL_COMMUNITY)
Admission: RE | Admit: 2022-04-22 | Discharge: 2022-04-22 | Disposition: A | Discharge status: Home / Self Care | Payer: Medicaid Other | Source: Ambulatory Visit | Attending: Physician Assistant | Admitting: Physician Assistant

## 2022-04-22 VITALS — BP 129/80 | HR 79 | Temp 98.3°F | Resp 20 | Ht 68.0 in | Wt 144.7 lb

## 2022-04-22 DIAGNOSIS — I6521 Occlusion and stenosis of right carotid artery: Secondary | ICD-10-CM | POA: Diagnosis not present

## 2022-04-22 DIAGNOSIS — I6523 Occlusion and stenosis of bilateral carotid arteries: Secondary | ICD-10-CM | POA: Insufficient documentation

## 2022-04-22 NOTE — Progress Notes (Signed)
History of Present Illness:  Patient is a 56 y.o. year old male who presents for evaluation of carotid stenosis.  He has know  a history of occluded left ICA.  He suffered central retinal artery occlusion possibly at the time of his internal carotid occlusion.  He is here for surveillance f/u of the right ICA.  Previous duplex demonstrated < 79% EDV was 69.   The patient denies symptoms of TIA, amaurosis in the right eye, or stroke.  He is medically managed on Lipitor and Plavix.  He stays very active by going to the gym multiple times a week.  He continues to use tobacco daily.    Past Medical History:  Diagnosis Date   Aortic valve insufficiency 12/15/2014   Moderate by ECHO 12/15/14 normal LV function    Back pain    Carotid artery occlusion    Cataract    Hyperlipidemia    Hypertension    Impaired fasting blood sugar    Left epiretinal membrane 09/12/2019   This condition is resolved after vitrectomy and membrane peel was performed, 02/06/2019.  Topographic distortion completely released.   Loss of balance    Peripheral vascular disease (HCC)    Smoker    Vitreous hemorrhage of left eye ([place]) 06/10/2019    Past Surgical History:  Procedure Laterality Date   CATARACT EXTRACTION     COLONOSCOPY  07/2020   never     Social History Social History   Tobacco Use   Smoking status: Every Day    Packs/day: 0.25    Years: 32.00    Total pack years: 8.00    Types: Cigarettes    Passive exposure: Never   Smokeless tobacco: Never  Vaping Use   Vaping Use: Never used  Substance Use Topics   Alcohol use: No   Drug use: No    Family History Family History  Problem Relation Age of Onset   CVA Mother    Other Mother        brain tumor   Other Father        complications from prostate sugery   Cancer Father        prostate   Thyroid disease Neg Hx    Heart disease Neg Hx    Diabetes Neg Hx    Kidney disease Neg Hx    Hypertension Neg Hx    Hyperlipidemia Neg Hx      Allergies  No Known Allergies   Current Outpatient Medications  Medication Sig Dispense Refill   atorvastatin (LIPITOR) 40 MG tablet Take 1 tablet (40 mg total) by mouth daily. 90 tablet 0   brimonidine (ALPHAGAN) 0.15 % ophthalmic solution Place 1 drop into the left eye 2 (two) times daily. 10 mL 0   clopidogrel (PLAVIX) 75 MG tablet Take 1 tablet (75 mg total) by mouth daily. 90 tablet 3   latanoprost (XALATAN) 0.005 % ophthalmic solution INSTILL 1 DROP IN LEFT EYE NIGHTLY 2.5 mL 3   Omega-3 Fatty Acids (FISH OIL) 1200 MG CAPS Take by mouth daily.     prednisoLONE acetate (PRED FORTE) 1 % ophthalmic suspension Place 1 drop into the left eye 3 (three) times daily.     tamsulosin (FLOMAX) 0.4 MG CAPS capsule TAKE 1 CAPSULE BY MOUTH 3 DAYS PER WEEK 90 capsule 1   timolol (TIMOPTIC) 0.5 % ophthalmic solution 1 drop left eye once daily 5 mL 12   No current facility-administered medications for this visit.  ROS:   General:  No weight loss, Fever, chills  HEENT: No recent headaches, no nasal bleeding, no visual changes, no sore throat  Neurologic: No dizziness, blackouts, seizures. No recent symptoms of stroke or mini- stroke. No recent episodes of slurred speech, or temporary blindness.  Cardiac: No recent episodes of chest pain/pressure, no shortness of breath at rest.  No shortness of breath with exertion.  Denies history of atrial fibrillation or irregular heartbeat  Vascular: No history of rest pain in feet.  No history of claudication.  No history of non-healing ulcer, No history of DVT   Pulmonary: No home oxygen, no productive cough, no hemoptysis,  No asthma or wheezing  Musculoskeletal:  [ ]  Arthritis, [ ]  Low back pain,  [ ]  Joint pain  Hematologic:No history of hypercoagulable state.  No history of easy bleeding.  No history of anemia  Gastrointestinal: No hematochezia or melena,  No gastroesophageal reflux, no trouble swallowing  Urinary: [ ]  chronic Kidney  disease, [ ]  on HD - [ ]  MWF or [ ]  TTHS, [ ]  Burning with urination, [ ]  Frequent urination, [ ]  Difficulty urinating;   Skin: No rashes  Psychological: No history of anxiety,  No history of depression   Physical Examination  Vitals:   04/22/22 1350 04/22/22 1352  BP: 109/79 129/80  Pulse: 79   Resp: 20   Temp: 98.3 F (36.8 C)   TempSrc: Temporal   SpO2: 98%   Weight: 144 lb 11.2 oz (65.6 kg)   Height: 5\' 8"  (1.727 m)     Body mass index is 22 kg/m.  General:  Alert and oriented, no acute distress HEENT: Normal Neck: No bruit or JVD Pulmonary: Clear to auscultation bilaterally Cardiac: Regular Rate and Rhythm without murmur Gastrointestinal: Soft, non-tender, non-distended, no mass, no scars Skin: No rash Extremity Pulses:  radial pulses bilaterally Musculoskeletal: No deformity or edema  Neurologic: Upper and lower extremity motor 5/5 and symmetric  DATA:  Right Carotid Findings:  +----------+--------+--------+--------+------------------+--------+           PSV cm/sEDV cm/sStenosisPlaque DescriptionComments  +----------+--------+--------+--------+------------------+--------+  CCA Prox  128     15                                          +----------+--------+--------+--------+------------------+--------+  CCA Mid   82      17                                          +----------+--------+--------+--------+------------------+--------+  CCA Distal74      18              heterogenous                +----------+--------+--------+--------+------------------+--------+  ICA Prox  173     58      40-59%  heterogenous      curve     +----------+--------+--------+--------+------------------+--------+  ICA Mid   76      28                                          +----------+--------+--------+--------+------------------+--------+  ICA Distal63      25                                           +----------+--------+--------+--------+------------------+--------+  ECA      118     21                                          +----------+--------+--------+--------+------------------+--------+   +----------+--------+-------+----------------+-------------------+           PSV cm/sEDV cmsDescribe        Arm Pressure (mmHG)  +----------+--------+-------+----------------+-------------------+  Subclavian129    3      Multiphasic, [id number]                  +----------+--------+-------+----------------+-------------------+   +---------+--------+--+--------+--+---------+  VertebralPSV cm/s59EDV cm/s13Antegrade  +---------+--------+--+--------+--+---------+      Left Carotid Findings:  +----------+--------+--------+--------+------------------+--------+           PSV cm/sEDV cm/sStenosisPlaque DescriptionComments  +----------+--------+--------+--------+------------------+--------+  CCA Prox  128     7               heterogenous                +----------+--------+--------+--------+------------------+--------+  CCA Mid   81      12                                          +----------+--------+--------+--------+------------------+--------+  CCA Distal56      10                                          +----------+--------+--------+--------+------------------+--------+  ICA Prox                  Occludedheterogenous                +----------+--------+--------+--------+------------------+--------+  ICA Mid                   Occludedheterogenous                +----------+--------+--------+--------+------------------+--------+  ICA Distal                Occludedheterogenous                +----------+--------+--------+--------+------------------+--------+  ECA      99      26                                          +----------+--------+--------+--------+------------------+--------+    +----------+--------+--------+----------------+-------------------+           PSV cm/sEDV cm/sDescribe        Arm Pressure (mmHG)  +----------+--------+--------+----------------+-------------------+  [id number]     3       Multiphasic, [id number]                  +----------+--------+--------+----------------+-------------------+   +---------+--------+--+--------+--+---------+  VertebralPSV cm/s56EDV cm/s21Antegrade  +---------+--------+--+--------+--+---------+        Summary:  Right Carotid: Velocities in the right ICA are consistent with a 40-59%                 stenosis, may be overestimated due to compensatory flow.   Left Carotid: Evidence consistent with a total occlusion of the left ICA.   Vertebrals:  Bilateral vertebral arteries demonstrate antegrade flow.  Subclavians: Normal flow hemodynamics were seen in bilateral subclavian               arteries.    ASSESSMENT/PLAN: Carotid artery stenosis: a history of occluded left ICA.  He suffered central retinal artery occlusion possibly at the time of his internal carotid occlusion.  He is here for surveillance f/u of the right ICA.  Previous duplex demonstrated < 79% EDV was 69.   The duplex today has improved EDV of 58 which places him the < 59% stenosis category.  He remains asymptomatic for stroke or TIA.    I will have him f/u in 6 months for repeat carotid duplex.  He will continue to exercise as tolerates,  and continue medical  management with Plavix and Stain daily. Stable disposition currently.  If he has symptoms of right brain stroke her will call 911.      Mosetta Pigeon PA-C [facility] Office: [phone] Pager: [phone]

## 2022-05-10 DIAGNOSIS — H4052X3 Glaucoma secondary to other eye disorders, left eye, severe stage: Secondary | ICD-10-CM | POA: Diagnosis not present

## 2022-05-12 ENCOUNTER — Ambulatory Visit: Payer: BC Managed Care – PPO | Admitting: Medical

## 2022-05-12 ENCOUNTER — Encounter: Payer: Self-pay | Admitting: Medical

## 2022-05-12 VITALS — BP 120/60 | HR 72 | Ht 68.0 in | Wt 148.2 lb

## 2022-05-12 DIAGNOSIS — Z23 Encounter for immunization: Secondary | ICD-10-CM

## 2022-05-12 DIAGNOSIS — N400 Enlarged prostate without lower urinary tract symptoms: Secondary | ICD-10-CM | POA: Diagnosis not present

## 2022-05-12 DIAGNOSIS — Z7185 Encounter for immunization safety counseling: Secondary | ICD-10-CM

## 2022-05-12 DIAGNOSIS — H60543 Acute eczematoid otitis externa, bilateral: Secondary | ICD-10-CM | POA: Diagnosis not present

## 2022-05-12 DIAGNOSIS — Z720 Tobacco use: Secondary | ICD-10-CM

## 2022-05-12 DIAGNOSIS — R8290 Unspecified abnormal findings in urine: Secondary | ICD-10-CM

## 2022-05-12 LAB — POCT URINALYSIS DIP (PROADVANTAGE DEVICE)
Bilirubin, UA: NEGATIVE
Blood, UA: NEGATIVE
Glucose, UA: NEGATIVE mg/dL
Ketones, POC UA: NEGATIVE mg/dL
Leukocytes, UA: NEGATIVE
Nitrite, UA: NEGATIVE
Protein Ur, POC: NEGATIVE mg/dL
Specific Gravity, Urine: 1.015
Urobilinogen, Ur: 0.2
pH, UA: 6.5 (ref 5.0–8.0)

## 2022-05-12 MED ORDER — FLUOCINOLONE ACETONIDE 0.01 % OT OIL: 1.0000 | TOPICAL_OIL | Freq: Every day | OTIC | 3 refills | Status: DC

## 2022-05-12 MED ORDER — TAMSULOSIN HCL 0.4 MG PO CAPS: 0.4000 mg | ORAL_CAPSULE | Freq: Every day | ORAL | 1 refills | Status: DC

## 2022-05-12 NOTE — Progress Notes (Signed)
Subjective:  Terry Gross is a 57 y.o. male who presents for Chief Complaint  Patient presents with   Bubbles in urine    Patient has noticed bubbles in his urine in the toilet after he urinates. Does not have any other symptoms. Also his ears have been itching for 6 months. His friend gave him some ear drops and they helped. (Fluocinolone acetonide oil 0.01%)     Here for concerns.   Medical history significant for hypertension, PVD, smoker, impaired fasting glucose, hyperlipidemia, carotid artery occlusion, aortic valve disease, with concerns about urination.   Getting bubbles in urine for past 2 weeks.  Drinks water, drinks a lot of coffee too.  Drinks some juice as well.   Still smoking some.   No alcohol, none in years.  Feels fine in general.  Having ear itching intermittent for months.  Used some steroid ear oil from a friend.  Thinks it may be eczema.    No other aggravating or relieving factors.    No other c/o.  Past Medical History:  Diagnosis Date   Aortic valve insufficiency 12/15/2014   Moderate by ECHO 12/15/14 normal LV function    Back pain    Carotid artery occlusion    Cataract    Hyperlipidemia    Hypertension    Impaired fasting blood sugar    Left epiretinal membrane 09/12/2019   This condition is resolved after vitrectomy and membrane peel was performed, 02/06/2019.  Topographic distortion completely released.   Loss of balance    Peripheral vascular disease (HCC)    Smoker    Vitreous hemorrhage of left eye ([place]) 06/10/2019   Current Outpatient Medications on File Prior to Visit  Medication Sig Dispense Refill   atorvastatin (LIPITOR) 40 MG tablet Take 1 tablet (40 mg total) by mouth daily. 90 tablet 0   brimonidine (ALPHAGAN) 0.15 % ophthalmic solution Place 1 drop into the left eye 2 (two) times daily. 10 mL 0   clopidogrel (PLAVIX) 75 MG tablet Take 1 tablet (75 mg total) by mouth daily. 90 tablet 3   dorzolamide-timolol (COSOPT) 2-0.5 % ophthalmic  solution Place 1 drop into the left eye 2 (two) times daily.     latanoprost (XALATAN) 0.005 % ophthalmic solution INSTILL 1 DROP IN LEFT EYE NIGHTLY 2.5 mL 3   Omega-3 Fatty Acids (FISH OIL) 1200 MG CAPS Take by mouth daily.     timolol (TIMOPTIC) 0.5 % ophthalmic solution 1 drop left eye once daily 5 mL 12   No current facility-administered medications on file prior to visit.    The following portions of the patient's history were reviewed and updated as appropriate: allergies, current medications, past family history, past medical history, past social history, past surgical history and problem list.  ROS Otherwise as in subjective above    Objective: BP 120/60   Pulse 72   Ht 5\' 8"  (1.727 m)   Wt 148 lb 3.2 oz (67.2 kg)   BMI 22.53 kg/m   General appearance: alert, no distress, well developed, well nourished HEENT: normocephalic, sclerae anicteric, conjunctiva pink and moist, TMs pearly, nares patent, no discharge or erythema, pharynx normal Oral cavity: somewhat dry MM, no lesions Neck: supple, no lymphadenopathy, no thyromegaly, no masses Heart: 2/6 holosystolic murmur in upper sternal border, otherwise RRR, normal S1, S2 Lungs:decreases in general, no wheezes, rhonchi, or rales Abdomen: +bs, soft, non tender, non distended, no masses, no hepatomegaly, no splenomegaly, no bruits Pulses: 2+ radial pulses, 1+ pedal pulses, normal  cap refill Ext: no edema Skin: dry flaky skin of both external ears    Assessment: Encounter Diagnoses  Name Primary?   Urine abnormality Yes   Urine finding    Eczema of both external ears    Benign prostatic hyperplasia, unspecified whether lower urinary tract symptoms present    Tobacco abuse    Vaccine counseling    Need for immunization against influenza      Plan: Urine finding - discussed possible causes . Likely needs to drink more water.  Counseled on water intake.  Labs today to rule out other possibilities  Eczema of ears -  advised daily moisturizing lotion, cream as below for flare ups  BPH - refilled medicaiton which seems to help his urinary symptoms  Counseled on the influenza virus vaccine.  Vaccine information sheet given.  Influenza vaccine given after consent obtained.  Continue efforts to completely quit tobacco  Jarmel was seen today for bubbles in urine.  Diagnoses and all orders for this visit:  Urine abnormality -     POCT Urinalysis DIP (Proadvantage Device) -     Comprehensive metabolic panel -     CBC  Urine finding -     Comprehensive metabolic panel -     CBC  Eczema of both external ears  Benign prostatic hyperplasia, unspecified whether lower urinary tract symptoms present  Tobacco abuse  Vaccine counseling  Need for immunization against influenza -     Flu Vaccine QUAD 63mo+IM (Fluarix, Fluzone & Alfiuria Quad PF)  Other orders -     tamsulosin (FLOMAX) 0.4 MG CAPS capsule; Take 1 capsule (0.4 mg total) by mouth daily. TAKE 1 CAPSULE BY MOUTH 3 DAYS PER WEEK -     Fluocinolone Acetonide 0.01 % OIL; Place 1 Application in ear(s) daily.    Follow up: pending labs

## 2022-05-13 LAB — CBC
Hematocrit: 41.7 % (ref 37.5–51.0)
Hemoglobin: 14.3 g/dL (ref 13.0–17.7)
MCH: 31.4 pg (ref 26.6–33.0)
MCHC: 34.3 g/dL (ref 31.5–35.7)
MCV: 92 fL (ref 79–97)
Platelets: 256 10*3/uL (ref 150–450)
RBC: 4.55 x10E6/uL (ref 4.14–5.80)
RDW: 13.1 % (ref 11.6–15.4)
WBC: 7 10*3/uL (ref 3.4–10.8)

## 2022-05-13 LAB — COMPREHENSIVE METABOLIC PANEL
ALT: 18 IU/L (ref 0–44)
AST: 16 IU/L (ref 0–40)
Albumin/Globulin Ratio: 1.6 (ref 1.2–2.2)
Albumin: 4.4 g/dL (ref 3.8–4.9)
Alkaline Phosphatase: 78 IU/L (ref 44–121)
BUN/Creatinine Ratio: 15 (ref 9–20)
BUN: 11 mg/dL (ref 6–24)
Bilirubin Total: 0.4 mg/dL (ref 0.0–1.2)
CO2: 23 mmol/L (ref 20–29)
Calcium: 9.3 mg/dL (ref 8.7–10.2)
Chloride: 103 mmol/L (ref 96–106)
Creatinine, Ser: 0.71 mg/dL — ABNORMAL LOW (ref 0.76–1.27)
Globulin, Total: 2.8 g/dL (ref 1.5–4.5)
Glucose: 97 mg/dL (ref 70–99)
Potassium: 4.3 mmol/L (ref 3.5–5.2)
Sodium: 141 mmol/L (ref 134–144)
Total Protein: 7.2 g/dL (ref 6.0–8.5)
eGFR: 106 mL/min/{1.73_m2} (ref >59)

## 2022-05-13 NOTE — Progress Notes (Signed)
Liver electrolytes blood sugar and kidney markers okay, blood counts okay.  This would suggest that what ever bubbles you are seen in the urine is nothing significant.  I would recommend you increase your water intake though as it does not sound getting enough water in general.  Preferably 60+ ounces of water daily

## 2022-05-19 ENCOUNTER — Telehealth: Payer: Self-pay | Admitting: Medical

## 2022-05-19 NOTE — Telephone Encounter (Signed)
Please advise 

## 2022-05-19 NOTE — Telephone Encounter (Signed)
Pt wants to switch back to taking rosuvastatin instead of atorvastatin but he asks if you can send it in as a 90 day supply. Prefers   [place], [place]

## 2022-05-20 ENCOUNTER — Other Ambulatory Visit: Payer: Self-pay | Admitting: Medical

## 2022-05-20 MED ORDER — ROSUVASTATIN CALCIUM 20 MG PO TABS: 20.0000 mg | ORAL_TABLET | Freq: Every day | ORAL | 3 refills | Status: DC

## 2022-05-30 ENCOUNTER — Telehealth: Payer: Self-pay | Admitting: Family Medicine

## 2022-05-30 NOTE — Telephone Encounter (Signed)
Pt said when he picked up rosuvastatin this time it was 20 mg, he states he has been taking 40mg .  Please advise. (I reviewed notes and could not find)

## 2022-05-31 ENCOUNTER — Other Ambulatory Visit: Payer: Self-pay | Admitting: Medical

## 2022-05-31 MED ORDER — ROSUVASTATIN CALCIUM 40 MG PO TABS: 40.0000 mg | ORAL_TABLET | Freq: Every day | ORAL | 3 refills | Status: DC

## 2022-05-31 NOTE — Telephone Encounter (Signed)
Called pt, advised of same.

## 2022-06-27 ENCOUNTER — Encounter (INDEPENDENT_AMBULATORY_CARE_PROVIDER_SITE_OTHER): Payer: BC Managed Care – PPO | Admitting: Ophthalmology

## 2022-06-30 DIAGNOSIS — H4052X3 Glaucoma secondary to other eye disorders, left eye, severe stage: Secondary | ICD-10-CM | POA: Diagnosis not present

## 2022-08-23 ENCOUNTER — Other Ambulatory Visit: Payer: Self-pay | Admitting: Medical

## 2022-08-23 ENCOUNTER — Telehealth: Payer: Self-pay | Admitting: Medical

## 2022-08-23 MED ORDER — CLOPIDOGREL BISULFATE 75 MG PO TABS: 75.0000 mg | ORAL_TABLET | Freq: Every day | ORAL | 0 refills | Status: DC

## 2022-08-23 NOTE — Telephone Encounter (Signed)
Pt needs refill Plavix to [facility]

## 2022-09-27 ENCOUNTER — Encounter: Payer: 59 | Admitting: Medical

## 2022-10-04 ENCOUNTER — Telehealth: Payer: Self-pay

## 2022-10-04 DIAGNOSIS — I6523 Occlusion and stenosis of bilateral carotid arteries: Secondary | ICD-10-CM

## 2022-10-04 NOTE — Telephone Encounter (Addendum)
Pt walked into office, filled out triage form.  Reviewed pt's chart, returned call for clarification, no answer, lf vm. Pt should have a 6 mo recall from 04/22/22. Schedulers made aware.  Pt returned call. Pt denies any stroke-like symptoms. He states that on Sunday, some of the fingers on his R hand froze in place and he couldn't move them for a few minutes. This was the first and only time he's experienced this and was concerned that it may be related to his carotid issues. Informed him that he was due for a 6 month f/u. Appts for [place] and [place] scheduled. Confirmed understanding.

## 2022-10-04 NOTE — Addendum Note (Signed)
Addended by: Leilani Able, Meriem Lemieux A on: 10/04/2022 04:41 PM   Modules accepted: Orders

## 2022-10-13 NOTE — Progress Notes (Signed)
Office Note     CC:  follow up Requesting Provider:  Jac Canavan, PA-C  HPI: Terry Gross is a 57 y.o. (10-29-1965) male who presents for routine follow up of carotid artery stenosis. He has known left ICA occlusion. We have been following his right ICA stenosis of 60-79%. This was identified on initial ultrasound in 2020. Since has shown more in the 40-59%.   Today he is here for follow up with carotid duplex. He reports overall he is doing well. He denies any amaurosis fugax or other visual changes, slurred speech, facial drooping, unilateral upper or lower extremity weakness or numbness. He says he exercises daily. He does continue to smoke.   He is currently on Statin for hyperlipidemia. He takes Aspirin and Plavix He is not on medication for Hypertension He is not Diabetic He is a current smoker, 1/4 ppd  Past Medical History:  Diagnosis Date   Aortic valve insufficiency 12/15/2014   Moderate by ECHO 12/15/14 normal LV function    Back pain    Carotid artery occlusion    Cataract    Hyperlipidemia    Hypertension    Impaired fasting blood sugar    Left epiretinal membrane 09/12/2019   This condition is resolved after vitrectomy and membrane peel was performed, 02/06/2019.  Topographic distortion completely released.   Loss of balance    Peripheral vascular disease (HCC)    Smoker    Vitreous hemorrhage of left eye (HCC) 06/10/2019    Past Surgical History:  Procedure Laterality Date   CATARACT EXTRACTION     COLONOSCOPY  07/2020   never    Social History   Socioeconomic History   Marital status: Married    Spouse name: Not on file   Number of children: 0   Years of education: Not on file   Highest education level: Not on file  Occupational History   Not on file  Tobacco Use   Smoking status: Every Day    Packs/day: 0.25    Years: 32.00    Additional pack years: 0.00    Total pack years: 8.00    Types: Cigarettes    Passive exposure: Never    Smokeless tobacco: Never  Vaping Use   Vaping Use: Never used  Substance and Sexual Activity   Alcohol use: No   Drug use: No   Sexual activity: Not on file  Other Topics Concern   Not on file  Social History Narrative   Married, no children.   Working at [employer], [employer].   Exercise some with calisthenics, walking.    From [place] originally.   07/2020   Social Determinants of Health   Financial Resource Strain: Not on file  Food Insecurity: Not on file  Transportation Needs: Not on file  Physical Activity: Not on file  Stress: Not on file  Social Connections: Not on file  Intimate Partner Violence: Not on file    Family History  Problem Relation Age of Onset   CVA Mother    Other Mother        brain tumor   Other Father        complications from prostate sugery   Cancer Father        prostate   Thyroid disease Neg Hx    Heart disease Neg Hx    Diabetes Neg Hx    Kidney disease Neg Hx    Hypertension Neg Hx    Hyperlipidemia Neg Hx  Current Outpatient Medications  Medication Sig Dispense Refill   brimonidine (ALPHAGAN) 0.15 % ophthalmic solution Place 1 drop into the left eye 2 (two) times daily. 10 mL 0   clopidogrel (PLAVIX) 75 MG tablet Take 1 tablet (75 mg total) by mouth daily. 90 tablet 0   dorzolamide-timolol (COSOPT) 2-0.5 % ophthalmic solution Place 1 drop into the left eye 2 (two) times daily.     Fluocinolone Acetonide 0.01 % OIL Place 1 Application in ear(s) daily. 20 mL 3   latanoprost (XALATAN) 0.005 % ophthalmic solution INSTILL 1 DROP IN LEFT EYE NIGHTLY 2.5 mL 3   Omega-3 Fatty Acids (FISH OIL) 1200 MG CAPS Take by mouth daily.     rosuvastatin (CRESTOR) 40 MG tablet Take 1 tablet (40 mg total) by mouth daily. 90 tablet 3   tamsulosin (FLOMAX) 0.4 MG CAPS capsule Take 1 capsule (0.4 mg total) by mouth daily. TAKE 1 CAPSULE BY MOUTH 3 DAYS PER WEEK 90 capsule 1   timolol (TIMOPTIC) 0.5 % ophthalmic solution 1 drop left eye once daily 5 mL  12   No current facility-administered medications for this visit.    No Known Allergies   REVIEW OF SYSTEMS:   [X]  denotes positive finding, [ ]  denotes negative finding Cardiac  Comments:  Chest pain or chest pressure:    Shortness of breath upon exertion:    Short of breath when lying flat:    Irregular heart rhythm:        Vascular    Pain in calf, thigh, or hip brought on by ambulation:    Pain in feet at night that wakes you up from your sleep:     Blood clot in your veins:    Leg swelling:         Pulmonary    Oxygen at home:    Productive cough:     Wheezing:         Neurologic    Sudden weakness in arms or legs:     Sudden numbness in arms or legs:     Sudden onset of difficulty speaking or slurred speech:    Temporary loss of vision in one eye:     Problems with dizziness:         Gastrointestinal    Blood in stool:     Vomited blood:         Genitourinary    Burning when urinating:     Blood in urine:        Psychiatric    Major depression:         Hematologic    Bleeding problems:    Problems with blood clotting too easily:        Skin    Rashes or ulcers:        Constitutional    Fever or chills:      PHYSICAL EXAMINATION:  Vitals:   10/17/22 1135 10/17/22 1136  BP: 131/75 138/76  Pulse: 61   Temp: (!) 97.5 F (36.4 C)   TempSrc: Temporal   SpO2: 98%   Weight: 143 lb (64.9 kg)     General:  WDWN in NAD; vital signs documented above Gait: Normal HENT: WNL, normocephalic Pulmonary: normal non-labored breathing , without wheezing Cardiac: regular HR Abdomen: soft, NT, no masses Vascular Exam/Pulses: 2+ radial and DP pulses bilaterally Extremities: without ischemic changes, without Gangrene , without cellulitis; without open wounds;  Musculoskeletal: no muscle wasting or atrophy  Neurologic: A&O X 3 Psychiatric:  The pt has Normal affect.   Non-Invasive Vascular Imaging:   VAS Carotid Duplex: Summary:  Right Carotid:  Velocities in the right ICA are consistent with a 40-59% stenosis.   Left Carotid: Evidence consistent with a total occlusion of the left ICA.   Vertebrals:  Bilateral vertebral arteries demonstrate antegrade flow.  Subclavians: Normal flow hemodynamics were seen in bilateral subclavian arteries.     ASSESSMENT/PLAN:: 57 y.o. male here for follow up for carotid artery disease. He is without any signs or symptoms of TIA or stroke. His duplex today is stable. Known occluded left ICA occlusion. Right with 40-59% stenosis. Normal flow in the vertebral and subclavian arteries bilaterally. - Continue Plavix and Statin - He will follow up in 1 year with repeat carotid duplex   Graceann Congress, PA-C [facility] [phone]  Clinic MD:   Myra Gianotti

## 2022-10-17 ENCOUNTER — Ambulatory Visit (INDEPENDENT_AMBULATORY_CARE_PROVIDER_SITE_OTHER): Payer: BC Managed Care – PPO | Admitting: Physician Assistant

## 2022-10-17 ENCOUNTER — Ambulatory Visit (HOSPITAL_COMMUNITY)
Admission: RE | Admit: 2022-10-17 | Discharge: 2022-10-17 | Disposition: A | Discharge status: Home / Self Care | Payer: BC Managed Care – PPO | Source: Ambulatory Visit | Attending: Surgery | Admitting: Surgery

## 2022-10-17 VITALS — BP 138/76 | HR 61 | Temp 97.5°F | Wt 143.0 lb

## 2022-10-17 DIAGNOSIS — I6523 Occlusion and stenosis of bilateral carotid arteries: Secondary | ICD-10-CM | POA: Insufficient documentation

## 2022-10-17 DIAGNOSIS — I6521 Occlusion and stenosis of right carotid artery: Secondary | ICD-10-CM

## 2022-10-17 DIAGNOSIS — I6522 Occlusion and stenosis of left carotid artery: Secondary | ICD-10-CM | POA: Diagnosis not present

## 2022-10-26 ENCOUNTER — Other Ambulatory Visit: Payer: Self-pay

## 2022-10-26 DIAGNOSIS — I6523 Occlusion and stenosis of bilateral carotid arteries: Secondary | ICD-10-CM

## 2022-11-03 ENCOUNTER — Encounter: Payer: Self-pay | Admitting: Medical

## 2022-11-03 ENCOUNTER — Ambulatory Visit (INDEPENDENT_AMBULATORY_CARE_PROVIDER_SITE_OTHER): Payer: BC Managed Care – PPO | Admitting: Medical

## 2022-11-03 VITALS — BP 126/84 | HR 74 | Wt 143.6 lb

## 2022-11-03 DIAGNOSIS — R300 Dysuria: Secondary | ICD-10-CM

## 2022-11-03 LAB — POCT URINALYSIS DIP (PROADVANTAGE DEVICE)
Blood, UA: NEGATIVE
Glucose, UA: NEGATIVE mg/dL
Ketones, POC UA: NEGATIVE mg/dL
Leukocytes, UA: NEGATIVE
Nitrite, UA: NEGATIVE
Protein Ur, POC: NEGATIVE mg/dL
Specific Gravity, Urine: 1.02
Urobilinogen, Ur: 0.2
pH, UA: 6 (ref 5.0–8.0)

## 2022-11-03 MED ORDER — TAMSULOSIN HCL 0.4 MG PO CAPS: 0.4000 mg | ORAL_CAPSULE | Freq: Every day | ORAL | 1 refills | Status: DC

## 2022-11-03 MED ORDER — DOXYCYCLINE HYCLATE 100 MG PO TABS: 100.0000 mg | ORAL_TABLET | Freq: Two times a day (BID) | ORAL | 0 refills | Status: DC

## 2022-11-03 NOTE — Patient Instructions (Signed)
Your urine looks pretty normal today.  I will send the urine out for culture to see if infection is growing.  Sometimes burning with urination can be related to caffeine use, need for more water intake, possible infection in the urinary tract or prostate, can also be related to excess sugar intake or spicy food intake.    Currently today it is not obvious that you have an infection.  Lets await the urine culture results.  I included some information below about prostate infection in case your symptoms change or the next few days    Prostatitis  Prostatitis is swelling or inflammation of the prostate gland, also called the prostate. This gland is about 1.5 inches wide and 1 inch high, and it is involved in making semen. The prostate is located below a man's bladder, in front of the rectum. There are four types of prostatitis: Chronic prostatitis (CP), also called chronic pelvic pain syndrome (CPPS). This is the most common type of prostatitis. It is associated with increased muscle tone in the area between the hip bones (pelvic area), around the prostate. This type is also known as a pelvic floor disorder. Chronic bacterial prostatitis. This type usually results from an acute bacterial infection in the prostate gland that keeps coming back or has not been treated properly. The symptoms are less severe than those caused by acute bacterial prostatitis, which lasts a shorter time. Asymptomatic inflammatory prostatitis. This type does not have symptoms and does not need treatment. This is diagnosed when tests are done for other disorders of the urinary tract or reproductive tract. Acute bacterial prostatitis. This type starts quickly and results from an acute bacterial infection in the prostate gland. It is usually associated with a bladder infection, high fever, and chills. This is the least common type of prostatitis. What are the causes? Bacterial prostatitis is caused by an infection from  bacteria. Chronic nonbacterial prostatitis may be caused by: Factors related to the nervous system. This system includes thebrain, spinal cord, and nerves. An autoimmune response. This happens when the body's disease-fighting system attacks healthy tissue in the body by mistake. Psychological factors. These have to do with how the mind works. The causes of the other types of prostatitis are usually not known. What are the signs or symptoms? Symptoms of this condition depend on the type of prostatitis you have. Acute bacterial prostatitis Symptoms may include: Pain or burning during urination. Frequent and sudden urges to urinate. Trouble starting to urinate. Fever. Chills. Pain in your muscles or joints, lower back, or lower abdomen. Other types of prostatitis Symptoms may include: Sudden urges to urinate, or urinating often. Trouble starting to urinate. Weak urine stream. Dribbling after urination. Discharge coming from the penis. Pain in the testicles, the penis, or the tip of the penis. Pain in the area in front of the rectum and below the scrotum (perineum). Pain when ejaculating. How is this diagnosed? This condition may be diagnosed based on: A physical and medical exam. A digital rectal exam. For this, the health care provider may use a finger to feel the prostate. A urine test to check for bacteria. A semen sample or blood tests. Ultrasound. Urodynamic tests to check how your body handles urine. Cystoscopy to look inside your bladder or inside the part of your body that drains urine from the bladder (urethra). How is this treated? Treatment for this condition depends on the type of prostatitis. Treatment may involve: Medicines to relieve pain or inflammation, or to help  relax your muscles. Physical therapy. Heat therapy. Biofeedback. These techniques help you control certain body functions. Relaxation exercises. Antibiotic medicine, if your condition is caused by  bacteria. Sitz baths. These warm water baths help to relax your pelvic floor muscles, which helps to relieve pressure on the prostate. Follow these instructions at home: Medicines Take over-the-counter and prescription medicines only as told by your health care provider. If you were prescribed an antibiotic medicine, take it as told by your health care provider. Do not stop using the antibiotic even if you start to feel better. Managing pain and swelling  Take sitz baths as directed by your health care provider. For a sitz bath, sit in warm water that is deep enough to cover your hips and buttocks. If directed, apply heat to the affected area as often as told by your health care provider. Use the heat source that your health care provider recommends, such as a moist heat pack or a heating pad. Place a towel between your skin and the heat source. Leave the heat on for 20-30 minutes. Remove the heat if your skin turns bright red. This is especially important if you are unable to feel pain, heat, or cold. You may have a greater risk of getting burned. General instructions Do exercises as told by your health care provider, if you were prescribed physical therapy, biofeedback, or relaxation exercises. Keep all follow-up visits as told by your health care provider. This is important. Where to find more information [employer] of Diabetes and Digestive and Kidney Diseases: LowApproval[employer]se Contact a health care provider if: Your symptoms get worse. You have a fever. Get help right away if: You have chills. You feel light-headed or feel like you may faint. You cannot urinate. You have blood or blood clots in your urine. Summary Prostatitis is swelling or inflammation of the prostate gland. Treatment for this condition depends on the type of prostatitis. Take over-the-counter and prescription medicines only as told by your health care provider. Get help right away of you have  chills, feel light-headed, feel like you may faint, cannot urinate, or have blood or blood clots in your urine. This information is not intended to replace advice given to you by your health care provider. Make sure you discuss any questions you have with your health care provider. Document Revised: 03/10/2022 Document Reviewed: 03/10/2022 Elsevier Patient Education  2024 [employer].

## 2022-11-03 NOTE — Progress Notes (Signed)
Subjective:  Sani Madariaga is a 57 y.o. male who presents for Chief Complaint  Patient presents with   other    Burning with urination, been going on about 10 days, frequent urination,      Here with burning with urination.  Has been drinking some different juice at home with parsley with lemon, but noticed some burning with urination.  Not a pain, but burning with urination the last 10 days or so.  Tried some cranberry juice, OTC UriCalm.   No swelling of penis or testicles.  No blood in urine, no cloudy, no odor.  Wife with no urinary or yeast infection.    Drinks 2 -3 cups of coffee daily, but no change from ordinary.  Eats some spicy foods.  Eats a lot of vegetable and fresh fish.  Doesn't eat out a lot.    No recent changes in soap or hygiene product.  No other aggravating or relieving factors.    No other c/o.  Past Medical History:  Diagnosis Date   Aortic valve insufficiency 12/15/2014   Moderate by ECHO 12/15/14 normal LV function    Back pain    Carotid artery occlusion    Cataract    Hyperlipidemia    Hypertension    Impaired fasting blood sugar    Left epiretinal membrane 09/12/2019   This condition is resolved after vitrectomy and membrane peel was performed, 02/06/2019.  Topographic distortion completely released.   Loss of balance    Peripheral vascular disease (HCC)    Smoker    Vitreous hemorrhage of left eye (HCC) 06/10/2019   Current Outpatient Medications on File Prior to Visit  Medication Sig Dispense Refill   brimonidine (ALPHAGAN) 0.15 % ophthalmic solution Place 1 drop into the left eye 2 (two) times daily. 10 mL 0   clopidogrel (PLAVIX) 75 MG tablet Take 1 tablet (75 mg total) by mouth daily. 90 tablet 0   dorzolamide-timolol (COSOPT) 2-0.5 % ophthalmic solution Place 1 drop into the left eye 2 (two) times daily.     Fluocinolone Acetonide 0.01 % OIL Place 1 Application in ear(s) daily. 20 mL 3   latanoprost (XALATAN) 0.005 % ophthalmic solution INSTILL  1 DROP IN LEFT EYE NIGHTLY 2.5 mL 3   Omega-3 Fatty Acids (FISH OIL) 1200 MG CAPS Take by mouth daily.     rosuvastatin (CRESTOR) 40 MG tablet Take 1 tablet (40 mg total) by mouth daily. 90 tablet 3   timolol (TIMOPTIC) 0.5 % ophthalmic solution 1 drop left eye once daily 5 mL 12   No current facility-administered medications on file prior to visit.     The following portions of the patient's history were reviewed and updated as appropriate: allergies, current medications, past family history, past medical history, past social history, past surgical history and problem list.  ROS Otherwise as in subjective above  Objective: BP 126/84   Pulse 74   Wt 143 lb 9.6 oz (65.1 kg)   BMI 21.83 kg/m   General appearance: alert, no distress, well developed, well nourished Abdomen: +bs, soft, non tender, non distended, no masses, no hepatomegaly, no splenomegaly GU: Normal male GU, circumcised, no obvious swelling or mass or lymphadenopathy, no abnormality   Assessment: Encounter Diagnosis  Name Primary?   Burning with urination Yes     Plan: We discussed his symptoms and concerns.  Currently no obvious infection on urinalysis.  Urine culture sent.  Discussed possible differential, infection, caffeine, inflammation or other.  Continue to hydrate well  throughout the day with water.  Change flomax to daily given some changes in urine frequency and nocturia, begin short term trial of doxycycline  Nikko was seen today for other.  Diagnoses and all orders for this visit:  Burning with urination -     Urine Culture -     POCT Urinalysis DIP (Proadvantage Device)  Other orders -     tamsulosin (FLOMAX) 0.4 MG CAPS capsule; Take 1 capsule (0.4 mg total) by mouth daily after supper. -     doxycycline (VIBRA-TABS) 100 MG tablet; Take 1 tablet (100 mg total) by mouth 2 (two) times daily.    Follow up: pending lab

## 2022-11-05 LAB — URINE CULTURE

## 2022-11-06 NOTE — Progress Notes (Signed)
Results sent through MyChart

## 2022-11-07 ENCOUNTER — Ambulatory Visit: Payer: 59 | Admitting: Cardiology

## 2022-11-07 ENCOUNTER — Encounter: Payer: Self-pay | Admitting: Cardiology

## 2022-11-07 VITALS — BP 126/76 | HR 73 | Ht 68.0 in | Wt 142.0 lb

## 2022-11-07 DIAGNOSIS — F17210 Nicotine dependence, cigarettes, uncomplicated: Secondary | ICD-10-CM

## 2022-11-07 DIAGNOSIS — I350 Nonrheumatic aortic (valve) stenosis: Secondary | ICD-10-CM | POA: Diagnosis not present

## 2022-11-07 DIAGNOSIS — I6523 Occlusion and stenosis of bilateral carotid arteries: Secondary | ICD-10-CM | POA: Diagnosis not present

## 2022-11-07 DIAGNOSIS — I351 Nonrheumatic aortic (valve) insufficiency: Secondary | ICD-10-CM

## 2022-11-07 MED ORDER — BUPROPION HCL ER (SR) 100 MG PO TB12
100.0000 mg | ORAL_TABLET | Freq: Two times a day (BID) | ORAL | 3 refills | Status: DC
Start: 2022-11-07 — End: 2023-06-05

## 2022-11-07 NOTE — Progress Notes (Signed)
Patient referred by Jac Canavan, PA-C for aortic insufficiency  Subjective:   Terry Gross, male    DOB: [birth year], 57 y.o.   MRN: [id number]   Chief Complaint  Patient presents with   Moderate aortic stenosis   Follow-up    HPI  57 year old male with hypertension, hyperlipidemia, mod AS, mod AI, Lt IC occlusion, retinal artery occlusion, tobacco dependence  I last saw the patient in 2022.  Unfortunately, he lost transfer sometime, as his wife through home he was getting to bed and insurance, lost her job.  Fortunately, she has got a new job, and patient has insurance now.  He has no complaints today.  Exercising regularly without any complaints of chest pain or shortness of breath.  Reviewed recent vascular ultrasound of carotids.  He is compliant with his medical therapy.  He continues to have problems with his left eye, but has good vision in the right eye.  Office visit note 2022: He is seeing ophthalmologist regularly for vitreous hemorrhage of left eye and getting Avastin injections. His vision in his left eye remains close to nil.  Blood pressure is well controlled. He follows with vascular surgery re: carotid disease. Blood pressure is controlled. He denies chest pain, shortness of breath, palpitations, leg edema, orthopnea, PND, TIA/syncope. Reviewed recent echocardiogram result with the patient, details below. He had quit smoking, but started smoking back after he lost his mother. He is down to 5 cigarettes/day now.    Initial consultation HPI 05/2019: Mild aortic insufficiency, moderate aortic stenosis was noted on outside echocardiogram in May 2020.  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 His major issue over the last few years has been left eye. He was told to have problems with his retina, as well as cataract. He has undergone several surgeries, and has had minimal improvement in his vision.    Patient is originally from [place], has been living in [place] since 1995 with his wife, works at [employer]. He works out regularly at [employer], doing aerobic as well as Runner, broadcasting/film/video. He denies chest pain, shortness of breath, palpitations, leg edema, orthopnea, PND, TIA/syncope.  His lipid panel has improved remarkably on atorvastatin.    Current Outpatient Medications:    brimonidine (ALPHAGAN) 0.15 % ophthalmic solution, Place 1 drop into the left eye 2 (two) times daily., Disp: 10 mL, Rfl: 0   clopidogrel (PLAVIX) 75 MG tablet, Take 1 tablet (75 mg total) by mouth daily., Disp: 90 tablet, Rfl: 0   dorzolamide-timolol (COSOPT) 2-0.5 % ophthalmic solution, Place 1 drop into the left eye 2 (two) times daily., Disp: , Rfl:    doxycycline (VIBRA-TABS) 100 MG tablet, Take 1 tablet (100 mg total) by mouth 2 (two) times daily., Disp: 14 tablet, Rfl: 0   Fluocinolone Acetonide 0.01 % OIL, Place 1 Application in ear(s) daily., Disp: 20 mL, Rfl: 3   latanoprost (XALATAN) 0.005 % ophthalmic solution, INSTILL 1 DROP IN LEFT EYE NIGHTLY, Disp: 2.5 mL, Rfl: 3   Omega-3 Fatty Acids (FISH OIL) 1200 MG CAPS, Take by mouth daily., Disp: , Rfl:    rosuvastatin (CRESTOR) 40 MG tablet, Take 1 tablet (40 mg total) by mouth daily., Disp: 90 tablet, Rfl: 3   tamsulosin (FLOMAX) 0.4 MG CAPS capsule, Take 1 capsule (0.4 mg total) by mouth daily after supper., Disp: 90 capsule, Rfl: 1   timolol (TIMOPTIC) 0.5 % ophthalmic solution, 1 drop left eye once daily,  Disp: 5 mL, Rfl: 12  Cardiovascular and other pertinent studies:  EKG 11/07/2022: Sinus rhythm 72 bpm Normal EKG  Carotid US 10/17/2022: Summary:  Right Carotid: Velocities in the right ICA are consistent with a 40-59% stenosis.  Left Carotid: Evidence consistent with a total occlusion of the left ICA.   Vertebrals:  Bilateral vertebral arteries demonstrate antegrade flow.  Subclavians: Normal flow hemodynamics were seen in bilateral  subclavian arteries.   Echocardiogram 10/29/2020:  Normal LV systolic function with visual EF 55-60%. Left ventricle cavity  is normal in size. Mild left ventricular hypertrophy. Normal global wall  motion. Normal diastolic filling pattern, normal LAP.  Left atrial cavity is mildly dilated.  Mild aortic stenosis. Peak velocity 2.34m/s, Peak Gradient , Mean  Gradient 16.61mmHg, AVA 1.9cm.  Moderate to severe aortic regurgitation.    Mild (Grade I) mitral regurgitation.  Mild tricuspid regurgitation. No evidence of pulmonary hypertension.  Prior study dated 10/16/2019: LVEF 50-55%, G1DD, moderate AR, moderate AS,  moderate MR.   Vascular US 04/09/2019:  Right Carotid: Velocities in the right ICA are consistent with a 40-59% stenosis. Left Carotid: Evidence consistent with a total occlusion of the left ICA. Vertebrals:  Bilateral vertebral arteries demonstrate antegrade flow.  Subclavians: Right subclavian artery flow was turbulent. Normal flow hemodynamics were seen in the left subclavian artery.   Recent labs: 05/12/2022: Glucose 97, BUN/Cr 11/0.71. EGFR 106. Na/K 141/4.3. Rest of the CMP normal H/H 14/41. MCV 92. Platelets 256  09/2021: HbA1C 5.7% Chol 128, TG 69, HDL 47, LDL 67 TSH 0.4 low, free TR 1.49 normal  07/22/2020: Glucose 95, BUN/Cr 13/0.69. EGFR normal. Na/K 143/5.1. Rest of the CMP normal H/H 15/46. MCV 97. Platelets 265 HbA1C 5.9% Chol 127, TG 48, HDL 53, LDL 63 TSH 0.5 normal   Review of Systems  Eyes:  Positive for vision loss in left eye.  Cardiovascular:  Negative for chest pain, dyspnea on exertion, leg swelling, palpitations and syncope.          Vitals:   11/07/22 1048  BP: 126/76  Pulse: 73  SpO2: 99%     Body mass index is 21.59 kg/m. Filed Weights   11/07/22 1048  Weight: 142 lb (64.4 kg)     Objective:   Physical Exam Vitals and nursing note reviewed.  Constitutional:      General: He is not in acute distress. Neck:      Vascular: No JVD.  Cardiovascular:     Rate and Rhythm: Normal rate and regular rhythm.     Heart sounds: Murmur heard.     Harsh midsystolic murmur is present with a grade of 2/6 at the upper right sternal border radiating to the neck.     High-pitched blowing decrescendo early diastolic murmur is present with a grade of 2/4 at the upper right sternal border radiating to the apex.  Pulmonary:     Effort: Pulmonary effort is normal.     Breath sounds: Normal breath sounds. No wheezing or rales.  Musculoskeletal:     Right lower leg: No edema.     Left lower leg: No edema.         Assessment & Recommendations:   57 year old male with hypertension, hyperlipidemia, peripheral artery disease, tobacco dependence, moderate aortic stenosis, mild aortic valve insufficiency.  Mild AS, mod -severe AI: Stable, clinically asymptomatic. Check echocardiogram now and repeat in 1 year.  Hyperlipidemia: Well controlled on rosuvastatin 40 mg daily.   PAD: Lt IC occlusion, Rt IC moderate stenosis.  Continue Plavix, statin.   Continue f/u /vascular surgery  Tobacco dependence: Tobacco cessation counseling:  - Currently smoking 6 cigarettes/day   - Patient was informed of the dangers of tobacco abuse including stroke, cancer, and MI, as well as benefits of tobacco cessation. - Patient is willing to quit at this time. - Approximately 5 mins were spent counseling patient cessation techniques. We discussed various methods to help quit smoking, including deciding on a date to quit, joining a support group, pharmacological agents. Patient would like to use Wellbutrin. - I will reassess his progress at the next follow-up visit  F/u in 1 year   Elder Negus, MD Pager: [phone] Office: [phone]

## 2022-11-14 ENCOUNTER — Other Ambulatory Visit: Payer: Self-pay | Admitting: Medical

## 2022-11-14 ENCOUNTER — Telehealth: Payer: Self-pay | Admitting: Medical

## 2022-11-14 DIAGNOSIS — R300 Dysuria: Secondary | ICD-10-CM

## 2022-11-14 NOTE — Telephone Encounter (Signed)
Please call Pt called he is still having urinary issues, burning with urination  ?referral

## 2022-11-17 ENCOUNTER — Telehealth: Payer: Self-pay | Admitting: Medical

## 2022-11-17 MED ORDER — ROSUVASTATIN CALCIUM 40 MG PO TABS: 40.0000 mg | ORAL_TABLET | Freq: Every day | ORAL | 0 refills | Status: DC

## 2022-11-17 MED ORDER — CLOPIDOGREL BISULFATE 75 MG PO TABS: 75.0000 mg | ORAL_TABLET | Freq: Every day | ORAL | 0 refills | Status: DC

## 2022-11-17 NOTE — Telephone Encounter (Signed)
I have refilled for 30 days

## 2022-11-17 NOTE — Telephone Encounter (Signed)
Pt requesting a refill on his plavix and rosuvastatin although the rosuvastatin shows refills he says he went to the [facility] and they are saying they see no refills. He uses [id number] - [place], [place] - [address] AT [facility] OF [address]

## 2022-11-22 ENCOUNTER — Ambulatory Visit (INDEPENDENT_AMBULATORY_CARE_PROVIDER_SITE_OTHER): Payer: BC Managed Care – PPO | Admitting: Medical

## 2022-11-22 ENCOUNTER — Encounter: Payer: Self-pay | Admitting: Medical

## 2022-11-22 VITALS — BP 102/60 | HR 80 | Ht 68.0 in | Wt 144.4 lb

## 2022-11-22 DIAGNOSIS — I8393 Asymptomatic varicose veins of bilateral lower extremities: Secondary | ICD-10-CM | POA: Diagnosis not present

## 2022-11-22 DIAGNOSIS — I6521 Occlusion and stenosis of right carotid artery: Secondary | ICD-10-CM

## 2022-11-22 DIAGNOSIS — I878 Other specified disorders of veins: Secondary | ICD-10-CM

## 2022-11-22 DIAGNOSIS — Z0000 Encounter for general adult medical examination without abnormal findings: Secondary | ICD-10-CM

## 2022-11-22 DIAGNOSIS — R7989 Other specified abnormal findings of blood chemistry: Secondary | ICD-10-CM

## 2022-11-22 DIAGNOSIS — E782 Mixed hyperlipidemia: Secondary | ICD-10-CM | POA: Diagnosis not present

## 2022-11-22 DIAGNOSIS — I739 Peripheral vascular disease, unspecified: Secondary | ICD-10-CM | POA: Diagnosis not present

## 2022-11-22 DIAGNOSIS — Z131 Encounter for screening for diabetes mellitus: Secondary | ICD-10-CM | POA: Insufficient documentation

## 2022-11-22 DIAGNOSIS — N400 Enlarged prostate without lower urinary tract symptoms: Secondary | ICD-10-CM

## 2022-11-22 DIAGNOSIS — Z720 Tobacco use: Secondary | ICD-10-CM

## 2022-11-22 DIAGNOSIS — Z125 Encounter for screening for malignant neoplasm of prostate: Secondary | ICD-10-CM | POA: Diagnosis not present

## 2022-11-22 DIAGNOSIS — Z122 Encounter for screening for malignant neoplasm of respiratory organs: Secondary | ICD-10-CM

## 2022-11-22 DIAGNOSIS — R7301 Impaired fasting glucose: Secondary | ICD-10-CM | POA: Diagnosis not present

## 2022-11-22 DIAGNOSIS — I700 Atherosclerosis of aorta: Secondary | ICD-10-CM

## 2022-11-22 LAB — POCT URINALYSIS DIP (PROADVANTAGE DEVICE)
Bilirubin, UA: NEGATIVE
Blood, UA: NEGATIVE
Glucose, UA: NEGATIVE mg/dL
Ketones, POC UA: NEGATIVE mg/dL
Leukocytes, UA: NEGATIVE
Nitrite, UA: NEGATIVE
Protein Ur, POC: NEGATIVE mg/dL
Specific Gravity, Urine: 1.02
Urobilinogen, Ur: 0.2
pH, UA: 6 (ref 5.0–8.0)

## 2022-11-22 NOTE — Progress Notes (Signed)
Subjective:   HPI  Terry Gross is a 57 y.o. male who presents for Chief Complaint  Patient presents with   Annual Exam    Annual exam, patient is not fasting. Patient does not have any new concerns.     Patient Care Team: Evyn Kooyman, [name], PA-C as PCP - General (Family Medicine) Elder Negus, MD as Consulting Physician (Cardiology) Luciana Axe Alford Highland, MD as Consulting Physician (Ophthalmology) Lars Mage, PA-C as Physician Assistant (Physician Assistant), vascular Has dentures, doesn't see dentist Dr. Gretta Began, vascular surgery Dr. Jethro Bolus, ophthalmology [facility] dermatology   Concerns: Nonfasting.  He had a few crackers about an hour ago but otherwise ate several hours ago some eggs alible and bread.  He just saw cardiology recently.  He has an echocardiogram coming in the month.  I saw him recently for urinary frequency.  He has a urology appointment coming up soon in [facility] with Dr. Margo Aye  Still smoking some, not heavy.    Doing fine in general  Reviewed their medical, surgical, family, social, medication, and allergy history and updated chart as appropriate.  Past Medical History:  Diagnosis Date   Aortic valve insufficiency 12/15/2014   Moderate by ECHO 12/15/14 normal LV function    Back pain    Carotid artery occlusion    Cataract    Hyperlipidemia    Hypertension    Impaired fasting blood sugar    Left epiretinal membrane 09/12/2019   This condition is resolved after vitrectomy and membrane peel was performed, 02/06/2019.  Topographic distortion completely released.   Loss of balance    Peripheral vascular disease (HCC)    Smoker    Vitreous hemorrhage of left eye (HCC) 06/10/2019    Past Surgical History:  Procedure Laterality Date   CATARACT EXTRACTION     COLONOSCOPY  07/2020   never    Family History  Problem Relation Age of Onset   CVA Mother    Other Mother        brain tumor   Other Father        complications from  prostate sugery   Cancer Father        prostate   Thyroid disease Neg Hx    Heart disease Neg Hx    Diabetes Neg Hx    Kidney disease Neg Hx    Hypertension Neg Hx    Hyperlipidemia Neg Hx      Current Outpatient Medications:    brimonidine (ALPHAGAN) 0.15 % ophthalmic solution, Place 1 drop into the left eye 2 (two) times daily., Disp: 10 mL, Rfl: 0   clopidogrel (PLAVIX) 75 MG tablet, Take 1 tablet (75 mg total) by mouth daily., Disp: 90 tablet, Rfl: 0   dorzolamide-timolol (COSOPT) 2-0.5 % ophthalmic solution, Place 1 drop into the left eye 2 (two) times daily., Disp: , Rfl:    latanoprost (XALATAN) 0.005 % ophthalmic solution, INSTILL 1 DROP IN LEFT EYE NIGHTLY, Disp: 2.5 mL, Rfl: 3   Omega-3 Fatty Acids (FISH OIL) 1200 MG CAPS, Take by mouth daily., Disp: , Rfl:    rosuvastatin (CRESTOR) 40 MG tablet, Take 1 tablet (40 mg total) by mouth daily., Disp: 90 tablet, Rfl: 0   tamsulosin (FLOMAX) 0.4 MG CAPS capsule, Take 1 capsule (0.4 mg total) by mouth daily after supper., Disp: 90 capsule, Rfl: 1   buPROPion ER (WELLBUTRIN SR) 100 MG 12 hr tablet, Take 1 tablet (100 mg total) by mouth 2 (two) times daily. (Patient not  taking: Reported on 11/22/2022), Disp: 60 tablet, Rfl: 3   Fluocinolone Acetonide 0.01 % OIL, Place 1 Application in ear(s) daily. (Patient not taking: Reported on 11/22/2022), Disp: 20 mL, Rfl: 3  No Known Allergies   Review of Systems  Constitutional:  Negative for chills, fever, malaise/fatigue and weight loss.  HENT:  Negative for congestion, ear pain, hearing loss, sore throat and tinnitus.   Eyes:  Negative for blurred vision, pain and redness.  Respiratory:  Negative for cough, hemoptysis and shortness of breath.   Cardiovascular:  Negative for chest pain, palpitations, orthopnea, claudication and leg swelling.  Gastrointestinal:  Negative for abdominal pain, blood in stool, constipation, diarrhea, nausea and vomiting.  Genitourinary:  Positive for frequency.  Negative for dysuria, flank pain, hematuria and urgency.  Musculoskeletal:  Negative for falls, joint pain and myalgias.  Skin:  Negative for itching and rash.  Neurological:  Negative for dizziness, tingling, speech change, weakness and headaches.  Endo/Heme/Allergies:  Negative for polydipsia. Does not bruise/bleed easily.  Psychiatric/Behavioral:  Negative for depression and memory loss. The patient is not nervous/anxious and does not have insomnia.         Objective:  BP 102/60   Pulse 80   Ht 5\' 8"  (1.727 m)   Wt 144 lb 6.4 oz (65.5 kg)   SpO2 97%   BMI 21.96 kg/m    BP Readings from Last 3 Encounters:  11/22/22 102/60  11/07/22 126/76  11/03/22 126/84   Wt Readings from Last 3 Encounters:  11/22/22 144 lb 6.4 oz (65.5 kg)  11/07/22 142 lb (64.4 kg)  11/03/22 143 lb 9.6 oz (65.1 kg)    General appearance: alert, no distress, WD/WN, Caucasian male Skin: Majority of bilateral anterior lower legs with patchy discoloration including some hypopigmented areas as well as hemosiderin staining consistent with venous stasis disease, left upper back with 3mm flat brown macule with darker colored center, right leg just superior to knee with brown 4mm x 3mm slight raied maculopapular lesion, no other worrisome lesions HEENT: normocephalic, conjunctiva/corneas normal, sclerae anicteric, PERRLA, EOMi, nares patent, no discharge or erythema, pharynx normal Oral cavity: MMM, tongue normal, edentulous Neck: supple, no lymphadenopathy, no thyromegaly, no masses, normal ROM, left carotid bruit Chest: non tender, normal shape and expansion Heart: 2/6 holosystolic murmur in upper sternal borders,  RRR, normal S1, S2, no murmurs Lungs: CTA bilaterally, no wheezes, rhonchi, or rales Abdomen: +bs, soft, non tender, non distended, no masses, no hepatomegaly, no splenomegaly, no bruits Back: non tender, normal ROM, no scoliosis Musculoskeletal: upper extremities non tender, no obvious deformity,  normal ROM throughout, lower extremities non tender, no obvious deformity, normal ROM throughout Extremities: no edema, no cyanosis, no clubbing Pulses: 2+ symmetric, upper  extremities, normal cap refill, decreased pulses in extremities chronically consistent with vascular disease Neurological: alert, oriented x 3, CN2-12 intact, strength normal upper extremities and lower extremities, sensation normal throughout, DTRs 2+ throughout, no cerebellar signs, gait normal Psychiatric: normal affect, behavior normal, pleasant  GU: declined Rectal: declined   Assessment and Plan :   Encounter Diagnoses  Name Primary?   Annual physical exam Yes   Abnormal thyroid blood test    Aortic atherosclerosis (HCC)    Benign prostatic hyperplasia, unspecified whether lower urinary tract symptoms present    Mixed hyperlipidemia    Impaired fasting blood sugar    Encounter for screening for lung cancer    Tobacco abuse    Varicose veins of both lower extremities, unspecified whether complicated  Venous stasis    Stenosis of right carotid artery    Screening for prostate cancer    PVD (peripheral vascular disease) (HCC)    Screening for diabetes mellitus    Screening for lung cancer     Today you had a preventative care visit or wellness visit.    Topics today may have included healthy lifestyle, diet, exercise, preventative care, vaccinations, sick and well care, proper use of emergency dept and after hours care, as well as other concerns.     Recommendations: Continue to return yearly for your annual wellness and preventative care visits.  This gives [place] a chance to discuss healthy lifestyle, exercise, vaccinations, review your chart record, and perform screenings where appropriate.  I recommend you see your eye doctor yearly for routine vision care.  I recommend you see your dentist yearly for routine dental care including hygiene visits twice yearly.   Vaccination recommendations were  reviewed Immunization History  Administered Date(s) Administered   Influenza,inj,Quad PF,6+ Mos 12/30/2014, 05/12/2022   PFIZER(Purple Top)SARS-COV-2 Vaccination 07/30/2019, 08/20/2019, 04/11/2020   PNEUMOCOCCAL CONJUGATE-20 09/22/2021   Pneumococcal Polysaccharide-23 12/30/2014   Td 07/22/2020   Shingles vaccine:  I recommend you have a shingles vaccine to help prevent shingles or herpes zoster outbreak.   Please call your insurer to inquire about coverage for the Shingrix vaccine given in 2 doses.   Some insurers cover this vaccine after age 70, some cover this after age 28.  If your insurer covers this, then call to schedule appointment to have this vaccine here.   Screening for cancer: Colon cancer screening:  Negative cologard 2022.    We discussed PSA, prostate exam, and prostate cancer screening risks/benefits.     Skin cancer screening: Check your skin regularly for new changes, growing lesions, or other lesions of concern Come in for evaluation if you have skin lesions of concern.  Lung cancer screening: Updated yearly Chest CT lung cancer screen ordered  We currently don't have screenings for other cancers besides breast, cervical, colon, and lung cancers.  If you have a strong family history of cancer or have other cancer screening concerns, please let me know.    Bone health: Get at least 150 minutes of aerobic exercise weekly Get weight bearing exercise at least once weekly  Heart health: Get at least 150 minutes of aerobic exercise weekly Limit alcohol It is important to maintain a healthy blood pressure and healthy cholesterol numbers  Follow up with echocardiogram soon as scheduled    Separate significant issues discussed: BPH-continue Flomax  Urinary frequency and burning without infection-follow-up with urology as planned for consult coming up soon  Aortic atherosclerosis, hyperlipidemia-continue statin  Peripheral vascular disease-continue Plavix and  statin  Venous stasis disease-continue routine walking for exercise.    I reviewed his cardiology and vascular surgery consult from the last few months.  He was continued on his current medications.  He has known left occluded ICA, right side 40 to 59% stenosis.  I will see him back in a year.   Problem List Items Addressed This Visit     Mixed hyperlipidemia   Tobacco abuse   Relevant Orders   CT CHEST LUNG CA SCREEN LOW DOSE W/O CM   Abnormal thyroid blood test   Relevant Orders   TSH + free T4   Impaired fasting blood sugar   PVD (peripheral vascular disease) (HCC)   Benign prostatic hyperplasia   Relevant Orders   PSA   Stenosis of right carotid  artery   Encounter for screening for lung cancer   Screening for prostate cancer   Relevant Orders   PSA   Venous stasis   Varicose veins of both lower extremities   Aortic atherosclerosis (HCC)   Relevant Orders   Lipid panel   Annual physical exam - Primary   Relevant Orders   POCT Urinalysis DIP (Proadvantage Device) (Completed)   Comprehensive metabolic panel   CBC   PSA   Lipid panel   TSH + free T4   Hemoglobin A1c   Screening for diabetes mellitus   Relevant Orders   Hemoglobin A1c   Other Visit Diagnoses     Screening for lung cancer       Relevant Orders   CT CHEST LUNG CA SCREEN LOW DOSE W/O CM       Follow-up pending labs, yearly for physical

## 2022-11-23 LAB — LIPID PANEL
Chol/HDL Ratio: 2.5 ratio (ref 0.0–5.0)
Cholesterol, Total: 117 mg/dL (ref 100–199)
HDL: 47 mg/dL (ref >39)
LDL Chol Calc (NIH): 49 mg/dL (ref 0–99)
Triglycerides: 115 mg/dL (ref 0–149)
VLDL Cholesterol Cal: 21 mg/dL (ref 5–40)

## 2022-11-23 LAB — CBC
Hematocrit: 37.5 % (ref 37.5–51.0)
Hemoglobin: 12.8 g/dL — ABNORMAL LOW (ref 13.0–17.7)
MCH: 30.2 pg (ref 26.6–33.0)
MCHC: 34.1 g/dL (ref 31.5–35.7)
MCV: 88 fL (ref 79–97)
Platelets: 229 10*3/uL (ref 150–450)
RBC: 4.24 x10E6/uL (ref 4.14–5.80)
RDW: 14.4 % (ref 11.6–15.4)
WBC: 6.8 10*3/uL (ref 3.4–10.8)

## 2022-11-23 LAB — COMPREHENSIVE METABOLIC PANEL
ALT: 12 IU/L (ref 0–44)
AST: 19 IU/L (ref 0–40)
Albumin: 4.2 g/dL (ref 3.8–4.9)
Alkaline Phosphatase: 67 IU/L (ref 44–121)
BUN/Creatinine Ratio: 19 (ref 9–20)
BUN: 12 mg/dL (ref 6–24)
Bilirubin Total: 0.3 mg/dL (ref 0.0–1.2)
CO2: 21 mmol/L (ref 20–29)
Calcium: 9 mg/dL (ref 8.7–10.2)
Chloride: 105 mmol/L (ref 96–106)
Creatinine, Ser: 0.63 mg/dL — ABNORMAL LOW (ref 0.76–1.27)
Globulin, Total: 2.7 g/dL (ref 1.5–4.5)
Glucose: 92 mg/dL (ref 70–99)
Potassium: 4.5 mmol/L (ref 3.5–5.2)
Sodium: 140 mmol/L (ref 134–144)
Total Protein: 6.9 g/dL (ref 6.0–8.5)
eGFR: 110 mL/min/{1.73_m2} (ref >59)

## 2022-11-23 LAB — HEMOGLOBIN A1C
Est. average glucose Bld gHb Est-mCnc: 126 mg/dL
Hgb A1c MFr Bld: 6 % — ABNORMAL HIGH (ref 4.8–5.6)

## 2022-11-23 LAB — TSH+FREE T4
Free T4: 1.38 ng/dL (ref 0.82–1.77)
TSH: 0.418 u[IU]/mL — ABNORMAL LOW (ref 0.450–4.500)

## 2022-11-23 LAB — PSA: Prostate Specific Ag, Serum: 0.4 ng/mL (ref 0.0–4.0)

## 2022-11-23 NOTE — Progress Notes (Signed)
Results sent through My Chart  Follow-up in 6 months for med check

## 2022-12-01 ENCOUNTER — Other Ambulatory Visit: Payer: 59

## 2022-12-07 ENCOUNTER — Ambulatory Visit (INDEPENDENT_AMBULATORY_CARE_PROVIDER_SITE_OTHER): Payer: BC Managed Care – PPO | Admitting: Urology

## 2022-12-07 ENCOUNTER — Encounter: Payer: Self-pay | Admitting: Urology

## 2022-12-07 VITALS — BP 131/75 | HR 73 | Ht 68.0 in | Wt 144.0 lb

## 2022-12-07 DIAGNOSIS — R399 Unspecified symptoms and signs involving the genitourinary system: Secondary | ICD-10-CM | POA: Diagnosis not present

## 2022-12-07 LAB — URINALYSIS, ROUTINE W REFLEX MICROSCOPIC
Bilirubin, UA: NEGATIVE
Glucose, UA: NEGATIVE
Ketones, UA: NEGATIVE
Leukocytes,UA: NEGATIVE
Nitrite, UA: NEGATIVE
Protein,UA: NEGATIVE
RBC, UA: NEGATIVE
Specific Gravity, UA: 1.02 (ref 1.005–1.030)
Urobilinogen, Ur: 0.2 mg/dL (ref 0.2–1.0)
pH, UA: 7.5 (ref 5.0–7.5)

## 2022-12-07 NOTE — Progress Notes (Signed)
Assessment: 1. Lower urinary tract symptoms (LUTS)      Plan: Today I had a long discussion with the patient regarding his lower urinary tract symptoms.  Also discussed potential dietary and fluid bladder irritants and gave him some educational material in this regards. Continue tamsulosoin 0.4mg  daily FU prn  Chief Complaint: LUTS  History of Present Illness:  Terry Gross is a 57 y.o. male long history of tobacco smoking and past medical history of carotid artery occlusion, hyperlipidemia, hypertension, impaired fasting blood sugar and peripheral vascular disease who is seen in consultation from Jac Canavan, PA-C for evaluation of LUTS. Patient was recently started on tamsulosin and reports significant improvement.  IPSS=5  Lab review 11/2022-- UA x2 negative PSA = 0.4 Normal renal fxn Cr 0.63  Past Medical History:  Past Medical History:  Diagnosis Date   Aortic atherosclerosis (HCC)    Aortic valve insufficiency 12/15/2014   Moderate by ECHO 12/15/14 normal LV function    Back pain    Carotid artery occlusion    Cataract    Hyperlipidemia    Hypertension    Impaired fasting blood sugar    Left epiretinal membrane 09/12/2019   This condition is resolved after vitrectomy and membrane peel was performed, 02/06/2019.  Topographic distortion completely released.   Loss of balance    Peripheral vascular disease (HCC)    Smoker    Vitreous hemorrhage of left eye (HCC) 06/10/2019    Past Surgical History:  Past Surgical History:  Procedure Laterality Date   CATARACT EXTRACTION     COLONOSCOPY  07/2020   never    Allergies:  No Known Allergies  Family History:  Family History  Problem Relation Age of Onset   CVA Mother    Other Mother        brain tumor   Other Father        complications from prostate sugery   Cancer Father        prostate   Thyroid disease Neg Hx    Heart disease Neg Hx    Diabetes Neg Hx    Kidney disease Neg Hx     Hypertension Neg Hx    Hyperlipidemia Neg Hx     Social History:  Social History   Tobacco Use   Smoking status: Every Day    Current packs/day: 0.25    Average packs/day: 0.3 packs/day for 32.0 years (8.0 ttl pk-yrs)    Types: Cigarettes    Passive exposure: Never   Smokeless tobacco: Never  Vaping Use   Vaping status: Never Used  Substance Use Topics   Alcohol use: No   Drug use: No    Review of symptoms:  Constitutional:  Negative for unexplained weight loss, night sweats, fever, chills ENT:  Negative for nose bleeds, sinus pain, painful swallowing CV:  Negative for chest pain, shortness of breath, exercise intolerance, palpitations, loss of consciousness Resp:  Negative for cough, wheezing, shortness of breath GI:  Negative for nausea, vomiting, diarrhea, bloody stools GU:  Positives noted in HPI; otherwise negative for gross hematuria, dysuria, urinary incontinence Neuro:  Negative for seizures, poor balance, limb weakness, slurred speech Psych:  Negative for lack of energy, depression, anxiety Endocrine:  Negative for polydipsia, polyuria, symptoms of hypoglycemia (dizziness, hunger, sweating) Hematologic:  Negative for anemia, purpura, petechia, prolonged or excessive bleeding, use of anticoagulants  Allergic:  Negative for difficulty breathing or choking as a result of exposure to anything; no shellfish allergy; no allergic response (  rash/itch) to materials, foods  Physical exam: BP 131/75   Pulse 73   Ht 5\' 8"  (1.727 m)   Wt 144 lb (65.3 kg)   BMI 21.90 kg/m  GENERAL APPEARANCE:  Well appearing, well developed, well nourished, NAD  GU: Normal external genitalia DRE: Normal sphincter tone; prostate is small approximately 25 g without evidence of nodules or induration  Results: UA neg

## 2022-12-20 ENCOUNTER — Other Ambulatory Visit: Payer: 59

## 2022-12-28 ENCOUNTER — Emergency Department (HOSPITAL_COMMUNITY)
Admission: EM | Admit: 2022-12-28 | Discharge: 2022-12-28 | Disposition: A | Discharge status: Home / Self Care | Payer: BC Managed Care – PPO | Attending: Emergency Medicine | Admitting: Emergency Medicine

## 2022-12-28 ENCOUNTER — Emergency Department (HOSPITAL_COMMUNITY): Payer: BC Managed Care – PPO

## 2022-12-28 ENCOUNTER — Other Ambulatory Visit: Payer: Self-pay

## 2022-12-28 ENCOUNTER — Encounter (HOSPITAL_COMMUNITY): Payer: Self-pay

## 2022-12-28 DIAGNOSIS — Z1152 Encounter for screening for COVID-19: Secondary | ICD-10-CM | POA: Insufficient documentation

## 2022-12-28 DIAGNOSIS — J439 Emphysema, unspecified: Secondary | ICD-10-CM | POA: Diagnosis not present

## 2022-12-28 DIAGNOSIS — J189 Pneumonia, unspecified organism: Secondary | ICD-10-CM

## 2022-12-28 DIAGNOSIS — R042 Hemoptysis: Secondary | ICD-10-CM | POA: Diagnosis not present

## 2022-12-28 DIAGNOSIS — J168 Pneumonia due to other specified infectious organisms: Secondary | ICD-10-CM | POA: Diagnosis not present

## 2022-12-28 DIAGNOSIS — R059 Cough, unspecified: Secondary | ICD-10-CM | POA: Diagnosis not present

## 2022-12-28 DIAGNOSIS — R739 Hyperglycemia, unspecified: Secondary | ICD-10-CM | POA: Insufficient documentation

## 2022-12-28 DIAGNOSIS — Z7902 Long term (current) use of antithrombotics/antiplatelets: Secondary | ICD-10-CM | POA: Diagnosis not present

## 2022-12-28 DIAGNOSIS — I2510 Atherosclerotic heart disease of native coronary artery without angina pectoris: Secondary | ICD-10-CM | POA: Diagnosis not present

## 2022-12-28 LAB — CBC WITH DIFFERENTIAL/PLATELET
Abs Immature Granulocytes: 0.02 10*3/uL (ref 0.00–0.07)
Basophils Absolute: 0 10*3/uL (ref 0.0–0.1)
Basophils Relative: 0 %
Eosinophils Absolute: 0 10*3/uL (ref 0.0–0.5)
Eosinophils Relative: 1 %
HCT: 37.3 % — ABNORMAL LOW (ref 39.0–52.0)
Hemoglobin: 12.1 g/dL — ABNORMAL LOW (ref 13.0–17.0)
Immature Granulocytes: 0 %
Lymphocytes Relative: 29 %
Lymphs Abs: 2 10*3/uL (ref 0.7–4.0)
MCH: 29.4 pg (ref 26.0–34.0)
MCHC: 32.4 g/dL (ref 30.0–36.0)
MCV: 90.8 fL (ref 80.0–100.0)
Monocytes Absolute: 0.4 10*3/uL (ref 0.1–1.0)
Monocytes Relative: 6 %
Neutro Abs: 4.3 10*3/uL (ref 1.7–7.7)
Neutrophils Relative %: 64 %
Platelets: 220 10*3/uL (ref 150–400)
RBC: 4.11 MIL/uL — ABNORMAL LOW (ref 4.22–5.81)
RDW: 15.5 % (ref 11.5–15.5)
WBC: 6.8 10*3/uL (ref 4.0–10.5)
nRBC: 0 % (ref 0.0–0.2)

## 2022-12-28 LAB — BASIC METABOLIC PANEL
Anion gap: 14 (ref 5–15)
BUN: 9 mg/dL (ref 6–20)
CO2: 23 mmol/L (ref 22–32)
Calcium: 9.3 mg/dL (ref 8.9–10.3)
Chloride: 102 mmol/L (ref 98–111)
Creatinine, Ser: 0.84 mg/dL (ref 0.61–1.24)
GFR, Estimated: >60 mL/min (ref >60)
Glucose, Bld: 134 mg/dL — ABNORMAL HIGH (ref 70–99)
Potassium: 4 mmol/L (ref 3.5–5.1)
Sodium: 139 mmol/L (ref 135–145)

## 2022-12-28 LAB — RESP PANEL BY RT-PCR (RSV, FLU A&B, COVID)  RVPGX2
Influenza A by PCR: NEGATIVE
Influenza B by PCR: NEGATIVE
Resp Syncytial Virus by PCR: NEGATIVE
SARS Coronavirus 2 by RT PCR: NEGATIVE

## 2022-12-28 MED ORDER — IOHEXOL 350 MG/ML SOLN
75.0000 mL | Freq: Once | INTRAVENOUS | Status: AC | PRN
  Administered 2022-12-28: 75 mL via INTRAVENOUS

## 2022-12-28 MED ORDER — AZITHROMYCIN 250 MG PO TABS: 250.0000 mg | ORAL_TABLET | Freq: Every day | ORAL | 0 refills | Status: DC

## 2022-12-28 MED ORDER — BENZONATATE 100 MG PO CAPS: 100.0000 mg | ORAL_CAPSULE | Freq: Three times a day (TID) | ORAL | 0 refills | Status: DC

## 2022-12-28 MED ORDER — AMOXICILLIN-POT CLAVULANATE 875-125 MG PO TABS
1.0000 | ORAL_TABLET | Freq: Two times a day (BID) | ORAL | 0 refills | Status: DC
Start: 2022-12-28 — End: 2023-06-05

## 2022-12-28 NOTE — ED Provider Notes (Signed)
Patient given in sign out by Achille Rich, PA-C.  Please review their note for patient HPI, physical exam, workup.  At this time the plan is follow-up on CTA chest to rule out PE.  If negative treat for pneumonia with primary care follow-up.  CTA came back showing left lower lobe pneumonia.  I spoke to the patient and informed him of these findings and that I was going to place him on antibiotics and have him follow-up with a primary care provider.  Due to patient being high risk patient will be placed on Augmentin and a Z-Pak and given Tessalon for his cough as I suspect some office this is due to coughing from his pneumonia as opposed to life-threatening etiology as patient does appear clinically well.  Patient verbalizes understanding acceptance of this plan.  Patient given strict return precautions and stable for discharge.    Netta Corrigan, PA-C 12/28/22 1844    Tegeler, Canary Brim, MD 12/29/22 [phone]

## 2022-12-28 NOTE — ED Triage Notes (Addendum)
Pt states he has some dark red blood spots in mucous int started on Monday. Pt denies any other sx. Pt states not coughing all the time, just the times he does cough

## 2022-12-28 NOTE — Discharge Instructions (Addendum)
Please follow-up with your primary care provider regarding your symptoms and ER visit.  I have prescribed for you antibiotics to take as your imaging shows that you have pneumonia causing your symptoms.  I have also prescribed for your cough medicine as well called Tessalon.  If symptoms change or worsen please return to ER.

## 2022-12-28 NOTE — ED Notes (Signed)
Pt A&Ox4 ambulatory at d/c with independent steady gait. Pt verbalized understanding of d/c instructions, prescriptions and follow up care. 

## 2022-12-28 NOTE — ED Provider Notes (Signed)
[place] EMERGENCY DEPARTMENT AT [facility] Provider Note   CSN: [id number] Arrival date & time: 12/28/22  1157     History Chief Complaint  Patient presents with   Hemoptysis    Terry Gross is a 57 y.o. male with h/o HLD presents to the ER for evaluation of occasional cough with small streaks of blood.  Patient reports he noticed his symptoms on Monday, 2 days ago.  Reports that he was coughing smaller amounts of blood since then.  These episodes only lasting a few times a day.  He reports fatigue with past week.  Denies any rhinorrhea, nasal congestion, sore throat, fevers, chills, chest pain, shortness of breath, or unintentional weight loss.  He denies any recent travel.  Denies any drug use.  He is a daily tobacco user.  He is on Plavix but denies any anticoagulation use.  He denies any leg swelling or any shortness of breath on exertion.    HPI     Home Medications Prior to Admission medications   Medication Sig Start Date End Date Taking? Authorizing Provider  buPROPion ER (WELLBUTRIN SR) 100 MG 12 hr tablet Take 1 tablet (100 mg total) by mouth 2 (two) times daily. 11/07/22   Patwardhan, Anabel Bene, MD  clopidogrel (PLAVIX) 75 MG tablet Take 1 tablet (75 mg total) by mouth daily. 11/17/22   Tysinger, [name], PA-C  dorzolamide-timolol (COSOPT) 2-0.5 % ophthalmic solution Place 1 drop into the left eye 2 (two) times daily. 04/08/22   [provider]  Fluocinolone Acetonide 0.01 % OIL Place 1 Application in ear(s) daily. 05/12/22   Tysinger, [name], PA-C  latanoprost (XALATAN) 0.005 % ophthalmic solution INSTILL 1 DROP IN LEFT EYE NIGHTLY 04/06/21   Rankin, Alford Highland, MD  Omega-3 Fatty Acids (FISH OIL) 1200 MG CAPS Take by mouth daily.    [provider]  rosuvastatin (CRESTOR) 40 MG tablet Take 1 tablet (40 mg total) by mouth daily. 11/17/22   Tysinger, [name], PA-C  tamsulosin (FLOMAX) 0.4 MG CAPS capsule Take 1 capsule (0.4 mg total) by mouth  daily after supper. 11/03/22   Tysinger, [name], PA-C      Allergies    Patient has no known allergies.    Review of Systems   Review of Systems  Constitutional:  Positive for fatigue. Negative for chills, fever and unexpected weight change.  HENT:  Negative for congestion and rhinorrhea.   Respiratory:  Positive for cough. Negative for chest tightness and shortness of breath.   Cardiovascular:  Negative for chest pain.  Gastrointestinal:  Negative for abdominal pain, nausea and vomiting.    Physical Exam Updated Vital Signs BP (!) 140/87   Pulse 92   Temp 98.9 F (37.2 C)   Resp 16   Ht 5\' 8"  (1.727 m)   Wt 65.3 kg   SpO2 98%   BMI 21.89 kg/m  Physical Exam Vitals and nursing note reviewed.  Constitutional:      General: He is not in acute distress.    Appearance: Normal appearance. He is not ill-appearing or toxic-appearing.  HENT:     Mouth/Throat:     Mouth: Mucous membranes are moist.  Eyes:     General: No scleral icterus. Cardiovascular:     Rate and Rhythm: Normal rate.  Pulmonary:     Effort: Pulmonary effort is normal. No respiratory distress.     Breath sounds: Normal breath sounds. No wheezing, rhonchi or rales.  Musculoskeletal:  Right lower leg: No edema.     Left lower leg: No edema.  Skin:    General: Skin is warm and dry.  Neurological:     General: No focal deficit present.     Mental Status: He is alert. Mental status is at baseline.  Psychiatric:        Mood and Affect: Mood normal.     ED Results / Procedures / Treatments   Labs (all labs ordered are listed, but only abnormal results are displayed) Labs Reviewed  BASIC METABOLIC PANEL - Abnormal; Notable for the following components:      Result Value   Glucose, Bld 134 (*)    All other components within normal limits  CBC WITH DIFFERENTIAL/PLATELET - Abnormal; Notable for the following components:   RBC 4.11 (*)    Hemoglobin 12.1 (*)    HCT 37.3 (*)    All other components  within normal limits  RESP PANEL BY RT-PCR (RSV, FLU A&B, COVID)  RVPGX2    EKG None  Radiology DG Chest 2 View  Result Date: 12/28/2022 CLINICAL DATA:  cough EXAM: CHEST - 2 VIEW COMPARISON:  CT scan chest from 09/19/2019. FINDINGS: There are heterogeneous alveolar and interstitial opacities overlying the left mid lower lung zones, compatible with pneumonia predominantly involving the left lower lobe. Bilateral lung fields are otherwise clear. Bilateral costophrenic angles are clear. Normal cardio-mediastinal silhouette. No acute osseous abnormalities. The soft tissues are within normal limits. IMPRESSION: *Left lower lobe pneumonia.  Follow-up to clearing is recommended. Electronically Signed   By: Jules Schick M.D.   On: 12/28/2022 13:43    Procedures Procedures   Medications Ordered in ED Medications  iohexol (OMNIPAQUE) 350 MG/ML injection 75 mL (75 mLs Intravenous Contrast Given 12/28/22 1623)    ED Course/ Medical Decision Making/ A&P   Medical Decision Making Amount and/or Complexity of Data Reviewed Labs: ordered. Radiology: ordered.  Risk Prescription drug management.   57 y.o. male presents to the ER for evaluation of coughing with blood streak. Differential diagnosis includes but is not limited to bronchitis, pneumonia, lung abscess, tuberculosis, neoplasm, PE. Vital signs mildly elevated blood pressure otherwise unremarkable. Physical exam as noted above.   I independently reviewed and interpreted the patient's labs.  Hemoglobin at 12.1 appears to be slightly worse than patient's globin previously.  No [place] ptosis.  BMP shows mildly elevated glucose at 134 without other electrolyte abnormalities.  Negative COVID, flu, RSV.  Chest x-ray shows Left lower lobe pneumonia.  Follow-up to clearing is recommended. Per radiology read.  Given patient's hemoptysis as well as pneumonia seen on x-ray, will order CT PE study to rule out any pulmonary infarct from possible PE.   Patient's not tachycardic or tachypneic or hypoxic, however concern for possible malignancy given the patient's tobacco use as well.  Portions of this report may have been transcribed using voice recognition software. Every effort was made to ensure accuracy; however, inadvertent computerized transcription errors may be present.   4:36 PM Care of Shigeru Agredano  transferred to PA Evlyn Kanner at the end of my shift as the patient will require reassessment once labs/imaging have resulted. Patient presentation, ED course, and plan of care discussed with review of all pertinent labs and imaging. Please see his/her note for further details regarding further ED course and disposition. Plan at time of handoff is follow up with CT PE study. This may be altered or completely changed at the discretion of the oncoming team pending results  of further workup.   Final Clinical Impression(s) / ED Diagnoses Final diagnoses:  None    Rx / DC Orders ED Discharge Orders     None         Achille Rich, PA-C 12/28/22 1652    Tegeler, Canary Brim, MD 12/29/22 [phone]

## 2023-01-04 ENCOUNTER — Other Ambulatory Visit: Payer: 59

## 2023-01-11 ENCOUNTER — Other Ambulatory Visit: Payer: Self-pay

## 2023-01-11 ENCOUNTER — Emergency Department (HOSPITAL_COMMUNITY): Payer: BC Managed Care – PPO

## 2023-01-11 ENCOUNTER — Emergency Department (HOSPITAL_COMMUNITY)
Admission: EM | Admit: 2023-01-11 | Discharge: 2023-01-11 | Disposition: A | Discharge status: Home / Self Care | Payer: BC Managed Care – PPO | Attending: Emergency Medicine | Admitting: Emergency Medicine

## 2023-01-11 ENCOUNTER — Encounter (HOSPITAL_COMMUNITY): Payer: Self-pay

## 2023-01-11 DIAGNOSIS — Z7902 Long term (current) use of antithrombotics/antiplatelets: Secondary | ICD-10-CM | POA: Insufficient documentation

## 2023-01-11 DIAGNOSIS — R051 Acute cough: Secondary | ICD-10-CM

## 2023-01-11 DIAGNOSIS — Z79899 Other long term (current) drug therapy: Secondary | ICD-10-CM | POA: Insufficient documentation

## 2023-01-11 DIAGNOSIS — I10 Essential (primary) hypertension: Secondary | ICD-10-CM | POA: Diagnosis not present

## 2023-01-11 DIAGNOSIS — R042 Hemoptysis: Secondary | ICD-10-CM | POA: Insufficient documentation

## 2023-01-11 LAB — BASIC METABOLIC PANEL
Anion gap: 8 (ref 5–15)
BUN: 10 mg/dL (ref 6–20)
CO2: 23 mmol/L (ref 22–32)
Calcium: 8.9 mg/dL (ref 8.9–10.3)
Chloride: 108 mmol/L (ref 98–111)
Creatinine, Ser: 0.69 mg/dL (ref 0.61–1.24)
GFR, Estimated: >60 mL/min (ref >60)
Glucose, Bld: 120 mg/dL — ABNORMAL HIGH (ref 70–99)
Potassium: 3.9 mmol/L (ref 3.5–5.1)
Sodium: 139 mmol/L (ref 135–145)

## 2023-01-11 LAB — CBC
HCT: 38.1 % — ABNORMAL LOW (ref 39.0–52.0)
Hemoglobin: 12.2 g/dL — ABNORMAL LOW (ref 13.0–17.0)
MCH: 28.9 pg (ref 26.0–34.0)
MCHC: 32 g/dL (ref 30.0–36.0)
MCV: 90.3 fL (ref 80.0–100.0)
Platelets: 230 10*3/uL (ref 150–400)
RBC: 4.22 MIL/uL (ref 4.22–5.81)
RDW: 15 % (ref 11.5–15.5)
WBC: 7.3 10*3/uL (ref 4.0–10.5)
nRBC: 0 % (ref 0.0–0.2)

## 2023-01-11 MED ORDER — BENZONATATE 100 MG PO CAPS
100.0000 mg | ORAL_CAPSULE | Freq: Once | ORAL | Status: AC
  Administered 2023-01-11: 100 mg via ORAL
  Filled 2023-01-11: qty 1

## 2023-01-11 MED ORDER — BENZONATATE 100 MG PO CAPS: 100.0000 mg | ORAL_CAPSULE | Freq: Three times a day (TID) | ORAL | 0 refills | Status: DC

## 2023-01-11 NOTE — ED Provider Notes (Signed)
[place] EMERGENCY DEPARTMENT AT [facility] Provider Note   CSN: [id number] Arrival date & time: 01/11/23  1116     History  Chief Complaint  Patient presents with   Bloody Sputum   Cough    Terry Gross is a 57 y.o. male with a past medical history significant for hypertension, hyperlipidemia, and peripheral vascular disease who presents to the ED due to intermittent hemoptysis.  Patient recently treated for pneumonia on 8/21.  He notes he is still coughing slightly with specks of blood.  He notes hemoptysis is improving.  Denies chest pain and shortness of breath.  Had a CTA chest on 8/21 which was negative for PE but did show pneumonia.  Patient finished all of his antibiotics.  No fever or chills.  Not currently taking any cough medication.  No lower extremity edema.  Denies history of blood clots.  Only on Plavix however, no other blood thinners.  No other complaints.  History obtained from patient and past medical records. No interpreter used during encounter.       Home Medications Prior to Admission medications   Medication Sig Start Date End Date Taking? Authorizing Provider  benzonatate (TESSALON) 100 MG capsule Take 1 capsule (100 mg total) by mouth every 8 (eight) hours. 01/11/23  Yes Arney Mayabb C, PA-C  amoxicillin-clavulanate (AUGMENTIN) 875-125 MG tablet Take 1 tablet by mouth every 12 (twelve) hours. 12/28/22   Netta Corrigan, PA-C  azithromycin (ZITHROMAX) 250 MG tablet Take 1 tablet (250 mg total) by mouth daily. Take first 2 tablets together, then 1 every day until finished. 12/28/22   Netta Corrigan, PA-C  benzonatate (TESSALON) 100 MG capsule Take 1 capsule (100 mg total) by mouth every 8 (eight) hours. 12/28/22   Netta Corrigan, PA-C  buPROPion ER (WELLBUTRIN SR) 100 MG 12 hr tablet Take 1 tablet (100 mg total) by mouth 2 (two) times daily. 11/07/22   Patwardhan, Anabel Bene, MD  clopidogrel (PLAVIX) 75 MG tablet Take 1 tablet (75 mg total)  by mouth daily. 11/17/22   Tysinger, [name], PA-C  dorzolamide-timolol (COSOPT) 2-0.5 % ophthalmic solution Place 1 drop into the left eye 2 (two) times daily. 04/08/22   [provider]  Fluocinolone Acetonide 0.01 % OIL Place 1 Application in ear(s) daily. 05/12/22   Tysinger, [name], PA-C  latanoprost (XALATAN) 0.005 % ophthalmic solution INSTILL 1 DROP IN LEFT EYE NIGHTLY 04/06/21   Rankin, Alford Highland, MD  Omega-3 Fatty Acids (FISH OIL) 1200 MG CAPS Take by mouth daily.    [provider]  rosuvastatin (CRESTOR) 40 MG tablet Take 1 tablet (40 mg total) by mouth daily. 11/17/22   Tysinger, [name], PA-C  tamsulosin (FLOMAX) 0.4 MG CAPS capsule Take 1 capsule (0.4 mg total) by mouth daily after supper. 11/03/22   Tysinger, [name], PA-C      Allergies    Patient has no known allergies.    Review of Systems   Review of Systems  Constitutional:  Negative for fever.  Respiratory:  Positive for cough. Negative for shortness of breath.   Cardiovascular:  Negative for chest pain.    Physical Exam Updated Vital Signs BP 116/62 (BP Location: Right Arm)   Pulse 74   Temp 97.7 F (36.5 C) (Oral)   Resp (!) 21   SpO2 100%  Physical Exam Vitals and nursing note reviewed.  Constitutional:      General: He is not in acute distress.    Appearance:  He is not ill-appearing.  HENT:     Head: Normocephalic.  Eyes:     Pupils: Pupils are equal, round, and reactive to light.  Cardiovascular:     Rate and Rhythm: Normal rate and regular rhythm.     Pulses: Normal pulses.     Heart sounds: Normal heart sounds. No murmur heard.    No friction rub. No gallop.  Pulmonary:     Effort: Pulmonary effort is normal.     Breath sounds: Normal breath sounds.     Comments: Respirations equal and unlabored, patient able to speak in full sentences, lungs clear to auscultation bilaterally Abdominal:     General: Abdomen is flat. There is no distension.     Palpations: Abdomen is soft.      Tenderness: There is no abdominal tenderness. There is no guarding or rebound.  Musculoskeletal:        General: Normal range of motion.     Cervical back: Neck supple.     Comments: No lower extremity edema. Negative homan sign  Skin:    General: Skin is warm and dry.  Neurological:     General: No focal deficit present.     Mental Status: He is alert.  Psychiatric:        Mood and Affect: Mood normal.        Behavior: Behavior normal.     ED Results / Procedures / Treatments   Labs (all labs ordered are listed, but only abnormal results are displayed) Labs Reviewed  BASIC METABOLIC PANEL - Abnormal; Notable for the following components:      Result Value   Glucose, Bld 120 (*)    All other components within normal limits  CBC - Abnormal; Notable for the following components:   Hemoglobin 12.2 (*)    HCT 38.1 (*)    All other components within normal limits    EKG None  Radiology DG Chest 2 View  Result Date: 01/11/2023 CLINICAL DATA:  Bloody sputum EXAM: CHEST - 2 VIEW COMPARISON:  12/28/22 CXR FINDINGS: No pleural effusion. No pneumothorax. No focal airspace opacity. Normal cardiac and mediastinal contours. No radiographically apparent displaced rib fractures. Visualized upper abdomen unremarkable. IMPRESSION: No focal airspace opacity. No radiographic etiology for bloody sputum identified. Electronically Signed   By: Lorenza Cambridge M.D.   On: 01/11/2023 13:04    Procedures Procedures    Medications Ordered in ED Medications  benzonatate (TESSALON) capsule 100 mg (100 mg Oral Given 01/11/23 1350)    ED Course/ Medical Decision Making/ A&P Clinical Course as of 01/11/23 1434  Wed Jan 11, 2023  1329 Hemoglobin(!): 12.2 [CA]  1329 Glucose(!): 120 [CA]    Clinical Course User Index [CA] Mannie Stabile, PA-C                                 Medical Decision Making Amount and/or Complexity of Data Reviewed External Data Reviewed: notes. Labs: ordered.  Decision-making details documented in ED Course. Radiology: ordered and independent interpretation performed. Decision-making details documented in ED Course. ECG/medicine tests: ordered and independent interpretation performed. Decision-making details documented in ED Course.  Risk Prescription drug management.   This patient presents to the ED for concern of hemoptysis, this involves an extensive number of treatment options, and is a complaint that carries with it a high risk of complications and morbidity.  The differential diagnosis includes bronchitis, PNA, PE, etc  57 year old male presents to the ED due to intermittent hemoptysis x 1 week.  Treated for pneumonia on 8/21 where he finished antibiotics.  He notes persistent cough with intermittent hemoptysis.  Notes hemoptysis has now improved and is only small specks of blood.  Denies chest pain and shortness of breath.  Had a CTA chest on 8/21 which was negative for PE.  No lower extremity edema.  Upon arrival patient afebrile, not tachycardic or hypoxic.  Patient in no acute distress.  Reassuring physical exam.  Lungs clear to auscultation bilaterally.  No lower extremity edema.  Negative Homan sign bilaterally.  Routine labs ordered at triage.  Low suspicion for PE given recent negative CTA chest.  Suspect symptoms secondary to persistent cough.  Will treat with Tessalon Perles.  CBC reassuring.  No leukocytosis.  Hemoglobin at 12.2.  BMP reassuring.  Normal renal function.  No major electrolyte derangements.  Hyperglycemia 120.  No anion gap. Low suspicion for PE. Chest x-ray personally reviewed and interpreted which is negative for signs of pneumonia, pneumothorax, or widened mediastinum. EKG NSR with no signs of acute ischemia. Low suspicion for ACS.  Suspect hemoptysis likely secondary to bronchitis.  Patient discharged with cough medication.  Low suspicion for PE given reassuring CTA chest 2 weeks ago.  Patient stable for discharge. Strict  ED precautions discussed with patient. Patient states understanding and agrees to plan. Patient discharged home in no acute distress and stable vitals  Lives at home Has PCP Hx HTN, hyperlipidemia        Final Clinical Impression(s) / ED Diagnoses Final diagnoses:  Acute cough    Rx / DC Orders ED Discharge Orders          Ordered    benzonatate (TESSALON) 100 MG capsule  Every 8 hours        01/11/23 1341              Jesusita Oka 01/11/23 1436    Eber Hong, MD 01/12/23 [phone]

## 2023-01-11 NOTE — ED Triage Notes (Signed)
Pt came in via POV d/t coughing up blood several times since he has finished ABT. A/Ox4, denies CP, difficulty breathing or fevers. Reports since he finished his ABT course that was for his bacterial infection in lower Lt lung (per pt) the spots of blood has not stopped since finishing ABT but it has lessened.

## 2023-01-11 NOTE — ED Provider Triage Note (Signed)
Emergency Medicine Provider Triage Evaluation Note  Terry Gross , a 57 y.o. male  was evaluated in triage.  Pt complains of hemoptysis.  Review of Systems  Positive:  Negative:   Physical Exam  BP 116/62 (BP Location: Right Arm)   Pulse 74   Temp 97.7 F (36.5 C) (Oral)   Resp (!) 21   SpO2 100%  Gen:   Awake, no distress   Resp:  Normal effort  MSK:   Moves extremities without difficulty  Other:    Medical Decision Making  Medically screening exam initiated at 12:49 PM.  Appropriate orders placed.  Terry Gross was informed that the remainder of the evaluation will be completed by another provider, this initial triage assessment does not replace that evaluation, and the importance of remaining in the ED until their evaluation is complete.  Patient with PNA 2 weeks ago. Finished ABX course 7 days ago. Patient concerned because he still has some intermittent hemoptysis. Patient showing pictures on phone of his bloody sputum. It appears that hemoptysis is resolving overtime.  Denies fever, chest pain, dyspnea, nausea, vomiting, diarrhea.    Dorthy Cooler, [place] 01/11/23 1253

## 2023-01-11 NOTE — Discharge Instructions (Addendum)
It was a pleasure taking care of you today. As discussed, your labs were reassuring. Chest x-ray did not show evidence of pneumonia. I suspect the blood is likely from your cough. Continue taking your cough medication as prescribed. Follow-up with PCP within 2-3 days for recheck. Return to the ER for new or worsening symptoms.

## 2023-01-13 ENCOUNTER — Ambulatory Visit: Payer: 59

## 2023-01-13 DIAGNOSIS — I350 Nonrheumatic aortic (valve) stenosis: Secondary | ICD-10-CM

## 2023-01-13 DIAGNOSIS — I351 Nonrheumatic aortic (valve) insufficiency: Secondary | ICD-10-CM | POA: Diagnosis not present

## 2023-01-16 NOTE — Progress Notes (Signed)
Mild increase in aortic valve narrowing, still in moderate severity. There is also moderate to severe narrowing of aortic valve, unchanged since previous echo. Keep echo and follow up appt in June-July 2025.  Thanks MJP

## 2023-01-16 NOTE — Progress Notes (Signed)
Called patient to inform him about his echo results. Patient understood.

## 2023-02-14 ENCOUNTER — Other Ambulatory Visit: Payer: Self-pay | Admitting: Medical

## 2023-02-14 DIAGNOSIS — H348121 Central retinal vein occlusion, left eye, with retinal neovascularization: Secondary | ICD-10-CM | POA: Diagnosis not present

## 2023-02-15 ENCOUNTER — Other Ambulatory Visit: Payer: Self-pay

## 2023-02-15 MED ORDER — ROSUVASTATIN CALCIUM 40 MG PO TABS: 40.0000 mg | ORAL_TABLET | Freq: Every day | ORAL | 0 refills | Status: DC

## 2023-02-28 DIAGNOSIS — H33321 Round hole, right eye: Secondary | ICD-10-CM | POA: Diagnosis not present

## 2023-03-15 DIAGNOSIS — H348121 Central retinal vein occlusion, left eye, with retinal neovascularization: Secondary | ICD-10-CM | POA: Diagnosis not present

## 2023-04-12 DIAGNOSIS — H348121 Central retinal vein occlusion, left eye, with retinal neovascularization: Secondary | ICD-10-CM | POA: Diagnosis not present

## 2023-05-16 ENCOUNTER — Other Ambulatory Visit: Payer: Self-pay | Admitting: Medical

## 2023-05-17 NOTE — Telephone Encounter (Signed)
 Has an appt 05/26/2023

## 2023-05-23 DIAGNOSIS — H348121 Central retinal vein occlusion, left eye, with retinal neovascularization: Secondary | ICD-10-CM | POA: Diagnosis not present

## 2023-05-26 ENCOUNTER — Encounter: Payer: BC Managed Care – PPO | Admitting: Medical

## 2023-05-31 ENCOUNTER — Encounter: Payer: BC Managed Care – PPO | Admitting: Medical

## 2023-06-02 DIAGNOSIS — H4052X3 Glaucoma secondary to other eye disorders, left eye, severe stage: Secondary | ICD-10-CM | POA: Diagnosis not present

## 2023-06-02 DIAGNOSIS — H348121 Central retinal vein occlusion, left eye, with retinal neovascularization: Secondary | ICD-10-CM | POA: Diagnosis not present

## 2023-06-05 ENCOUNTER — Ambulatory Visit (INDEPENDENT_AMBULATORY_CARE_PROVIDER_SITE_OTHER): Payer: Medicaid Other | Admitting: Medical

## 2023-06-05 VITALS — BP 110/64 | HR 85 | Wt 144.8 lb

## 2023-06-05 DIAGNOSIS — Z7185 Encounter for immunization safety counseling: Secondary | ICD-10-CM

## 2023-06-05 DIAGNOSIS — I739 Peripheral vascular disease, unspecified: Secondary | ICD-10-CM

## 2023-06-05 DIAGNOSIS — I8393 Asymptomatic varicose veins of bilateral lower extremities: Secondary | ICD-10-CM

## 2023-06-05 DIAGNOSIS — H3522 Other non-diabetic proliferative retinopathy, left eye: Secondary | ICD-10-CM

## 2023-06-05 DIAGNOSIS — R0989 Other specified symptoms and signs involving the circulatory and respiratory systems: Secondary | ICD-10-CM

## 2023-06-05 DIAGNOSIS — L853 Xerosis cutis: Secondary | ICD-10-CM

## 2023-06-05 DIAGNOSIS — R7989 Other specified abnormal findings of blood chemistry: Secondary | ICD-10-CM | POA: Diagnosis not present

## 2023-06-05 DIAGNOSIS — E782 Mixed hyperlipidemia: Secondary | ICD-10-CM

## 2023-06-05 DIAGNOSIS — I6522 Occlusion and stenosis of left carotid artery: Secondary | ICD-10-CM

## 2023-06-05 DIAGNOSIS — R042 Hemoptysis: Secondary | ICD-10-CM | POA: Diagnosis not present

## 2023-06-05 DIAGNOSIS — Z720 Tobacco use: Secondary | ICD-10-CM

## 2023-06-05 DIAGNOSIS — H348121 Central retinal vein occlusion, left eye, with retinal neovascularization: Secondary | ICD-10-CM

## 2023-06-05 DIAGNOSIS — R7301 Impaired fasting glucose: Secondary | ICD-10-CM | POA: Diagnosis not present

## 2023-06-05 MED ORDER — CLOPIDOGREL BISULFATE 75 MG PO TABS: 75.0000 mg | ORAL_TABLET | Freq: Once | ORAL | 3 refills | Status: AC

## 2023-06-05 MED ORDER — AMOXICILLIN 875 MG PO TABS
875.0000 mg | ORAL_TABLET | Freq: Two times a day (BID) | ORAL | 0 refills | Status: AC
Start: 2023-06-05 — End: 2023-06-15

## 2023-06-05 MED ORDER — TAMSULOSIN HCL 0.4 MG PO CAPS: 0.4000 mg | ORAL_CAPSULE | Freq: Every day | ORAL | 3 refills | Status: AC

## 2023-06-05 MED ORDER — ROSUVASTATIN CALCIUM 40 MG PO TABS: 40.0000 mg | ORAL_TABLET | Freq: Every day | ORAL | 3 refills | Status: AC

## 2023-06-05 NOTE — Patient Instructions (Addendum)
Dry skin of your ears You can either use Eucerin cream or Cetaphil cream or Aquaphor cream or Vaseline once or twice daily on the inside of your outer ear where you are having dry skin.  It does not look very bad today  Nasal mucus and some blood-tinged mucus Begin over-the-counter Mucinex once or twice a day for the next for 5 days to see if that helps and/or start doing some nasal saline flush with a teaspoon of salt and warm water such as a couple water daily in the morning in the shower to flush the nostrils and flush out mucus If you do not see improvements in the next 4-5 days, then begin Amoxicillin  Expect phone call about doing leg blood flow screen ABI and ultrasound of the abdomen for aneurysm screening  Continue current medicaiton  Try to quit smoking completley   Shingles vaccine:  I recommend you have a shingles vaccine to help prevent shingles or herpes zoster outbreak.   Please call your insurer to inquire about coverage for the Shingrix vaccine given in 2 doses.   Some insurers cover this vaccine after age 16, some cover this after age 64.  If your insurer covers this, then call to schedule appointment to have this vaccine here.

## 2023-06-05 NOTE — Progress Notes (Signed)
Subjective:   HPI  Terry Gross is a 58 y.o. male who presents for Chief Complaint  Patient presents with   Medical Management of Chronic Issues    Med check, no concerns. Declines flu and covid shot    Patient Care Team: Dshawn Mcnay, [name], PA-C as PCP - General (Family Medicine) Elder Negus, MD as Consulting Physician (Cardiology) Luciana Axe Alford Highland, MD as Consulting Physician (Ophthalmology) Lars Mage, PA-C as Physician Assistant (Physician Assistant), vascular Has dentures, doesn't see dentist Dr. Gretta Began, vascular surgery Dr. Jethro Bolus, ophthalmology [facility] dermatology   Concerns: Here for 6 month follow up  Not smoking much now.    Been dealing with eye laser surgeries and treatment.  Compliant with medications for cholesterol and Plavix.  Doing fine on Flomax for BPH  Has ongoing dry skin of the ears  Lately has been dealing with some residual mucus fairly frequently in the mornings can be blood-tinged at that time.  This happens quite frequently in the last week or 2  Reviewed their medical, surgical, family, social, medication, and allergy history and updated chart as appropriate.  Past Medical History:  Diagnosis Date   Aortic atherosclerosis (HCC)    Aortic valve insufficiency 12/15/2014   Moderate by ECHO 12/15/14 normal LV function    Back pain    Carotid artery occlusion    Cataract    Hyperlipidemia    Hypertension    Impaired fasting blood sugar    Left epiretinal membrane 09/12/2019   This condition is resolved after vitrectomy and membrane peel was performed, 02/06/2019.  Topographic distortion completely released.   Loss of balance    Peripheral vascular disease (HCC)    Smoker    Vitreous hemorrhage of left eye (HCC) 06/10/2019     Current Outpatient Medications:    amoxicillin (AMOXIL) 875 MG tablet, Take 1 tablet (875 mg total) by mouth 2 (two) times daily for 10 days., Disp: 20 tablet, Rfl: 0   dorzolamide-timolol  (COSOPT) 2-0.5 % ophthalmic solution, Place 1 drop into the left eye 2 (two) times daily., Disp: , Rfl:    latanoprost (XALATAN) 0.005 % ophthalmic solution, INSTILL 1 DROP IN LEFT EYE NIGHTLY, Disp: 2.5 mL, Rfl: 3   Omega-3 Fatty Acids (FISH OIL) 1200 MG CAPS, Take by mouth daily., Disp: , Rfl:    clopidogrel (PLAVIX) 75 MG tablet, Take 1 tablet (75 mg total) by mouth once for 1 dose., Disp: 90 tablet, Rfl: 3   rosuvastatin (CRESTOR) 40 MG tablet, Take 1 tablet (40 mg total) by mouth daily., Disp: 90 tablet, Rfl: 3   tamsulosin (FLOMAX) 0.4 MG CAPS capsule, Take 1 capsule (0.4 mg total) by mouth daily after supper., Disp: 90 capsule, Rfl: 3  No Known Allergies   ROS as in subjective      Objective:  BP 110/64   Pulse 85   Wt 144 lb 12.8 oz (65.7 kg)   BMI 22.02 kg/m    BP Readings from Last 3 Encounters:  06/05/23 110/64  01/11/23 116/62  12/28/22 (!) 140/87   Wt Readings from Last 3 Encounters:  06/05/23 144 lb 12.8 oz (65.7 kg)  12/28/22 143 lb 15.4 oz (65.3 kg)  12/07/22 144 lb (65.3 kg)    General appearance: alert, no distress, WD/WN, Caucasian male Skin: Mild dry skin of the internal portion of the external ear pinna bilaterally HEENT: normocephalic, conjunctiva/corneas normal, sclerae anicteric, PERRLA, EOMi, nares with some mucoid discharge, mild erythema, pharynx normal Oral cavity: MMM,  tongue normal, edentulous Neck: supple, no lymphadenopathy, no thyromegaly, no masses, normal ROM, left carotid bruit Chest: non tender, normal shape and expansion Heart: 2/6 holosystolic murmur in upper sternal borders,  RRR, normal S1, S2, no murmurs Lungs: CTA bilaterally, no wheezes, rhonchi, or rales Extremities: no edema, no cyanosis, no clubbing Pulses: 2+ symmetric, upper  extremities, normal cap refill, decreased pulses in extremities chronically consistent with vascular disease    Assessment and Plan :   Encounter Diagnoses  Name Primary?   Dry skin Yes    Blood-tinged sputum    PVD (peripheral vascular disease) (HCC)    Occlusion of left carotid artery    Nondiabetic proliferative retinopathy, left    Impaired fasting blood sugar    Mixed hyperlipidemia    Central retinal vein occlusion, left eye, with retinal neovascularization    Tobacco abuse    Vaccine counseling    Varicose veins of both lower extremities, unspecified whether complicated    Abnormal thyroid blood test    Decreased pedal pulses      Vaccination recommendations were reviewed Immunization History  Administered Date(s) Administered   Influenza,inj,Quad PF,6+ Mos 12/30/2014, 05/12/2022   PFIZER(Purple Top)SARS-COV-2 Vaccination 07/30/2019, 08/20/2019, 04/11/2020   PNEUMOCOCCAL CONJUGATE-20 09/22/2021   Pneumococcal Polysaccharide-23 12/30/2014   Td 07/22/2020   Shingles vaccine:  I recommend you have a shingles vaccine to help prevent shingles or herpes zoster outbreak.   Please call your insurer to inquire about coverage for the Shingrix vaccine given in 2 doses.   Some insurers cover this vaccine after age 24, some cover this after age 25.  If your insurer covers this, then call to schedule appointment to have this vaccine here.   Peripheral vascular disease, atherosclerosis-continue rosuvastatin Crestor 40 mg daily, Plavix 75 mg daily  BPH-continue Flomax  Continue routine follow-up with eye doctor and continue your eyedrops as usual  Venous stasis disease-continue routine walking for exercise.    I reviewed his July 2024 cardiology notes.  At that time they noted mild aortic stenosis, moderate to severe aortic insufficiency, stable, advised echocardiogram and repeat echocardiogram in a year as well, they continued him on rosuvastatin 40 mg daily, continue Plavix and advised smoking cessation.  Echocardiogram September 2024 showed EF 53%, grade 1 impaired diastolic dysfunction, moderate calcification of the aortic valve, moderate aortic stenosis, moderate to  severe aortic regurgitation, mild mitral regurgitation, IVC dilated with respiratory response to less than 50%, left atrial cavity moderately dilated and right atrial cavity mildly dilated  I reviewed his vascular surgery consult June 2024, and they continue Plavix and statin, known occluded left ICA and right ICA with 40 to 59% stenosis, they advise repeat carotid duplex in 1 year    Problem List Items Addressed This Visit     Mixed hyperlipidemia   Relevant Medications   rosuvastatin (CRESTOR) 40 MG tablet   Tobacco abuse   Relevant Orders   VAS [place] AAA DUPLEX   VAS [place] ABI WITH/WO TBI   Abnormal thyroid blood test   Relevant Orders   TSH + free T4   Vaccine counseling   Impaired fasting blood sugar   Relevant Orders   Hemoglobin A1c   PVD (peripheral vascular disease) (HCC)   Relevant Medications   rosuvastatin (CRESTOR) 40 MG tablet   Other Relevant Orders   VAS [place] AAA DUPLEX   VAS [place] ABI WITH/WO TBI   Occlusion of left carotid artery   Relevant Medications   rosuvastatin (CRESTOR) 40 MG tablet  Nondiabetic proliferative retinopathy, left   Central retinal vein occlusion, left eye, with retinal neovascularization   Relevant Medications   rosuvastatin (CRESTOR) 40 MG tablet   Varicose veins of both lower extremities   Relevant Medications   rosuvastatin (CRESTOR) 40 MG tablet   Other Visit Diagnoses       Dry skin    -  Primary     Blood-tinged sputum         Decreased pedal pulses       Relevant Orders   VAS [place] ABI WITH/WO TBI       Follow-up pending labs, yearly for physical

## 2023-06-06 ENCOUNTER — Other Ambulatory Visit: Payer: Self-pay | Admitting: Medical

## 2023-06-06 DIAGNOSIS — R7989 Other specified abnormal findings of blood chemistry: Secondary | ICD-10-CM

## 2023-06-06 LAB — TSH+FREE T4
Free T4: 1.41 ng/dL (ref 0.82–1.77)
TSH: 0.278 u[IU]/mL — ABNORMAL LOW (ref 0.450–4.500)

## 2023-06-06 LAB — HEMOGLOBIN A1C
Est. average glucose Bld gHb Est-mCnc: 123 mg/dL
Hgb A1c MFr Bld: 5.9 % — ABNORMAL HIGH (ref 4.8–5.6)

## 2023-06-06 NOTE — Progress Notes (Signed)
Diabetes marker stable.  Thyroid labs still abnormal.  I have placed a new referral to endocrinology.  I would like you to see an endocrinologist regarding this abnormal lab.  We have made this referral before and I think this somehow did not happen in the past either through no-show or some other issue, because I do not believe you have seen them yet.  Lets try again to get you into endocrinology for consult  Expect phone call about scheduling the imaging we discussed, ultrasound of abdomen and blood flow screen in the legs

## 2023-06-16 ENCOUNTER — Telehealth: Payer: Self-pay | Admitting: Cardiology

## 2023-06-16 NOTE — Telephone Encounter (Signed)
 Pt is sch to have AAA Duplex and ABI on 07/05/23 but he will be out of the [place]. I was going to r/s him to 08/08/23 but it goes pass the expiration date. Could you adjust expiration date so I can r/s his appts? Thank for your help

## 2023-07-05 ENCOUNTER — Inpatient Hospital Stay (HOSPITAL_COMMUNITY): Admission: RE | Admit: 2023-07-05 | Payer: BC Managed Care – PPO | Source: Ambulatory Visit

## 2023-07-05 ENCOUNTER — Other Ambulatory Visit (HOSPITAL_COMMUNITY): Payer: BC Managed Care – PPO

## 2023-08-08 ENCOUNTER — Ambulatory Visit (HOSPITAL_COMMUNITY): Admission: RE | Admit: 2023-08-08 | Payer: BC Managed Care – PPO | Source: Ambulatory Visit

## 2023-08-08 ENCOUNTER — Ambulatory Visit (HOSPITAL_COMMUNITY): Payer: BC Managed Care – PPO

## 2023-08-09 ENCOUNTER — Telehealth: Payer: Self-pay | Admitting: Medical

## 2023-08-09 NOTE — Telephone Encounter (Signed)
 Pt was notified and will try to contact them again.  2 times he called and was out of country Last time his car wouldn't start and he has tried to reach them again to reschedule but has been unable to get anyone.  Advised to call back for recheck

## 2023-08-09 NOTE — Telephone Encounter (Signed)
 Copied from CRM [id number]. Topic: General - Other >> Aug 09, 2023  2:53 PM Terry Gross wrote: Reason for CRM: Patient has had to reschedule 3 times for imaging at [facility] vascular. After 3 times they usually don't attempt again to schedule patient. He is having trouble with transportation and they wanted to let the provider know, and that he may benefit from speaking with a Child psychotherapist as he has trouble keeping many appointments.

## 2023-08-20 ENCOUNTER — Emergency Department (HOSPITAL_COMMUNITY): Admission: EM | Admit: 2023-08-20 | Discharge: 2023-08-20 | Disposition: A | Discharge status: Home / Self Care

## 2023-08-20 ENCOUNTER — Other Ambulatory Visit: Payer: Self-pay

## 2023-08-20 ENCOUNTER — Emergency Department (HOSPITAL_COMMUNITY)

## 2023-08-20 DIAGNOSIS — Z7902 Long term (current) use of antithrombotics/antiplatelets: Secondary | ICD-10-CM | POA: Diagnosis not present

## 2023-08-20 DIAGNOSIS — S46911A Strain of unspecified muscle, fascia and tendon at shoulder and upper arm level, right arm, initial encounter: Secondary | ICD-10-CM | POA: Insufficient documentation

## 2023-08-20 DIAGNOSIS — R042 Hemoptysis: Secondary | ICD-10-CM | POA: Insufficient documentation

## 2023-08-20 DIAGNOSIS — X58XXXA Exposure to other specified factors, initial encounter: Secondary | ICD-10-CM | POA: Insufficient documentation

## 2023-08-20 DIAGNOSIS — S4991XA Unspecified injury of right shoulder and upper arm, initial encounter: Secondary | ICD-10-CM | POA: Diagnosis present

## 2023-08-20 DIAGNOSIS — Z720 Tobacco use: Secondary | ICD-10-CM | POA: Diagnosis not present

## 2023-08-20 DIAGNOSIS — D649 Anemia, unspecified: Secondary | ICD-10-CM | POA: Diagnosis not present

## 2023-08-20 DIAGNOSIS — R059 Cough, unspecified: Secondary | ICD-10-CM | POA: Diagnosis not present

## 2023-08-20 LAB — CBC WITH DIFFERENTIAL/PLATELET
Abs Immature Granulocytes: 0.03 10*3/uL (ref 0.00–0.07)
Basophils Absolute: 0 10*3/uL (ref 0.0–0.1)
Basophils Relative: 0 %
Eosinophils Absolute: 0 10*3/uL (ref 0.0–0.5)
Eosinophils Relative: 1 %
HCT: 29.9 % — ABNORMAL LOW (ref 39.0–52.0)
Hemoglobin: 9.4 g/dL — ABNORMAL LOW (ref 13.0–17.0)
Immature Granulocytes: 0 %
Lymphocytes Relative: 32 %
Lymphs Abs: 2.6 10*3/uL (ref 0.7–4.0)
MCH: 26 pg (ref 26.0–34.0)
MCHC: 31.4 g/dL (ref 30.0–36.0)
MCV: 82.8 fL (ref 80.0–100.0)
Monocytes Absolute: 0.7 10*3/uL (ref 0.1–1.0)
Monocytes Relative: 8 %
Neutro Abs: 4.8 10*3/uL (ref 1.7–7.7)
Neutrophils Relative %: 59 %
Platelets: 236 10*3/uL (ref 150–400)
RBC: 3.61 MIL/uL — ABNORMAL LOW (ref 4.22–5.81)
RDW: 16.1 % — ABNORMAL HIGH (ref 11.5–15.5)
WBC: 8.2 10*3/uL (ref 4.0–10.5)
nRBC: 0 % (ref 0.0–0.2)

## 2023-08-20 LAB — BASIC METABOLIC PANEL WITH GFR
Anion gap: 7 (ref 5–15)
BUN: 14 mg/dL (ref 6–20)
CO2: 24 mmol/L (ref 22–32)
Calcium: 8.6 mg/dL — ABNORMAL LOW (ref 8.9–10.3)
Chloride: 108 mmol/L (ref 98–111)
Creatinine, Ser: 0.65 mg/dL (ref 0.61–1.24)
GFR, Estimated: >60 mL/min (ref >60)
Glucose, Bld: 98 mg/dL (ref 70–99)
Potassium: 4.2 mmol/L (ref 3.5–5.1)
Sodium: 139 mmol/L (ref 135–145)

## 2023-08-20 MED ORDER — NAPROXEN 500 MG PO TABS: 500.0000 mg | ORAL_TABLET | Freq: Two times a day (BID) | ORAL | 0 refills | Status: DC

## 2023-08-20 MED ORDER — METHOCARBAMOL 500 MG PO TABS: 500.0000 mg | ORAL_TABLET | Freq: Two times a day (BID) | ORAL | 0 refills | Status: DC

## 2023-08-20 MED ORDER — IOHEXOL 350 MG/ML SOLN
75.0000 mL | Freq: Once | INTRAVENOUS | Status: AC | PRN
  Administered 2023-08-20: 75 mL via INTRAVENOUS

## 2023-08-20 MED ORDER — BENZONATATE 100 MG PO CAPS: 100.0000 mg | ORAL_CAPSULE | Freq: Three times a day (TID) | ORAL | 0 refills | Status: AC

## 2023-08-20 NOTE — ED Triage Notes (Addendum)
 PT arrived POV from home c/o coughing up pink tinged sputum since the end of march, pt states it happened back in August and he was given antibiotics. Pt is also c/o right arm pain after doing yard work, but has full range of motion, no weakness.

## 2023-08-20 NOTE — Discharge Instructions (Addendum)
 Thank you for letting us  evaluate you today.  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  As discussed, this is an abnormal " tangling" of blood vessels.  These sometimes occur at birth or by environmental factors such as smoking.  It is recommended that you follow-up with pulmonology at some point to follow-up with this.  Nothing emergent.  No pneumonia nor fluid in your lungs.  No antibiotics required.  I have sent a prescription for anticoughing medicine to your pharmacy. Return to ED if you have shortness of breath, coughing up copious amounts of blood, chest pain  For your arm, I have sent naproxen and Robaxin to your pharmacy.  These are anti-inflammatories and muscle relaxers respectively. You may use naproxen and Tylenol intermittently every 8 hours as needed for pain.  Please do not use naproxen with aspirin, Aleve, ibuprofen, Advil as they are all in the same family. Robaxin may cause drowsiness so do not operate heavy machinery including driving or drink alcohol with this.  You may take this at night or split the tablet in half if it makes you too drowsy. Try to rest arm, put hot/ice on arm in areas of pain. If symptoms do not improve within the next 7-10 days, I provided you with an orthopedic follow-up to follow-up for further management of this for possible rotator cuff pathology in your shoulder.  Also, please follow-up with your primary care provider regarding low blood counts.  They are steadily decreasing and this could be due to iron deficiency anemia which is caused by her body not producing enough iron and is easily remedied with pills. Return to ED if you have blood in stool

## 2023-08-20 NOTE — ED Provider Notes (Signed)
 [facility] Provider Note   CSN: [id number] Arrival date & time: 08/20/23  1513     History  Chief Complaint  Patient presents with   Cough   Arm Pain    Terry Gross is a 58 y.o. male with past medical history of HLD, PVD, BPH, pulmonary emphysema, tobacco use presents to emergency department for evaluation of coughing with intermittent hemoptysis that started 10 days ago.  He endorses hemoptysis as "blood specks mixed with mucus".  He reports that he has had this in the past and was diagnosed with pneumonia.  He is currently on Plavix but not on any anticoagulation.  He recently returned from [place] on 3/20.  He smokes 6 cigarettes a day.  He denies nasal congestion, sick contacts, fevers, chills, chest pain, shortness of breath.  He was evaluated on 12/28/2022 and 01/11/2023 in ED for cough and mild hemoptysis.  On 8/21 he was diagnosed with pneumonia with negative PE study.  He also complains of right arm pain that started Wednesday.  He noticed this at work when he was lifting heavy objects.  He has been using IcyHot without relief.  No fever, known trauma, nor falls.   Cough Associated symptoms: no chest pain, no chills, no fever, no headaches, no shortness of breath and no wheezing   Arm Pain Pertinent negatives include no chest pain, no abdominal pain, no headaches and no shortness of breath.       Home Medications Prior to Admission medications   Medication Sig Start Date End Date Taking? Authorizing Provider  benzonatate (TESSALON) 100 MG capsule Take 1 capsule (100 mg total) by mouth every 8 (eight) hours for 20 days. 08/20/23 09/09/23 Yes Royann Cords, PA  methocarbamol (ROBAXIN) 500 MG tablet Take 1 tablet (500 mg total) by mouth 2 (two) times daily. 08/20/23  Yes Royann Cords, PA  naproxen (NAPROSYN) 500 MG tablet Take 1 tablet (500 mg total) by mouth 2 (two) times daily. 08/20/23  Yes Royann Cords, PA   dorzolamide-timolol (COSOPT) 2-0.5 % ophthalmic solution Place 1 drop into the left eye 2 (two) times daily. 04/08/22   [provider]  latanoprost (XALATAN) 0.005 % ophthalmic solution INSTILL 1 DROP IN LEFT EYE NIGHTLY 04/06/21   Rankin, Alleen Arbour, MD  Omega-3 Fatty Acids (FISH OIL) 1200 MG CAPS Take by mouth daily.    [provider]  rosuvastatin (CRESTOR) 40 MG tablet Take 1 tablet (40 mg total) by mouth daily. 06/05/23   Tysinger, Christiane Cowing, PA-C  tamsulosin (FLOMAX) 0.4 MG CAPS capsule Take 1 capsule (0.4 mg total) by mouth daily after supper. 06/05/23   Tysinger, Christiane Cowing, PA-C      Allergies    Patient has no known allergies.    Review of Systems   Review of Systems  Constitutional:  Negative for chills, fatigue and fever.  Respiratory:  Positive for cough. Negative for chest tightness, shortness of breath and wheezing.   Cardiovascular:  Negative for chest pain and palpitations.  Gastrointestinal:  Negative for abdominal pain, constipation, diarrhea, nausea and vomiting.  Neurological:  Negative for dizziness, seizures, weakness, light-headedness, numbness and headaches.    Physical Exam Updated Vital Signs BP 126/74   Pulse 71   Temp (!) 97.1 F (36.2 C) (Oral)   Resp 16   Ht 5\' 8"  (1.727 m)   Wt 64.9 kg   SpO2 99%   BMI 21.74 kg/m  Physical Exam Vitals and nursing  note reviewed.  Constitutional:      General: He is not in acute distress.    Appearance: Normal appearance. He is not ill-appearing.  HENT:     Head: Normocephalic and atraumatic.  Eyes:     Conjunctiva/sclera: Conjunctivae normal.  Cardiovascular:     Rate and Rhythm: Normal rate.     Pulses:          Radial pulses are 2+ on the right side and 2+ on the left side.       Dorsalis pedis pulses are 2+ on the right side and 2+ on the left side.     Heart sounds: Normal heart sounds. No murmur heard.    No friction rub.  Pulmonary:     Effort: Pulmonary effort is normal. No respiratory  distress.     Breath sounds: Normal breath sounds.     Comments: Speaking in full complete sentences without difficulty.  No coughing or hemoptysis noted during entirety of ED visit Chest:     Chest wall: No tenderness.  Musculoskeletal:       Arms:     Right lower leg: No edema.     Left lower leg: No edema.     Comments: TTP of right musculature of right pectoral muscle and right bicep muscle.  No bony tenderness nor overlying skin changes of right shoulder, right pectoral, nor right arm.  Full ROM of right shoulder.  Motor 5/5 of right shoulder flexion and extension.  Sensation 2/2 of RUE.  Grip strength equal and intact.  No swelling or tenderness of BLE.  Negative Homans' sign x 2  Skin:    Capillary Refill: Capillary refill takes less than 2 seconds.     Coloration: Skin is not jaundiced or pale.  Neurological:     Mental Status: He is alert and oriented to person, place, and time. Mental status is at baseline.     ED Results / Procedures / Treatments   Labs (all labs ordered are listed, but only abnormal results are displayed) Labs Reviewed  BASIC METABOLIC PANEL WITH GFR - Abnormal; Notable for the following components:      Result Value   Calcium 8.6 (*)    All other components within normal limits  CBC WITH DIFFERENTIAL/PLATELET - Abnormal; Notable for the following components:   RBC 3.61 (*)    Hemoglobin 9.4 (*)    HCT 29.9 (*)    RDW 16.1 (*)    All other components within normal limits    EKG EKG Interpretation Date/Time:  Sunday August 20 2023 16:28:36 EDT Ventricular Rate:  77 PR Interval:  176 QRS Duration:  70 QT Interval:  365 QTC Calculation: 413 R Axis:   46  Text Interpretation: Sinus rhythm Probable left atrial enlargement Abnormal R-wave progression, early transition Confirmed by Abner Hoffman [phone]) on 08/20/2023 4:30:43 PM  Radiology CT Angio Chest PE W and/or Wo Contrast Result Date: 08/20/2023 CLINICAL DATA:  Pulmonary embolism (PE)  suspected, high prob. Coughing up pink sputum, right arm pain EXAM: CT ANGIOGRAPHY CHEST WITH CONTRAST TECHNIQUE: Multidetector CT imaging of the chest was performed using the standard protocol during bolus administration of intravenous contrast. Multiplanar CT image reconstructions and MIPs were obtained to evaluate the vascular anatomy. RADIATION DOSE REDUCTION: This exam was performed according to the departmental dose-optimization program which includes automated exposure control, adjustment of the mA and/or kV according to patient size and/or use of iterative reconstruction technique. CONTRAST:  75mL OMNIPAQUE IOHEXOL 350 MG/ML SOLN  COMPARISON:  12/28/2022 FINDINGS: Cardiovascular: Heart size upper limits normal. No pericardial effusion. The RV is nondilated. Satisfactory opacification of pulmonary arteries noted, and there is no evidence of pulmonary emboli. 7 mm enhancing subpleural focus in the superior segment left lower lobe (Im59,Se6) may represent a small AVM, stable since previous. Mild scattered coronary calcifications. Coarse aortic leaflet calcifications. Adequate contrast opacification of the thoracic aorta with no evidence of dissection, aneurysm, or stenosis. There is classic 3-vessel brachiocephalic arch anatomy without proximal stenosis. Partially calcified atheromatous plaque in the ascending thoracic aorta, arch, and visualized proximal abdominal aorta. Mediastinum/Nodes: No mediastinal hematoma, mass, or adenopathy. Lungs/Pleura: No pleural effusion. No pneumothorax. Pulmonary emphysema. Multiple subpleural blebs peripherally in both upper lobes, and posteriorly in the lower lobes, with some decrease in airspace opacities posteriorly in the left lower lung since previous exam. Upper Abdomen: No acute findings. Musculoskeletal: No chest wall abnormality. No acute or significant osseous findings. Review of the MIP images confirms the above findings. IMPRESSION: 1. Negative for acute PE or  thoracic aortic dissection. 2. 7 mm enhancing subpleural focus in the superior segment left lower lobe, possibly a small AVM. Consider elective pulmonary consultation. 3. Emphysema Electronically Signed   By: Nicoletta Barrier M.D.   On: 08/20/2023 19:21   DG Chest 2 View Result Date: 08/20/2023 CLINICAL DATA:  hemoptysis EXAM: CHEST - 2 VIEW COMPARISON:  January 11, 2023, December 28, 2022 FINDINGS: Hyperexpanded lungs with flattening of the diaphragms, likely reflecting changes from underlying emphysema. No focal airspace consolidation, pleural effusion, or pneumothorax. Aortic atherosclerosis. No cardiomegaly. Multilevel degenerative disc disease of the spine. IMPRESSION: Emphysema.  Otherwise, no acute cardiopulmonary abnormality. Electronically Signed   By: Rance Burrows M.D.   On: 08/20/2023 17:26    Procedures Procedures    Medications Ordered in ED Medications  iohexol (OMNIPAQUE) 350 MG/ML injection 75 mL (75 mLs Intravenous Contrast Given 08/20/23 1837)    ED Course/ Medical Decision Making/ A&P                                 Medical Decision Making Amount and/or Complexity of Data Reviewed Labs: ordered. Radiology: ordered.  Risk Prescription drug management.   Patient presents to the ED for concern of hemoptysis, right arm pain, this involves an extensive number of treatment options, and is a complaint that carries with it a high risk of complications and morbidity.    The differential diagnosis includes PE, tonsillitis, foreign body, pneumonia, mucosal irritation, esophageal varices  The differential diagnosis includes fracture, dislocation, contusion, MSK strain, rotator cuff pathology   Co morbidities that complicate the patient evaluation  See HPI   Additional history obtained:  Additional history obtained from Nursing   External records from outside source obtained and reviewed including triage RN note   Lab Tests:  I Ordered, and personally interpreted  labs.  The pertinent results include:   Hemoglobin 9.4 (baseline has been 12.1-12.8 over past 9 months.  Recently it was 12.2 7 months ago)   Imaging Studies ordered:  I ordered imaging studies including chest x-ray and CT PE I independently visualized and interpreted imaging which showed emphysema and 7 mm enhancing subpleural focus in the superior segment left lower lobe possibly small AVM I agree with the radiologist interpretation   Cardiac Monitoring:  The patient was maintained on a cardiac monitor.  I personally viewed and interpreted the cardiac monitored which showed an underlying rhythm of:  NSR with no ST nor ischemia.  Similar to prior.   Medicines ordered and prescription drug management:  I ordered medication including naproxen, Robaxin, Tessalon Perles for muscle strain, cough respectively Reevaluation of the patient after these medicines showed that the patient improved I have reviewed the patients home medicines and have made adjustments as needed    Problem List / ED Course:  Hemoptysis No gross or copious hemoptysis described. No anemia. No hypoxia nor resp distress No pneumonia nor PE.  Possible AVM found on CT PE study may be contributing to hemoptysis. Will provide pulm outpatient f/u He also complains of a cough which could be secondary to COPD/emphysema/tobacco use and could be worsening hemoptysis.  Will provide Tessalon Perles to reduce cough R arm pain Seems related to muscle strain. No bony tenderness nor known traumatic injury. Noninfectious appearing. Well perfused ext. Full ROM. Will provide naprosyn and robaxin for symptoms. Also provided ortho f/u in case symptoms do not improve within next 7-10 days with meds and symptomatic care Anemia Baseline has been 12.1-12.8 over the past 9 months with most recent being 12.2 seven months ago.  Do not think this is related to hemoptysis as he describes "small specks" I offered to do rectal exam and obtain fecal  occult however he declines at this time.  He denies hematochezia, melena.  Last had Cologuard 1 year ago that was normal.  Denies headaches, dizziness, lightheadedness.   No tachycardia nor hypotension Will have patient follow-up with PCP for anemia panel to rule out IDA or other etiology of anemia   Reevaluation:  After the interventions noted above, I reevaluated the patient and found that they have :improved    Dispostion:  After consideration of the diagnostic results and the patients response to treatment, I feel that the patent would benefit from outpatient management with pulmonology follow-up.   Discussed ED workup, disposition, return to ED precautions with patient who expresses understanding agrees with plan.  All questions answered to their satisfaction.  They are agreeable to plan.  Discharge instructions provided on paperwork Final Clinical Impression(s) / ED Diagnoses Final diagnoses:  Cough with hemoptysis  Muscle strain of right upper arm, initial encounter  Anemia, unspecified type    Rx / DC Orders ED Discharge Orders          Ordered    benzonatate (TESSALON) 100 MG capsule  Every 8 hours        08/20/23 1953    naproxen (NAPROSYN) 500 MG tablet  2 times daily        08/20/23 1954    methocarbamol (ROBAXIN) 500 MG tablet  2 times daily        08/20/23 1954              Royann Cords, PA 08/20/23 2244    Carin Charleston, MD 08/20/23 2308

## 2023-08-22 ENCOUNTER — Telehealth: Payer: Self-pay | Admitting: Medical

## 2023-08-22 NOTE — Telephone Encounter (Signed)
 Patient came by PCP office today and states that he has been unable to reach anyone in regards to the 2 U/S to reschedule and noone has called him back. I did verify correct number for him. Can you see if we can get patient rescheduled for the AAA Duplex and VAS ABIs.

## 2023-08-22 NOTE — Telephone Encounter (Signed)
 Pt is aware Terry Gross is out of the office this week.  He went to the ED on 08/20/2023 and they stated his blood count was low. He wants Terry Gross to look over his ED Visit to see what he thinks. He did not want to schedule a follow up until Terry Gross looked over his visit.Terry Gross

## 2023-08-26 ENCOUNTER — Other Ambulatory Visit: Payer: Self-pay | Admitting: Medical

## 2023-08-28 NOTE — Telephone Encounter (Signed)
 Already refilled

## 2023-08-30 ENCOUNTER — Ambulatory Visit (INDEPENDENT_AMBULATORY_CARE_PROVIDER_SITE_OTHER): Admitting: Pulmonary Disease

## 2023-08-30 ENCOUNTER — Encounter: Payer: Self-pay | Admitting: Pulmonary Disease

## 2023-08-30 VITALS — BP 130/70 | HR 76 | Temp 98.4°F | Ht 68.0 in | Wt 144.0 lb

## 2023-08-30 DIAGNOSIS — I6522 Occlusion and stenosis of left carotid artery: Secondary | ICD-10-CM | POA: Diagnosis not present

## 2023-08-30 DIAGNOSIS — F17210 Nicotine dependence, cigarettes, uncomplicated: Secondary | ICD-10-CM | POA: Diagnosis not present

## 2023-08-30 DIAGNOSIS — J432 Centrilobular emphysema: Secondary | ICD-10-CM

## 2023-08-30 DIAGNOSIS — R0609 Other forms of dyspnea: Secondary | ICD-10-CM

## 2023-08-30 NOTE — Patient Instructions (Signed)
 VISIT SUMMARY:  During your visit, we discussed your recent health concerns, including your history of emphysema, a possible arteriovenous malformation (AVM) in your lung, and your past pneumonia. We reviewed your current symptoms and medical history, and we have outlined a plan to address each of these issues.  YOUR PLAN:  -EMPHYSEMA: Emphysema is a chronic lung condition that causes shortness of breath due to damage to the air sacs in the lungs, often caused by smoking. You have reduced your smoking but continue to smoke a few cigarettes daily. It is strongly advised that you quit smoking entirely to prevent further lung damage. We will support you in your efforts to quit smoking and may explore alternative methods to help you. Additionally, we will order a lung function test to assess your current lung health.  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 Although it is not causing symptoms right now, we will order a repeat chest CT scan to evaluate the AVM and check for any other lung issues.  -PNEUMONIA: Pneumonia is an infection that inflames the air sacs in one or both lungs. Your previous pneumonia has been treated, and you have no current symptoms. However, we will order a repeat chest CT scan to check for any residual scarring or other lung conditions.  -AORTIC STENOSIS: Aortic stenosis is a narrowing of the heart's aortic valve, which can restrict blood flow. You are already under the care of a cardiologist and have regular follow-ups and echocardiograms to monitor this condition.  -CAROTID ARTERY STENOSIS: Carotid artery stenosis is a narrowing of the carotid arteries, which can reduce blood flow to the brain. You are managing this condition with Plavix  and regular follow-ups with your vascular specialist. You also maintain good cholesterol levels and regular exercise, which is  excellent for your overall health.  INSTRUCTIONS:  Please schedule a lung function test and a repeat chest CT scan as soon as possible. Continue your regular follow-ups with your cardiologist and vascular specialist. If you experience any new symptoms or have concerns, contact our office immediately.

## 2023-08-30 NOTE — Progress Notes (Signed)
 Terry Gross    [id number]    12-Mar-1966  Primary Care Physician:Tysinger, Christiane Cowing, PA-C  Referring Physician: Claudene Crystal, PA-C [address] [place],  [place] [address]  Chief complaint: Consult for emphysema, hemoptysis  HPI: 58 y.o. who  has a past medical history of Aortic atherosclerosis (HCC), Aortic valve insufficiency (12/15/2014), Back pain, Carotid artery occlusion, Cataract, Hyperlipidemia, Hypertension, Impaired fasting blood sugar, Left epiretinal membrane (09/12/2019), Loss of balance, Peripheral vascular disease (HCC), Smoker, and Vitreous hemorrhage of left eye (HCC) (06/10/2019).  Discussed the use of AI scribe software for clinical note transcription with the patient, who gave verbal consent to proceed.  History of Present Illness Terry Gross is a 58 year old male with emphysema who presents with hemoptysis. He was referred by an emergency room physician for follow-up of a possible AVM in the lung.  In July 2024, he experienced a mild cough with sputum containing a small amount of blood, prompting an emergency room visit. A CT scan revealed a bacterial infection in the lower left lung, diagnosed as pneumonia, and he was treated with two antibiotics for seven days. After completing the treatment, he noticed a small amount of blood in his sputum again, leading to a follow-up visit where an x-ray showed resolution of the pneumonia. He was given cough medication and reported no further symptoms until recently.  On August 20, 2023, he experienced another episode of coughing with a small amount of blood in the sputum, leading to another emergency room visit. A CT scan and blood tests were performed, and he was informed of a possible arterial venous malformation (AVM) in the lung, but no immediate treatment was provided. He has not experienced further hemoptysis since that episode.  He has a history of emphysema, which was noted on previous CT scans.  He has been attempting to quit smoking, especially after being diagnosed with a blocked left carotid artery in 2020, for which he takes Plavix  and undergoes annual ultrasounds.  No current chest pain or further episodes of hemoptysis. He mentioned a history of a blocked carotid artery and is under the care of a vascular doctor.    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  No feather pillows or comforters Smoking history: Smoker since 1985.  Used to smoke a pack per day.  Now smokes 4 to 5 cigarettes/day Travel history: Originally from [place].  Has been in [place]  since 1991 Relevant family history: No family history of lung disease   Outpatient Encounter Medications as of 08/30/2023  Medication Sig   benzonatate  (TESSALON ) 100 MG capsule Take 1 capsule (100 mg total) by mouth every 8 (eight) hours for 20 days.   brimonidine  (ALPHAGAN ) 0.2 % ophthalmic solution Place 1 drop into both eyes 3 (three) times daily.   clopidogrel  (PLAVIX ) 75 MG tablet Take 75 mg by mouth daily.   dorzolamide-timolol  (COSOPT) 2-0.5 % ophthalmic solution Place 1 drop into the left eye 2 (two) times daily.   latanoprost (XALATAN) 0.005 % ophthalmic solution INSTILL 1 DROP IN LEFT EYE NIGHTLY   methocarbamol  (ROBAXIN ) 500 MG tablet Take 1 tablet (500 mg total) by mouth 2 (two) times daily.   naproxen  (NAPROSYN ) 500 MG tablet Take 1 tablet (500 mg total) by mouth 2 (two) times daily.   Omega-3 Fatty Acids (FISH OIL) 1200 MG CAPS Take by mouth daily.   rosuvastatin  (CRESTOR ) 40 MG tablet Take 1 tablet (40 mg  total) by mouth daily.   tamsulosin  (FLOMAX ) 0.4 MG CAPS capsule Take 1 capsule (0.4 mg total) by mouth daily after supper.   No facility-administered encounter medications on file as of 08/30/2023.    Allergies as of 08/30/2023   (No Known Allergies)    Past Medical History:  Diagnosis Date   Aortic atherosclerosis (HCC)    Aortic valve insufficiency 12/15/2014    Moderate by ECHO 12/15/14 normal LV function    Back pain    Carotid artery occlusion    Cataract    Hyperlipidemia    Hypertension    Impaired fasting blood sugar    Left epiretinal membrane 09/12/2019   This condition is resolved after vitrectomy and membrane peel was performed, 02/06/2019.  Topographic distortion completely released.   Loss of balance    Peripheral vascular disease (HCC)    Smoker    Vitreous hemorrhage of left eye (HCC) 06/10/2019    Past Surgical History:  Procedure Laterality Date   CATARACT EXTRACTION     COLONOSCOPY  07/2020   never    Family History  Problem Relation Age of Onset   CVA Mother    Other Mother        brain tumor   Other Father        complications from prostate sugery   Cancer Father        prostate   Thyroid  disease Neg Hx    Heart disease Neg Hx    Diabetes Neg Hx    Kidney disease Neg Hx    Hypertension Neg Hx    Hyperlipidemia Neg Hx     Social History   Socioeconomic History   Marital status: Married    Spouse name: Not on file   Number of children: 0   Years of education: Not on file   Highest education level: Not on file  Occupational History   Not on file  Tobacco Use   Smoking status: Every Day    Current packs/day: 0.25    Average packs/day: 0.3 packs/day for 32.0 years (8.0 ttl pk-yrs)    Types: Cigarettes    Passive exposure: Never   Smokeless tobacco: Never  Vaping Use   Vaping status: Never Used  Substance and Sexual Activity   Alcohol use: No   Drug use: No   Sexual activity: Not on file  Other Topics Concern   Not on file  Social History Narrative   Married, no children.  Exercise some with calisthenics, walking.    From [place] originally.   11/2022   Social Drivers of Corporate investment banker Strain: Not on file  Food Insecurity: Not on file  Transportation Needs: Not on file  Physical Activity: Not on file  Stress: Not on file  Social Connections: Not on file  Intimate Partner  Violence: Not on file    Review of systems: Review of Systems  Constitutional: Negative for fever and chills.  HENT: Negative.   Eyes: Negative for blurred vision.  Respiratory: as per HPI  Cardiovascular: Negative for chest pain and palpitations.  Gastrointestinal: Negative for vomiting, diarrhea, blood per rectum. Genitourinary: Negative for dysuria, urgency, frequency and hematuria.  Musculoskeletal: Negative for myalgias, back pain and joint pain.  Skin: Negative for itching and rash.  Neurological: Negative for dizziness, tremors, focal weakness, seizures and loss of consciousness.  Endo/Heme/Allergies: Negative for environmental allergies.  Psychiatric/Behavioral: Negative for depression, suicidal ideas and hallucinations.  All other systems reviewed and are negative.  Physical  Exam: Blood pressure 130/70, pulse 76, temperature 98.4 F (36.9 C), temperature source Oral, height 5\' 8"  (1.727 m), weight 144 lb (65.3 kg), SpO2 100%. Gen:      No acute distress HEENT:  EOMI, sclera anicteric Neck:     No masses; no thyromegaly Lungs:    Clear to auscultation bilaterally; normal respiratory effort CV:         Regular rate and rhythm; systolic murmur noted Abd:      + bowel sounds; soft, non-tender; no palpable masses, no distension Ext:    No edema; adequate peripheral perfusion Skin:      Warm and dry; no rash Neuro: alert and oriented x 3 Psych: normal mood and affect  Data Reviewed: Imaging:  CTA 12/28/2022-no evidence of pulmonary embolism, left lower lobe pneumonia, bronchial wall thickening, mild to moderate paraseptal and centrilobular emphysema  CTA 08/20/2023-negative for PE, 7 mm subpleural focus in the superior segment of left lower lobe.  Possible small AVM, emphysema. I had reviewed images personally  PFTs: 07/22/2020 FVC 3.06 [72%], FEV1-0.21 [64%], F/F72 Mild airway obstruction  Labs: CBC 08/20/2023-WBC 8.2, eos 0% Assessment & Plan Emphysema Chronic  emphysema likely due to long-term smoking. He has reduced smoking but continues a few cigarettes daily. Smoking cessation is strongly advised as emphysema is causing lung damage. Previous attempts with medications like Chantix were ineffective. - Encourage smoking cessation.  Time spent counseling-5 minutes.  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 Not currently causing symptoms such as hemoptysis. Considered not significant at this time. - Order repeat chest CT to evaluate AVM and assess for interstitial lung disease  Pneumonia Previous left lobe pneumonia treated with antibiotics. No current symptoms. Possible residual scarring or interstitial lung disease noted, but not confirmed. - Order repeat chest CT to assess for scarring or interstitial lung disease  Aortic stenosis Aortic stenosis with known heart murmur. He is under Dr. Filiberto Hug, cardiology care with regular follow-up and echocardiograms.  Carotid artery stenosis Left carotid artery stenosis, managed with Plavix  and regular follow-up with a vascular specialist. He reports good cholesterol levels and regular exercise.   Recommendations: PFTs Smoking cessation High-res CT of chest  Phyllis Breeze MD [facility] 08/30/2023, 3:18 PM  CC: Claudene Crystal, PA-C

## 2023-09-04 ENCOUNTER — Ambulatory Visit (INDEPENDENT_AMBULATORY_CARE_PROVIDER_SITE_OTHER): Admitting: Medical

## 2023-09-04 VITALS — BP 130/80 | HR 67 | Wt 141.2 lb

## 2023-09-04 DIAGNOSIS — D649 Anemia, unspecified: Secondary | ICD-10-CM | POA: Diagnosis not present

## 2023-09-04 DIAGNOSIS — R9389 Abnormal findings on diagnostic imaging of other specified body structures: Secondary | ICD-10-CM | POA: Diagnosis not present

## 2023-09-04 DIAGNOSIS — I739 Peripheral vascular disease, unspecified: Secondary | ICD-10-CM | POA: Diagnosis not present

## 2023-09-04 DIAGNOSIS — R7301 Impaired fasting glucose: Secondary | ICD-10-CM

## 2023-09-04 DIAGNOSIS — R042 Hemoptysis: Secondary | ICD-10-CM | POA: Diagnosis not present

## 2023-09-04 DIAGNOSIS — I700 Atherosclerosis of aorta: Secondary | ICD-10-CM

## 2023-09-04 DIAGNOSIS — Z720 Tobacco use: Secondary | ICD-10-CM

## 2023-09-04 LAB — POCT GLYCOSYLATED HEMOGLOBIN (HGB A1C): Hemoglobin A1C: 6 % — AB (ref 4.0–5.6)

## 2023-09-04 NOTE — Progress Notes (Signed)
 Subjective:  Terry Gross is a 58 y.o. male who presents for Chief Complaint  Patient presents with   Acute Visit    Hospital follow up. A1c check.      Here for hospital f/u.   He was seen on August 20, 2023 in emergency department.  He was seen for just a small amount of blood with coughing or with mixed mucus.  He had a CT scan that was done and they found a possible AV malformation.  He was referred back to pulmonology.  He also was found to have a hemoglobin that dropped down to the 9 level.  He denies weakness, fatigue, bleeding, no more hemoptysis, no other symptoms today.  No bruising.  He did see the pulmonologist recently.  Was not really keen on doing a repeat or second CT scan since he just had one done.  He does not drink alcohol.  He eats mostly vegetables and fish.  He was also supposed to have recent ultrasounds through vascular but had to reschedule due to being out of the country and the day he was supposed to go for the appointment in his car would not start.  He notes he called the vascular center to reschedule and thought they were going to call him back  No other aggravating or relieving factors.    No other c/o.  Past Medical History:  Diagnosis Date   Aortic atherosclerosis (HCC)    Aortic valve insufficiency 12/15/2014   Moderate by ECHO 12/15/14 normal LV function    Back pain    Carotid artery occlusion    Cataract    Hyperlipidemia    Hypertension    Impaired fasting blood sugar    Left epiretinal membrane 09/12/2019   This condition is resolved after vitrectomy and membrane peel was performed, 02/06/2019.  Topographic distortion completely released.   Loss of balance    Peripheral vascular disease (HCC)    Smoker    Vitreous hemorrhage of left eye (HCC) 06/10/2019    Current Outpatient Medications on File Prior to Visit  Medication Sig Dispense Refill   benzonatate  (TESSALON ) 100 MG capsule Take 1 capsule (100 mg total) by mouth every 8  (eight) hours for 20 days. 60 capsule 0   brimonidine  (ALPHAGAN ) 0.2 % ophthalmic solution Place 1 drop into both eyes 3 (three) times daily.     clopidogrel  (PLAVIX ) 75 MG tablet Take 75 mg by mouth daily.     dorzolamide-timolol  (COSOPT) 2-0.5 % ophthalmic solution Place 1 drop into the left eye 2 (two) times daily.     latanoprost (XALATAN) 0.005 % ophthalmic solution INSTILL 1 DROP IN LEFT EYE NIGHTLY 2.5 mL 3   Omega-3 Fatty Acids (FISH OIL) 1200 MG CAPS Take by mouth daily.     rosuvastatin  (CRESTOR ) 40 MG tablet Take 1 tablet (40 mg total) by mouth daily. 90 tablet 3   tamsulosin  (FLOMAX ) 0.4 MG CAPS capsule Take 1 capsule (0.4 mg total) by mouth daily after supper. 90 capsule 3   methocarbamol  (ROBAXIN ) 500 MG tablet Take 1 tablet (500 mg total) by mouth 2 (two) times daily. (Patient not taking: Reported on 09/04/2023) 20 tablet 0   naproxen  (NAPROSYN ) 500 MG tablet Take 1 tablet (500 mg total) by mouth 2 (two) times daily. (Patient not taking: Reported on 09/04/2023) 30 tablet 0   No current facility-administered medications on file prior to visit.     The following portions of the patient's history were reviewed and updated as appropriate:  allergies, current medications, past family history, past medical history, past social history, past surgical history and problem list.  ROS Otherwise as in subjective above  Objective: BP 130/80   Pulse 67   Wt 141 lb 3.2 oz (64 kg)   BMI 21.47 kg/m   Wt Readings from Last 3 Encounters:  09/04/23 141 lb 3.2 oz (64 kg)  08/30/23 144 lb (65.3 kg)  08/20/23 143 lb (64.9 kg)   BP Readings from Last 3 Encounters:  09/04/23 130/80  08/30/23 130/70  08/20/23 126/74    General appearance: alert, no distress, well developed, well nourished HEENT: normocephalic, sclerae anicteric, conjunctiva pink and moist, TMs pearly, nares patent, no discharge or erythema, pharynx normal Oral cavity: MMM, no lesions Neck: supple, no lymphadenopathy, no  thyromegaly, no masses Heart: 2 out of 3 systolic murmur heard in the upper sternal border, unchanged, otherwise RRR, normal S1, S2 Lungs: CTA bilaterally, no wheezes, rhonchi, or rales Abdomen: +bs, soft, non tender, non distended, no masses, no hepatomegaly, no splenomegaly Pulses: 2+ radial pulses, 2+ pedal pulses, normal cap refill Ext: no edema   Assessment: Encounter Diagnoses  Name Primary?   Anemia, unspecified type Yes   Hemoptysis    Abnormal CT of the chest    Aortic atherosclerosis (HCC)    Impaired fasting blood sugar    Tobacco abuse    PVD (peripheral vascular disease) (HCC)      Plan: Anemia-his recent labs show a drop in hemoglobin from 12.2 in September 2024 to 9.4 hgb on 08/20/22.  He denies any specific bleeding.  He also had a recent emergency department for hemoptysis.  He does note he was only very small amount barely noticeable.  Updated labs today.  Advised referral for updated colon cancer screening as he is due.  He wants to see what the other labs show first before referral.  Abnormal CT of chest, possibly due to malformation-I reviewed the recent pulmonology notes.  I did explain to him that the other CT ordered is due around to further evaluate the concerns  Impaired glucose-hemoglobin A1c at risk for diabetes but still less than 7  We will call vascular to try and get him back on the schedule for abdominal aortic aneurysm screening and ABI screening   Terry Gross was seen today for acute visit.  Diagnoses and all orders for this visit:  Anemia, unspecified type -     Iron, TIBC and Ferritin Panel -     Vitamin B12 -     Folate  Hemoptysis  Abnormal CT of the chest  Aortic atherosclerosis (HCC)  Impaired fasting blood sugar -     HgB A1c  Tobacco abuse  PVD (peripheral vascular disease) (HCC)    Follow up: Pending labs

## 2023-09-05 ENCOUNTER — Other Ambulatory Visit: Payer: Self-pay | Admitting: Medical

## 2023-09-05 DIAGNOSIS — D509 Iron deficiency anemia, unspecified: Secondary | ICD-10-CM

## 2023-09-05 DIAGNOSIS — Z1211 Encounter for screening for malignant neoplasm of colon: Secondary | ICD-10-CM

## 2023-09-05 LAB — IRON,TIBC AND FERRITIN PANEL
Ferritin: 13 ng/mL — ABNORMAL LOW (ref 30–400)
Iron Saturation: 6 % — CL (ref 15–55)
Iron: 23 ug/dL — ABNORMAL LOW (ref 38–169)
Total Iron Binding Capacity: 395 ug/dL (ref 250–450)
UIBC: 372 ug/dL — ABNORMAL HIGH (ref 111–343)

## 2023-09-05 LAB — FOLATE: Folate: 13.5 ng/mL (ref >3.0)

## 2023-09-05 LAB — VITAMIN B12: Vitamin B-12: 455 pg/mL (ref 232–1245)

## 2023-09-05 MED ORDER — FERROUS GLUCONATE 324 (38 FE) MG PO TABS: 324.0000 mg | ORAL_TABLET | Freq: Two times a day (BID) | ORAL | 1 refills | Status: DC

## 2023-09-05 NOTE — Progress Notes (Signed)
 Folate was normal, B12 normal, but iron is low  I recommend doing an iron infusion at the infusion center and starting oral iron once daily.  I will send the iron to the pharmacy and expect a phone call about scheduling the iron infusion  I recommend going ahead and referring you to gastroenterology for evaluation of iron deficiency and screening for colon cancer  I would follow-up with the lung doctor about the CT scan.  I did send them a message yesterday but has not heard back yet from them  Lets plan to recheck in 1 months as we will need to repeat some blood work as well

## 2023-09-06 ENCOUNTER — Telehealth: Payer: Self-pay

## 2023-09-06 ENCOUNTER — Other Ambulatory Visit: Payer: Self-pay | Admitting: Internal Medicine

## 2023-09-06 DIAGNOSIS — D509 Iron deficiency anemia, unspecified: Secondary | ICD-10-CM | POA: Insufficient documentation

## 2023-09-06 NOTE — Telephone Encounter (Signed)
 Hello,  Patient will be scheduled as soon as possible.   Auth Submission: NO AUTH NEEDED Site of care: Site of care: [facility] Payer: [place] [facility] Medication & CPT/J Code(s) submitted: Feraheme (ferumoxytol) [phone] Route of submission (phone, fax, portal): phone/portal Phone # Fax # Auth type: Buy/Bill HB Units/visits requested: 510mg , 1 dose Reference number: [id number] Approval from: 09/06/23 to 12/07/23

## 2023-09-12 ENCOUNTER — Telehealth: Payer: Self-pay

## 2023-09-12 NOTE — Addendum Note (Signed)
 Addended by: Katie Parks A on: 09/12/2023 02:45 PM   Modules accepted: Orders

## 2023-09-12 NOTE — Telephone Encounter (Signed)
 Spoke to patient and relayed below message/recommendations.   PCC's, please reschedule CT to Aug.  Thank you!

## 2023-09-12 NOTE — Telephone Encounter (Signed)
-----   Message from [facility] sent at 09/11/2023  1:14 PM EDT ----- I think it should be okay to reschedule the high-res CT to August of this year. Marjorie-can you please reorder and let the patient know.  Thank you ----- Message ----- From: Garner Jury Sent: 09/04/2023  11:24 AM EDT To: Phyllis Breeze, MD  Hello Dr. Waylan Haggard  I saw our mutual patient today.  He is wanting to defer the CT high-resolution at least 3 months or more.  He is concerned about radiation getting back to back CT scans.  You may want to reach out to him. He asked me the past that info on [employer], PA-C

## 2023-09-12 NOTE — Telephone Encounter (Signed)
 Order has been updated.

## 2023-09-13 NOTE — Telephone Encounter (Signed)
 Noted. Thanks.

## 2023-09-14 ENCOUNTER — Ambulatory Visit (INDEPENDENT_AMBULATORY_CARE_PROVIDER_SITE_OTHER)

## 2023-09-14 VITALS — BP 112/53 | HR 66 | Temp 97.5°F | Resp 20 | Ht 68.0 in | Wt 141.0 lb

## 2023-09-14 DIAGNOSIS — D509 Iron deficiency anemia, unspecified: Secondary | ICD-10-CM

## 2023-09-14 MED ORDER — FERUMOXYTOL INJECTION 510 MG/17 ML
510.0000 mg | Freq: Once | INTRAVENOUS | Status: AC
  Administered 2023-09-14: 510 mg via INTRAVENOUS
  Filled 2023-09-14: qty 17

## 2023-09-14 NOTE — Patient Instructions (Signed)

## 2023-09-14 NOTE — Progress Notes (Signed)
 Diagnosis: Iron Deficiency Anemia  Provider:  Praveen Mannam MD  Procedure: IV Infusion  IV Type: Peripheral, IV Location: R Antecubital  Feraheme (Ferumoxytol), Dose: 510 mg  Infusion Start Time: 0924  Infusion Stop Time: 0942  Post Infusion IV Care: Observation period completed and Peripheral IV Discontinued  Discharge: Condition: Good, Destination: Home . AVS Provided  Performed by:  Shaquita Fort, RN

## 2023-09-15 ENCOUNTER — Other Ambulatory Visit

## 2023-09-16 ENCOUNTER — Emergency Department (HOSPITAL_COMMUNITY)
Admission: EM | Admit: 2023-09-16 | Discharge: 2023-09-17 | Disposition: A | Discharge status: Home / Self Care | Attending: Emergency Medicine | Admitting: Emergency Medicine

## 2023-09-16 DIAGNOSIS — I10 Essential (primary) hypertension: Secondary | ICD-10-CM | POA: Insufficient documentation

## 2023-09-16 DIAGNOSIS — J189 Pneumonia, unspecified organism: Secondary | ICD-10-CM | POA: Diagnosis not present

## 2023-09-16 DIAGNOSIS — F17200 Nicotine dependence, unspecified, uncomplicated: Secondary | ICD-10-CM | POA: Diagnosis not present

## 2023-09-16 DIAGNOSIS — Z79899 Other long term (current) drug therapy: Secondary | ICD-10-CM | POA: Insufficient documentation

## 2023-09-16 DIAGNOSIS — R059 Cough, unspecified: Secondary | ICD-10-CM | POA: Diagnosis not present

## 2023-09-16 DIAGNOSIS — R042 Hemoptysis: Secondary | ICD-10-CM | POA: Insufficient documentation

## 2023-09-16 DIAGNOSIS — Z7902 Long term (current) use of antithrombotics/antiplatelets: Secondary | ICD-10-CM | POA: Insufficient documentation

## 2023-09-16 NOTE — ED Triage Notes (Signed)
 Pt came in via EMS from home w/ c/o of coughing up bright red clots. Pt stated it started about 30-60 mins ago. Hx of this happening. Stated it was pneumonia in past but last few visits to hospital showed nothing. Pt denies SHOB. Sharp pain in middles of chest when coughing. Pt takes Plavix .

## 2023-09-16 NOTE — ED Triage Notes (Incomplete)
 Pt came in via EMS from home w/ c/o of coughing up bright red clots. Pt stated it started about 30-60 mins ago. Hx of this happening. Stated it was pneumonia in past but last few visits to hospital showed nothing. Pt denies SHOB, or chest pain.

## 2023-09-17 ENCOUNTER — Emergency Department (HOSPITAL_COMMUNITY)

## 2023-09-17 ENCOUNTER — Other Ambulatory Visit: Payer: Self-pay

## 2023-09-17 ENCOUNTER — Encounter (HOSPITAL_COMMUNITY): Payer: Self-pay

## 2023-09-17 DIAGNOSIS — R042 Hemoptysis: Secondary | ICD-10-CM | POA: Diagnosis not present

## 2023-09-17 LAB — CBC WITH DIFFERENTIAL/PLATELET
Abs Immature Granulocytes: 0.03 10*3/uL (ref 0.00–0.07)
Basophils Absolute: 0 10*3/uL (ref 0.0–0.1)
Basophils Relative: 0 %
Eosinophils Absolute: 0.1 10*3/uL (ref 0.0–0.5)
Eosinophils Relative: 1 %
HCT: 34 % — ABNORMAL LOW (ref 39.0–52.0)
Hemoglobin: 10.3 g/dL — ABNORMAL LOW (ref 13.0–17.0)
Immature Granulocytes: 0 %
Lymphocytes Relative: 27 %
Lymphs Abs: 2.1 10*3/uL (ref 0.7–4.0)
MCH: 25.8 pg — ABNORMAL LOW (ref 26.0–34.0)
MCHC: 30.3 g/dL (ref 30.0–36.0)
MCV: 85 fL (ref 80.0–100.0)
Monocytes Absolute: 0.6 10*3/uL (ref 0.1–1.0)
Monocytes Relative: 8 %
Neutro Abs: 4.7 10*3/uL (ref 1.7–7.7)
Neutrophils Relative %: 64 %
Platelets: 228 10*3/uL (ref 150–400)
RBC: 4 MIL/uL — ABNORMAL LOW (ref 4.22–5.81)
RDW: 20.3 % — ABNORMAL HIGH (ref 11.5–15.5)
WBC: 7.5 10*3/uL (ref 4.0–10.5)
nRBC: 0.3 % — ABNORMAL HIGH (ref 0.0–0.2)

## 2023-09-17 LAB — HEPATIC FUNCTION PANEL
ALT: 15 U/L (ref 0–44)
AST: 20 U/L (ref 15–41)
Albumin: 4 g/dL (ref 3.5–5.0)
Alkaline Phosphatase: 56 U/L (ref 38–126)
Bilirubin, Direct: <0.1 mg/dL (ref 0.0–0.2)
Total Bilirubin: 0.6 mg/dL (ref 0.0–1.2)
Total Protein: 7.3 g/dL (ref 6.5–8.1)

## 2023-09-17 LAB — TROPONIN I (HIGH SENSITIVITY)
Troponin I (High Sensitivity): 5 ng/L (ref <18)
Troponin I (High Sensitivity): 6 ng/L (ref <18)

## 2023-09-17 LAB — BASIC METABOLIC PANEL WITH GFR
Anion gap: 7 (ref 5–15)
BUN: 16 mg/dL (ref 6–20)
CO2: 24 mmol/L (ref 22–32)
Calcium: 8.7 mg/dL — ABNORMAL LOW (ref 8.9–10.3)
Chloride: 105 mmol/L (ref 98–111)
Creatinine, Ser: 0.54 mg/dL — ABNORMAL LOW (ref 0.61–1.24)
GFR, Estimated: >60 mL/min (ref >60)
Glucose, Bld: 109 mg/dL — ABNORMAL HIGH (ref 70–99)
Potassium: 3.9 mmol/L (ref 3.5–5.1)
Sodium: 136 mmol/L (ref 135–145)

## 2023-09-17 LAB — TYPE AND SCREEN
ABO/RH(D): O POS
Antibody Screen: NEGATIVE

## 2023-09-17 LAB — PROTIME-INR
INR: 1 (ref 0.8–1.2)
Prothrombin Time: 13.6 s (ref 11.4–15.2)

## 2023-09-17 LAB — LIPASE, BLOOD: Lipase: 29 U/L (ref 11–51)

## 2023-09-17 LAB — MAGNESIUM: Magnesium: 2.1 mg/dL (ref 1.7–2.4)

## 2023-09-17 MED ORDER — AZITHROMYCIN 250 MG PO TABS: 250.0000 mg | ORAL_TABLET | Freq: Every day | ORAL | 0 refills | Status: DC

## 2023-09-17 MED ORDER — SODIUM CHLORIDE 0.9 % IV SOLN
1.0000 g | Freq: Once | INTRAVENOUS | Status: AC
  Administered 2023-09-17: 1 g via INTRAVENOUS
  Filled 2023-09-17: qty 10

## 2023-09-17 MED ORDER — SODIUM CHLORIDE 0.9 % IV SOLN
500.0000 mg | Freq: Once | INTRAVENOUS | Status: AC
  Administered 2023-09-17: 500 mg via INTRAVENOUS
  Filled 2023-09-17: qty 5

## 2023-09-17 MED ORDER — CEFPODOXIME PROXETIL 200 MG PO TABS: 200.0000 mg | ORAL_TABLET | Freq: Two times a day (BID) | ORAL | 0 refills | Status: AC

## 2023-09-17 MED ORDER — BENZONATATE 100 MG PO CAPS: 100.0000 mg | ORAL_CAPSULE | Freq: Three times a day (TID) | ORAL | 0 refills | Status: DC | PRN

## 2023-09-17 MED ORDER — IOHEXOL 350 MG/ML SOLN
75.0000 mL | Freq: Once | INTRAVENOUS | Status: AC | PRN
  Administered 2023-09-17: 75 mL via INTRAVENOUS

## 2023-09-17 NOTE — ED Notes (Signed)
 Walked pt around ED with pulse oximetry per MD. Pt O2 sats stayed btwn 93%-96%. Pt denies SOB, dizziness, or chest pain. MD notified

## 2023-09-17 NOTE — ED Provider Notes (Signed)
 [place] [facility] AT [facility] Provider Note   CSN: [id number] Arrival date & time: 09/16/23  2354     History  Chief Complaint  Patient presents with   Hemoptysis    Terry Gross is a 58 y.o. male.  HPI Patient presents for hemoptysis.  Medical history includes HLD, tobacco use, PVD, BPH, carotid stenosis, anemia, HTN.  He previously had an episode of hemoptysis last year.  At the time, he was diagnosed with pneumonia and treated with antibiotics.  He had recurrence of hemoptysis a month ago.  He was seen in the ED and underwent CTA.  CTA showed concern of possible AVM.  At the time, he also did have a hemoglobin drop.  He was advised to follow-up with pulmonology and PCP.  PCP started him on oral iron supplement in addition to iron infusions.  He did recently undergo his first iron infusion.  He did follow-up with pulmonology who plans on repeating a CT scan and seeing him again in 3 months.  Earlier today, he was in his normal state of health.  This evening, he had an episode of hemoptysis.  Hemoptysis was bright red in color.  He describes some central chest pain with coughing.  He denies any other current or recent symptoms.    Home Medications Prior to Admission medications   Medication Sig Start Date End Date Taking? Authorizing Provider  azithromycin  (ZITHROMAX ) 250 MG tablet Take 1 tablet (250 mg total) by mouth daily. Take 1 every day until finished. 09/17/23  Yes Iva Mariner, MD  cefpodoxime (VANTIN) 200 MG tablet Take 1 tablet (200 mg total) by mouth 2 (two) times daily for 7 days. 09/17/23 09/24/23 Yes Iva Mariner, MD  brimonidine  (ALPHAGAN ) 0.2 % ophthalmic solution Place 1 drop into both eyes 3 (three) times daily. 06/27/23   [provider]  clopidogrel  (PLAVIX ) 75 MG tablet Take 75 mg by mouth daily.    [provider]  dorzolamide-timolol  (COSOPT) 2-0.5 % ophthalmic solution Place 1 drop into the left eye 2 (two) times  daily. 04/08/22   [provider]  ferrous gluconate  (FERGON) 324 MG tablet Take 1 tablet (324 mg total) by mouth 2 (two) times daily with a meal. 09/05/23   Tysinger, Christiane Cowing, PA-C  latanoprost (XALATAN) 0.005 % ophthalmic solution INSTILL 1 DROP IN LEFT EYE NIGHTLY 04/06/21   Rankin, Alleen Arbour, MD  methocarbamol  (ROBAXIN ) 500 MG tablet Take 1 tablet (500 mg total) by mouth 2 (two) times daily. Patient not taking: Reported on 09/04/2023 08/20/23   Royann Cords, PA  naproxen  (NAPROSYN ) 500 MG tablet Take 1 tablet (500 mg total) by mouth 2 (two) times daily. Patient not taking: Reported on 09/04/2023 08/20/23   Royann Cords, PA  Omega-3 Fatty Acids (FISH OIL) 1200 MG CAPS Take by mouth daily.    [provider]  rosuvastatin  (CRESTOR ) 40 MG tablet Take 1 tablet (40 mg total) by mouth daily. 06/05/23   Tysinger, Christiane Cowing, PA-C  tamsulosin  (FLOMAX ) 0.4 MG CAPS capsule Take 1 capsule (0.4 mg total) by mouth daily after supper. 06/05/23   Tysinger, Christiane Cowing, PA-C      Allergies    Patient has no known allergies.    Review of Systems   Review of Systems  Respiratory:  Positive for cough.   Cardiovascular:  Positive for chest pain.  All other systems reviewed and are negative.   Physical Exam Updated Vital Signs BP 127/66   Pulse 71  Temp 98.1 F (36.7 C) (Oral)   Resp 16   SpO2 95%  Physical Exam Vitals and nursing note reviewed.  Constitutional:      General: He is not in acute distress.    Appearance: Normal appearance. He is well-developed. He is not ill-appearing, toxic-appearing or diaphoretic.  HENT:     Head: Normocephalic and atraumatic.     Right Ear: External ear normal.     Left Ear: External ear normal.     Nose: Nose normal.     Mouth/Throat:     Mouth: Mucous membranes are moist.  Eyes:     Extraocular Movements: Extraocular movements intact.     Conjunctiva/sclera: Conjunctivae normal.  Cardiovascular:     Rate and Rhythm: Normal rate and regular  rhythm.     Heart sounds: No murmur heard. Pulmonary:     Effort: Pulmonary effort is normal. No respiratory distress.     Breath sounds: Normal breath sounds. No wheezing, rhonchi or rales.  Abdominal:     General: There is no distension.     Palpations: Abdomen is soft.     Tenderness: There is no abdominal tenderness.  Musculoskeletal:        General: No swelling. Normal range of motion.     Cervical back: Normal range of motion and neck supple.  Skin:    General: Skin is warm and dry.     Coloration: Skin is not jaundiced or pale.  Neurological:     General: No focal deficit present.     Mental Status: He is alert and oriented to person, place, and time.  Psychiatric:        Mood and Affect: Mood normal.        Behavior: Behavior normal.     ED Results / Procedures / Treatments   Labs (all labs ordered are listed, but only abnormal results are displayed) Labs Reviewed  BASIC METABOLIC PANEL WITH GFR - Abnormal; Notable for the following components:      Result Value   Glucose, Bld 109 (*)    Creatinine, Ser 0.54 (*)    Calcium  8.7 (*)    All other components within normal limits  CBC WITH DIFFERENTIAL/PLATELET - Abnormal; Notable for the following components:   RBC 4.00 (*)    Hemoglobin 10.3 (*)    HCT 34.0 (*)    MCH 25.8 (*)    RDW 20.3 (*)    nRBC 0.3 (*)    All other components within normal limits  MAGNESIUM  LIPASE, BLOOD  HEPATIC FUNCTION PANEL  PROTIME-INR  TYPE AND SCREEN  TROPONIN I (HIGH SENSITIVITY)  TROPONIN I (HIGH SENSITIVITY)    EKG EKG Interpretation Date/Time:  Sunday Sep 17 2023 01:17:26 EDT Ventricular Rate:  74 PR Interval:  179 QRS Duration:  76 QT Interval:  378 QTC Calculation: 420 R Axis:   46  Text Interpretation: Sinus rhythm Abnormal R-wave progression, early transition Confirmed by Iva Mariner ([id number]) on 09/17/2023 1:54:57 AM  Radiology CT Angio Chest PE W and/or Wo Contrast Result Date: 09/17/2023 CLINICAL DATA:   Evaluate for pulmonary embolism.  Acute cough. EXAM: CT ANGIOGRAPHY CHEST WITH CONTRAST TECHNIQUE: Multidetector CT imaging of the chest was performed using the standard protocol during bolus administration of intravenous contrast. Multiplanar CT image reconstructions and MIPs were obtained to evaluate the vascular anatomy. RADIATION DOSE REDUCTION: This exam was performed according to the departmental dose-optimization program which includes automated exposure control, adjustment of the mA and/or kV according to  patient size and/or use of iterative reconstruction technique. CONTRAST:  75mL OMNIPAQUE  IOHEXOL  350 MG/ML SOLN COMPARISON:  08/20/2023. FINDINGS: Cardiovascular: Satisfactory opacification of the pulmonary arteries to the segmental level. No evidence of pulmonary embolism. Normal heart size. No pericardial effusion. Aortic atherosclerosis. Coronary artery calcification. Mediastinum/Nodes: No enlarged mediastinal, hilar, or axillary lymph nodes. Thyroid  gland, trachea, and esophagus demonstrate no significant findings. Lungs/Pleura: No pleural effusion. No pneumothorax. Multifocal patchy areas of ground-glass attenuation noted throughout within the inferior left upper lobe, lingula and left lower lobe. Consolidative change identified within the posterior aspect of the superior segment of left lower lobe, image 59/13. Small enhancing nodule within the superior segment of left lower lobe is unchanged from the previous exam measuring 6 mm, image 56/13. This is favored to represent a small pulmonary AVN. Upper Abdomen: No acute abnormality. Musculoskeletal: No chest wall abnormality. No acute or significant osseous findings. Review of the MIP images confirms the above findings. IMPRESSION: 1. No evidence for acute pulmonary embolus. 2. Multifocal patchy areas of ground-glass attenuation noted throughout within the inferior left upper lobe, lingula and left lower lobe. Consolidative change identified within the  posterior aspect of the superior segment of left lower lobe. Findings are favored to represent multifocal pneumonia. 3. Coronary artery calcifications. 4. Stable 6 mm enhancing nodule within the superior segment of left lower lobe. This is favored to represent a small pulmonary AVN. 5.  Aortic Atherosclerosis (ICD10-I70.0). Electronically Signed   By: Kimberley Penman M.D.   On: 09/17/2023 05:21   DG Chest Portable 1 View Result Date: 09/17/2023 CLINICAL DATA:  Initial evaluation for acute cough. EXAM: PORTABLE CHEST 1 VIEW COMPARISON:  Radiograph from 08/20/2023 FINDINGS: Transverse heart size within normal limits. Mediastinal silhouette normal. Aortic atherosclerosis. Patchy and hazy opacity within the mid and lower left lung, concerning for infiltrate given provided history. Underlying perihilar vascular congestion without pulmonary edema. No pleural effusion. No pneumothorax. Visualized osseous structures demonstrate no acute finding. IMPRESSION: 1. Patchy and hazy opacity within the mid and lower left lung, concerning for infiltrate/pneumonia given provided history. 2.  Aortic Atherosclerosis (ICD10-I70.0). Electronically Signed   By: Virgia Griffins M.D.   On: 09/17/2023 02:21    Procedures Procedures    Medications Ordered in ED Medications  cefTRIAXone (ROCEPHIN) 1 g in sodium chloride  0.9 % 100 mL IVPB (0 g Intravenous Stopped 09/17/23 0437)  azithromycin  (ZITHROMAX ) 500 mg in sodium chloride  0.9 % 250 mL IVPB (0 mg Intravenous Stopped 09/17/23 0555)  iohexol  (OMNIPAQUE ) 350 MG/ML injection 75 mL (75 mLs Intravenous Contrast Given 09/17/23 6433)    ED Course/ Medical Decision Making/ A&P                                 Medical Decision Making Amount and/or Complexity of Data Reviewed Labs: ordered. Radiology: ordered.  Risk Prescription drug management.   This patient presents to the ED for concern of hemoptysis, this involves an extensive number of treatment options, and is a  complaint that carries with it a high risk of complications and morbidity.  The differential diagnosis includes pneumonia, bronchitis, tuberculosis, neoplasm, pulmonary AVM, PE   Co morbidities that complicate the patient evaluation  HLD, tobacco use, PVD, BPH, carotid stenosis, anemia, HTN   Additional history obtained:  Additional history obtained from N/A External records from outside source obtained and reviewed including EMR   Lab Tests:  I Ordered, and personally interpreted labs.  The  pertinent results include: Hemoglobin has improved from 4 weeks ago, no leukocytosis is present.  Kidney function and electrolytes are normal.  INR and platelets are normal.   Imaging Studies ordered:  I ordered imaging studies including x-ray, CTA chest I independently visualized and interpreted imaging which showed multifocal pneumonia I agree with the radiologist interpretation   Cardiac Monitoring: / EKG:  The patient was maintained on a cardiac monitor.  I personally viewed and interpreted the cardiac monitored which showed an underlying rhythm of: Sinus rhythm   Problem List / ED Course / Critical interventions / Medication management  Patient presents for hemoptysis.  Onset was this evening.  In the past year, he has had other episodes of hemoptysis.  Tonight's episode consisted of bright red blood.  During transit to the hospital, he did cough up the blood shown below:  He had a similar amount at home.  He describes a sharp pain in his chest with coughing.  When taking a deep inspiration, he also endorses some discomfort in this area.  When not coughing or breathing deeply, he is asymptomatic.  Workup was initiated.  X-ray showed evidence of pneumonia.  Antibiotics were initiated.  Patient's lab work is reassuring.  His hemoglobin is improving since his ED visit 1 month ago.  CTA showed multifocal pneumonia with redemonstration of possible AVM.  Either of these could be the source of  his hemoptysis.  There were no imaging findings to suggest tuberculosis.  Patient states that he has had some weight loss but that it has been intentional and due to clean diet.  He had no episodes of hemoptysis during 6-hour ED observation.  SpO2 remained normal on room air, including with ambulation.  He did not have any increased work of breathing with ambulation.  Patient does appear to be stable for discharge home.  He does have a pulmonology follow-up scheduled in 2 months.  He was prescribed outpatient antibiotics and advised to return for any worsening of symptoms.  He was discharged in stable condition. I ordered medication including ceftriaxone and azithromycin  for multifocal pneumonia Reevaluation of the patient after these medicines showed that the patient improved I have reviewed the patients home medicines and have made adjustments as needed  Social Determinants of Health:  Lives at home with wife, has access to outpatient care        Final Clinical Impression(s) / ED Diagnoses Final diagnoses:  Hemoptysis  Multifocal pneumonia    Rx / DC Orders ED Discharge Orders          Ordered    azithromycin  (ZITHROMAX ) 250 MG tablet  Daily        09/17/23 0606    cefpodoxime (VANTIN) 200 MG tablet  2 times daily        09/17/23 0606              Iva Mariner, MD 09/17/23 [phone]

## 2023-09-17 NOTE — Discharge Instructions (Addendum)
 Your imaging studies today showed pneumonia.  You received your first doses of antibiotics while in the emergency department.  This will cover you until tomorrow.  Prescriptions for ongoing antibiotics were sent to your pharmacy.  Take as prescribed.  Take cough medicine as needed.  Follow-up with your pulmonologist.  Return to the emergency department for any new or worsening symptoms of concern.

## 2023-09-19 ENCOUNTER — Telehealth: Payer: Self-pay | Admitting: Medical

## 2023-09-19 NOTE — Telephone Encounter (Signed)
 Pt came in and states he had his infusion last week and was told to come here for labs after, I don't see any labs in the system. Please advise.

## 2023-09-20 ENCOUNTER — Other Ambulatory Visit: Payer: Self-pay | Admitting: Medical

## 2023-09-20 DIAGNOSIS — E611 Iron deficiency: Secondary | ICD-10-CM

## 2023-09-20 DIAGNOSIS — H348121 Central retinal vein occlusion, left eye, with retinal neovascularization: Secondary | ICD-10-CM | POA: Diagnosis not present

## 2023-09-20 NOTE — Progress Notes (Signed)
 Cbc

## 2023-09-27 DIAGNOSIS — H348121 Central retinal vein occlusion, left eye, with retinal neovascularization: Secondary | ICD-10-CM | POA: Diagnosis not present

## 2023-11-01 ENCOUNTER — Other Ambulatory Visit: Payer: Self-pay

## 2023-11-01 DIAGNOSIS — I350 Nonrheumatic aortic (valve) stenosis: Secondary | ICD-10-CM

## 2023-11-01 DIAGNOSIS — I351 Nonrheumatic aortic (valve) insufficiency: Secondary | ICD-10-CM

## 2023-11-02 ENCOUNTER — Other Ambulatory Visit: Payer: Self-pay

## 2023-11-02 ENCOUNTER — Other Ambulatory Visit

## 2023-11-02 ENCOUNTER — Ambulatory Visit (HOSPITAL_COMMUNITY): Payer: BC Managed Care – PPO

## 2023-11-02 ENCOUNTER — Other Ambulatory Visit: Payer: 59

## 2023-11-02 DIAGNOSIS — R5383 Other fatigue: Secondary | ICD-10-CM

## 2023-11-02 DIAGNOSIS — D509 Iron deficiency anemia, unspecified: Secondary | ICD-10-CM

## 2023-11-02 NOTE — Addendum Note (Signed)
 Addended by: BULAH ALM RAMAN on: 11/02/2023 11:25 AM   Modules accepted: Orders

## 2023-11-03 ENCOUNTER — Ambulatory Visit: Payer: Self-pay | Admitting: Medical

## 2023-11-03 LAB — IRON,TIBC AND FERRITIN PANEL
Ferritin: 129 ng/mL (ref 30–400)
Iron Saturation: 11 % — ABNORMAL LOW (ref 15–55)
Iron: 28 ug/dL — ABNORMAL LOW (ref 38–169)
Total Iron Binding Capacity: 252 ug/dL (ref 250–450)
UIBC: 224 ug/dL (ref 111–343)

## 2023-11-03 LAB — TSH: TSH: 0.206 u[IU]/mL — ABNORMAL LOW (ref 0.450–4.500)

## 2023-11-03 NOTE — Progress Notes (Signed)
 Iron is still low.  Lets set up for iron infusion with Venofer at the day x 2 doses a month apart  I would put him on the schedule for 45 days from now for office visit and recheck

## 2023-11-03 NOTE — Progress Notes (Signed)
 Thyroid  still abnormal.  I had made a referral to endocrinology/thyroid  doctor earlier in the year.  I cannot see where he saw the endocrinologist.  What he is the status of this referral?

## 2023-11-06 ENCOUNTER — Telehealth: Payer: Self-pay | Admitting: Internal Medicine

## 2023-11-06 NOTE — Addendum Note (Signed)
 Addended by: VICCI HUSBAND A on: 11/06/2023 05:07 PM   Modules accepted: Orders

## 2023-11-06 NOTE — Telephone Encounter (Signed)
-----   Message from [facility] sent at 11/06/2023  4:16 PM EDT ----- Set up for iron infusion x 2 doses a month apart.  Jon - see Samule about this for future use to know how these are done

## 2023-11-07 ENCOUNTER — Telehealth: Payer: Self-pay | Admitting: Medical

## 2023-11-07 ENCOUNTER — Telehealth: Payer: Self-pay | Admitting: Internal Medicine

## 2023-11-07 ENCOUNTER — Telehealth: Payer: Self-pay

## 2023-11-07 NOTE — Telephone Encounter (Signed)
 Copied from CRM [id number]. Topic: General - Other >> Nov 07, 2023 12:43 PM Fredrica W wrote: Reason for CRM: Patient called. Request a callback from [place]. Would like provide details of what call was for. At first stated it was about labs and then that he needs help with seeing another doctor asap. Thank You    ----------------------------------------------------------------------- From previous Reason for Contact - Lab/Test Results: Reason for CRM: Patient called. Did not

## 2023-11-07 NOTE — Telephone Encounter (Signed)
 Pt was notified about doing the iron infusions twice a week for 5 doses and then he has an endocrinology appt on 12/14/23 with Dr. Donnice Lipps with [facility]. Pt was given phone number to call and verify location and time of appt.

## 2023-11-07 NOTE — Telephone Encounter (Addendum)
 Hello,  Patient will be scheduled as soon as possible.  Auth Submission: NO AUTH NEEDED Site of care: Site of care: [facility] Payer: [employer] ppo Medication & CPT/J Code(s) submitted: Venofer (Iron Sucrose) J1756 Diagnosis Code:  Route of submission (phone, fax, portal): phone Phone # Fax # Auth type: Buy/Bill PB Units/visits requested: 200mg  twice weekly x 3 weeks Reference number:  Approval from: 11/07/23 to 05/08/24

## 2023-11-07 NOTE — Telephone Encounter (Signed)
 Terry Gross,  I actually need to edit the dose of the Venofer. We need to do 200mg  twice a week for 3 weeks. How do I edit the current treatment plan. Alm Gent talked with Pharmacist and this is he would like for patient

## 2023-11-07 NOTE — Telephone Encounter (Signed)
 Spoke w/ Alm Gent, PA and he would like patient to receive Venofer 200 mg IV twice weekly for 5 doses.Will update plan   Azucena Dart D. Merrill Deanda, PharmD

## 2023-11-08 NOTE — Telephone Encounter (Signed)
Done, Thanks.

## 2023-11-15 ENCOUNTER — Ambulatory Visit

## 2023-11-15 VITALS — BP 107/67 | HR 65 | Temp 98.3°F | Resp 16 | Ht 68.0 in | Wt 143.2 lb

## 2023-11-15 DIAGNOSIS — D509 Iron deficiency anemia, unspecified: Secondary | ICD-10-CM

## 2023-11-15 MED ORDER — IRON SUCROSE 20 MG/ML IV SOLN
200.0000 mg | Freq: Once | INTRAVENOUS | Status: AC
  Administered 2023-11-15: 200 mg via INTRAVENOUS
  Filled 2023-11-15: qty 10

## 2023-11-15 NOTE — Telephone Encounter (Signed)
 Left detailed message about the reasoning for doing 5 doses (2 times a week) versus just 2 doses and done. 2 doses was not going to benefit much as 5 doses will benefit and see a difference in the treatment. We want to make sure he get the correct doses he needs and 2 doses was not enough so that's why this was increased.   Also spoke to patient and explained to him on 11/07/23 of this.

## 2023-11-15 NOTE — Progress Notes (Signed)
 Diagnosis: Iron Deficiency Anemia  Provider:  Chilton Greathouse MD  Procedure: IV Push  IV Type: Peripheral, IV Location: L Forearm  Venofer (Iron Sucrose), Dose: 200 mg  Post Infusion IV Care: Observation period completed and Peripheral IV Discontinued  Discharge: Condition: Good, Destination: Home . AVS Provided  Performed by:  Loney Hering, LPN

## 2023-11-15 NOTE — Telephone Encounter (Signed)
 Patient called requesting a call back about getting 6 infusions when she was originally told he would only need 2 infusions. Please f/u with patient

## 2023-11-16 NOTE — Telephone Encounter (Signed)
 Pt walked in office today. He is wanting to know if he can or should still take iron  supplements along with getting the iron  infusions?

## 2023-11-23 ENCOUNTER — Ambulatory Visit: Admitting: *Deleted

## 2023-11-23 ENCOUNTER — Encounter: Payer: BC Managed Care – PPO | Admitting: Medical

## 2023-11-23 VITALS — BP 109/64 | HR 67 | Temp 98.0°F | Resp 16 | Ht 68.0 in | Wt 142.0 lb

## 2023-11-23 DIAGNOSIS — D509 Iron deficiency anemia, unspecified: Secondary | ICD-10-CM | POA: Diagnosis not present

## 2023-11-23 MED ORDER — IRON SUCROSE 20 MG/ML IV SOLN
200.0000 mg | Freq: Once | INTRAVENOUS | Status: AC
  Administered 2023-11-23: 200 mg via INTRAVENOUS
  Filled 2023-11-23: qty 10

## 2023-11-23 NOTE — Progress Notes (Signed)
 Diagnosis: Iron  Deficiency Anemia  Provider:  Praveen Mannam MD  Procedure: IV Push  IV Type: Peripheral, IV Location:L Wrist   Venofer  (Iron  Sucrose), Dose: 200 mg  Post Infusion IV Care: Observation period completed and Peripheral IV Discontinued  Discharge: Condition: Good, Destination: Home . AVS Provided  Performed by:  Trudy Lamarr LABOR, RN

## 2023-11-27 ENCOUNTER — Ambulatory Visit (HOSPITAL_BASED_OUTPATIENT_CLINIC_OR_DEPARTMENT_OTHER)
Admission: RE | Admit: 2023-11-27 | Discharge: 2023-11-27 | Disposition: A | Discharge status: Home / Self Care | Source: Ambulatory Visit | Attending: Pulmonary Disease | Admitting: Pulmonary Disease

## 2023-11-27 DIAGNOSIS — R0609 Other forms of dyspnea: Secondary | ICD-10-CM | POA: Diagnosis present

## 2023-11-30 ENCOUNTER — Ambulatory Visit: Payer: Self-pay | Admitting: Cardiology

## 2023-11-30 ENCOUNTER — Ambulatory Visit

## 2023-11-30 VITALS — BP 110/72 | HR 65 | Temp 97.8°F | Resp 18 | Ht 68.0 in | Wt 142.8 lb

## 2023-11-30 DIAGNOSIS — D509 Iron deficiency anemia, unspecified: Secondary | ICD-10-CM

## 2023-11-30 MED ORDER — IRON SUCROSE 20 MG/ML IV SOLN
200.0000 mg | Freq: Once | INTRAVENOUS | Status: AC
  Administered 2023-11-30: 200 mg via INTRAVENOUS
  Filled 2023-11-30: qty 10

## 2023-11-30 NOTE — Progress Notes (Signed)
 Diagnosis: Iron  Deficiency Anemia  Provider:  Praveen Mannam MD  Procedure: IV Push  IV Type: Peripheral, IV Location: R Antecubital  Venofer (Iron  Sucrose), Dose: 200 mg  Post Infusion IV Care: Observation period completed  Discharge: Condition: Good, Destination: Home . AVS Declined  Performed by:  Rachelle Bue, RN

## 2023-12-05 ENCOUNTER — Ambulatory Visit (INDEPENDENT_AMBULATORY_CARE_PROVIDER_SITE_OTHER)

## 2023-12-05 VITALS — BP 111/67 | HR 68 | Temp 98.0°F | Resp 18 | Ht 68.0 in | Wt 140.8 lb

## 2023-12-05 DIAGNOSIS — D509 Iron deficiency anemia, unspecified: Secondary | ICD-10-CM

## 2023-12-05 MED ORDER — IRON SUCROSE 20 MG/ML IV SOLN
200.0000 mg | Freq: Once | INTRAVENOUS | Status: AC
Start: 2023-12-05 — End: 2023-12-05
  Administered 2023-12-05: 200 mg via INTRAVENOUS
  Filled 2023-12-05: qty 10

## 2023-12-05 NOTE — Progress Notes (Signed)
 Diagnosis: Iron Deficiency Anemia  Provider:  Chilton Greathouse MD  Procedure: IV Push  IV Type: Peripheral, IV Location: L Forearm  Venofer (Iron Sucrose), Dose: 200 mg  Post Infusion IV Care: Observation period completed and Peripheral IV Discontinued  Discharge: Condition: Good, Destination: Home . AVS Declined  Performed by:  Adriana Mccallum, RN

## 2023-12-07 ENCOUNTER — Ambulatory Visit (INDEPENDENT_AMBULATORY_CARE_PROVIDER_SITE_OTHER): Admitting: Pulmonary Disease

## 2023-12-07 ENCOUNTER — Encounter: Payer: Self-pay | Admitting: Pulmonary Disease

## 2023-12-07 ENCOUNTER — Ambulatory Visit: Admitting: Pulmonary Disease

## 2023-12-07 VITALS — BP 134/60 | HR 65 | Temp 98.4°F | Ht 68.0 in | Wt 141.6 lb

## 2023-12-07 DIAGNOSIS — R042 Hemoptysis: Secondary | ICD-10-CM | POA: Diagnosis not present

## 2023-12-07 DIAGNOSIS — J849 Interstitial pulmonary disease, unspecified: Secondary | ICD-10-CM

## 2023-12-07 DIAGNOSIS — J432 Centrilobular emphysema: Secondary | ICD-10-CM | POA: Diagnosis not present

## 2023-12-07 DIAGNOSIS — Q2730 Arteriovenous malformation, site unspecified: Secondary | ICD-10-CM | POA: Diagnosis not present

## 2023-12-07 DIAGNOSIS — R0609 Other forms of dyspnea: Secondary | ICD-10-CM

## 2023-12-07 LAB — PULMONARY FUNCTION TEST
DL/VA % pred: 81 %
DL/VA: 3.47 ml/min/mmHg/L
DLCO unc % pred: 64 %
DLCO unc: 16.68 ml/min/mmHg
FEF 25-75 Post: 0.7 L/s
FEF 25-75 Pre: 1.32 L/s
FEF2575-%Change-Post: -46 %
FEF2575-%Pred-Post: 25 %
FEF2575-%Pred-Pre: 47 %
FEV1-%Change-Post: -13 %
FEV1-%Pred-Post: 55 %
FEV1-%Pred-Pre: 63 %
FEV1-Post: 1.86 L
FEV1-Pre: 2.15 L
FEV1FVC-%Change-Post: -8 %
FEV1FVC-%Pred-Pre: 91 %
FEV6-%Change-Post: -5 %
FEV6-%Pred-Post: 68 %
FEV6-%Pred-Pre: 72 %
FEV6-Post: 2.9 L
FEV6-Pre: 3.07 L
FEV6FVC-%Change-Post: 0 %
FEV6FVC-%Pred-Post: 103 %
FEV6FVC-%Pred-Pre: 103 %
FVC-%Change-Post: -5 %
FVC-%Pred-Post: 66 %
FVC-%Pred-Pre: 70 %
FVC-Post: 2.94 L
FVC-Pre: 3.11 L
Post FEV1/FVC ratio: 63 %
Post FEV6/FVC ratio: 99 %
Pre FEV1/FVC ratio: 69 %
Pre FEV6/FVC Ratio: 99 %
RV % pred: 162 %
RV: 3.47 L
TLC % pred: 99 %
TLC: 6.54 L

## 2023-12-07 MED ORDER — NICOTINE 14 MG/24HR TD PT24: 14.0000 mg | MEDICATED_PATCH | Freq: Every day | TRANSDERMAL | 0 refills | Status: DC

## 2023-12-07 MED ORDER — VARENICLINE TARTRATE (STARTER) 0.5 MG X 11 & 1 MG X 42 PO TBPK: 1.0000 | ORAL_TABLET | Freq: Every day | ORAL | 2 refills | Status: DC

## 2023-12-07 NOTE — Progress Notes (Signed)
 Full pft performed today.

## 2023-12-07 NOTE — Patient Instructions (Signed)
  VISIT SUMMARY: During your visit, we discussed your recurrent episodes of hemoptysis (coughing up blood), pneumonia, emphysema, and smoking habits. We reviewed your recent CT scan and discussed the findings related to your lung health. We also talked about your iron  deficiency anemia and the treatments you are receiving for it.  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 We will consult with interventional radiology to see if the AVM can be treated with a procedure called embolization. If that is not possible, surgery may be considered.  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 Quitting smoking is crucial to prevent further lung damage. We will prescribe nicotine  patches and Chantix  to help you quit smoking. Please choose a quit date one week before starting these medications. We will also check your alpha-1 antitrypsin level to rule out any deficiency.  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 We will monitor this condition with a follow-up CT scan in one year and perform blood tests to check for autoimmune diseases that might be causing it.  INSTRUCTIONS: Please follow up with interventional radiology for the possible embolization of your AVM. Choose a quit date for smoking cessation and start the prescribed medications one week before that date. Schedule a follow-up CT scan in one year to monitor your lung condition and complete the blood tests as ordered.                      Contains text generated by [employer]bridge.                                 Contains text generated by Abridge.

## 2023-12-07 NOTE — Patient Instructions (Signed)
 Full pft performed today.

## 2023-12-07 NOTE — Progress Notes (Signed)
 Terry Gross    [id number]    March 26, 1966  Primary Care Physician:Tysinger, Alm RAMAN, PA-C  Referring Physician: Rajanee Schuelke, MD [address] [place],  [place] [address]  Chief complaint: Follow-up for emphysema, hemoptysis, pulmonary AVM  HPI: 58 y.o. who  has a past medical history of Aortic atherosclerosis (HCC), Aortic valve insufficiency (12/15/2014), Back pain, Carotid artery occlusion, Cataract, Hyperlipidemia, Hypertension, Impaired fasting blood sugar, Left epiretinal membrane (09/12/2019), Loss of balance, Peripheral vascular disease (HCC), Smoker, and Vitreous hemorrhage of left eye (HCC) (06/10/2019).  Discussed the use of AI scribe software for clinical note transcription with the patient, who gave verbal consent to proceed.  History of Present Illness Terry Gross is a 58 year old male with emphysema who presents with hemoptysis. He was referred by an emergency room physician for follow-up of a possible AVM in the lung.  In July 2024, he experienced a mild cough with sputum containing a small amount of blood, prompting an emergency room visit. A CT scan revealed a bacterial infection in the lower left lung, diagnosed as pneumonia, and he was treated with two antibiotics for seven days. After completing the treatment, he noticed a small amount of blood in his sputum again, leading to a follow-up visit where an x-ray showed resolution of the pneumonia. He was given cough medication and reported no further symptoms until recently.  On August 20, 2023, he experienced another episode of coughing with a small amount of blood in the sputum, leading to another emergency room visit. A CT scan and blood tests were performed, and he was informed of a possible arterial venous malformation (AVM) in the lung, but no immediate treatment was provided. He has not experienced further hemoptysis since that episode.  He has a history of emphysema, which was noted on  previous CT scans. He has been attempting to quit smoking, especially after being diagnosed with a blocked left carotid artery in 2020, for which he takes Plavix  and undergoes annual ultrasounds.  No current chest pain or further episodes of hemoptysis. He mentioned a history of a blocked carotid artery and is under the care of a vascular doctor.  Interim History: Terry Gross is a 58 year old male with recurrent pneumonia and emphysema who presents with hemoptysis.  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  Iron  deficiency anemia - Receiving iron  infusions for iron  deficiency anemia - Anemia attributed to low blood counts during last pneumonia episode  Emphysema and tobacco use - Significant emphysema with diminished lung function - Currently smokes four to five cigarettes per day - Previous attempts at smoking cessation with nicotine  patches and Chantix  - Desires to quit smoking, with stress identified as a trigger - Not currently using any inhalers, though previously provided during lung function testing  Relevant pulmonary history Pets: Cats 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  No feather pillows or comforters Smoking history: Smoker since 1985.  Used to smoke a pack per day.  Now smokes 4 to 5 cigarettes/day Travel history: Originally from  [place].  Has been in [place]  since 1991 Relevant family history: No family history of lung disease   Outpatient Encounter Medications as of 12/07/2023  Medication Sig   brimonidine  (ALPHAGAN ) 0.2 % ophthalmic solution Place 1 drop into both eyes 3 (three) times daily.   clopidogrel  (PLAVIX ) 75 MG tablet Take 75 mg by mouth daily.   dorzolamide-timolol  (COSOPT) 2-0.5 % ophthalmic solution Place 1 drop into the left eye 2 (two) times daily.   ferrous gluconate  (FERGON) 324 MG tablet Take 1 tablet (324 mg total) by mouth 2 (two) times daily with a meal.   latanoprost (XALATAN) 0.005 % ophthalmic solution INSTILL 1 DROP IN LEFT EYE NIGHTLY   Omega-3 Fatty Acids (FISH OIL) 1200 MG CAPS Take by mouth daily.   rosuvastatin  (CRESTOR ) 40 MG tablet Take 1 tablet (40 mg total) by mouth daily.   tamsulosin  (FLOMAX ) 0.4 MG CAPS capsule Take 1 capsule (0.4 mg total) by mouth daily after supper.   azithromycin  (ZITHROMAX ) 250 MG tablet Take 1 tablet (250 mg total) by mouth daily. Take 1 every day until finished.   benzonatate  (TESSALON ) 100 MG capsule Take 1 capsule (100 mg total) by mouth 3 (three) times daily as needed for cough.   methocarbamol  (ROBAXIN ) 500 MG tablet Take 1 tablet (500 mg total) by mouth 2 (two) times daily. (Patient not taking: Reported on 09/04/2023)   naproxen  (NAPROSYN ) 500 MG tablet Take 1 tablet (500 mg total) by mouth 2 (two) times daily. (Patient not taking: Reported on 09/04/2023)   No facility-administered encounter medications on file as of 12/07/2023.   Today's Vitals   12/07/23 1100  BP: 134/60  Pulse: 65  Temp: 98.4 F (36.9 C)  TempSrc: Oral  SpO2: 97%  Weight: 141 lb 9.6 oz (64.2 kg)  Height: 5' 8 (1.727 m)   Body mass index is 21.53 kg/m.   Physical Exam GEN: No acute distress CV: Regular rate and rhythm no murmurs LUNGS: Clear to auscultation bilaterally normal respiratory effort SKIN JOINTS: Warm and dry no rash  Data Reviewed: Imaging:  CTA  12/28/2022-no evidence of pulmonary embolism, left lower lobe pneumonia, bronchial wall thickening, mild to moderate paraseptal and centrilobular emphysema  CTA 08/20/2023-negative for PE, 7 mm subpleural focus in the superior segment of left lower lobe.  Possible small AVM, emphysema.  CTA 09/17/2023-no PE, multifocal groundglass attenuation in the left lung.  High-res CT chest 11/27/2023-interval resolution of previously seen groundglass opacities in the left lung, minimal irregular nonspecific groundglass with no significant evidence of ILD, moderate emphysema, stable left lower lobe nodule with feeding vessels consistent with small pulmonary AVM. 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 CT showed opacities in the left lower lung, possibly due to pneumonia or bleeding from a small pulmonary AVM. Differential diagnosis includes recurrent pneumonia versus bleeding from the AVM. - Consult interventional radiology for possible embolization of the AVM. - Consider surgical resection if embolization is not feasible.  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 Tobacco use is contributing to emphysema. Smoking cessation is critical to prevent further lung damage. He has tried nicotine  patches and Chantix  previously and is currently experiencing stress-related challenges in quitting smoking. - Prescribe nicotine  patches and Chantix  to aid in smoking cessation. - Encourage selection of a quit date one week before starting medications. - Order alpha-1  antitrypsin level to rule out deficiency.  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 Differential includes inflammation from recurrent pneumonia or early interstitial lung disease. - Order follow-up CT scan in one year to monitor for progression of interstitial changes. - Order blood tests to evaluate for autoimmune diseases that may cause interstitial lung disease.  Iron  deficiency anemia Started on iron  infusion by primary care.  Not sure if it is related to hemoptysis as the coughing up blood is minimal May need a colonoscopy as he is due for a screening test.  Told him to follow-up with his primary care  Recommendations: Smoking cessation Follow-up high-res CT in 1 year Check alpha-1 antitrypsin, ANA, rheumatoid factor, CCP Referral to interventional radiology for consideration of embolization  Lonna Coder MD [facility] 12/07/2023, 11:13 AM  CC: Maleni Seyer, MD

## 2023-12-08 ENCOUNTER — Ambulatory Visit

## 2023-12-08 VITALS — BP 108/64 | HR 67 | Temp 98.0°F | Resp 16 | Ht 68.0 in | Wt 141.8 lb

## 2023-12-08 DIAGNOSIS — D509 Iron deficiency anemia, unspecified: Secondary | ICD-10-CM | POA: Diagnosis not present

## 2023-12-08 MED ORDER — IRON SUCROSE 20 MG/ML IV SOLN
200.0000 mg | Freq: Once | INTRAVENOUS | Status: AC
  Administered 2023-12-08: 200 mg via INTRAVENOUS
  Filled 2023-12-08: qty 10

## 2023-12-08 NOTE — Progress Notes (Signed)
 Diagnosis: Acute Anemia  Provider:  Chilton Greathouse MD  Procedure: IV Push  IV Type: Peripheral, IV Location: L Forearm  Venofer (Iron Sucrose), Dose: 200 mg  Post Infusion IV Care: Observation period completed and Peripheral IV Discontinued  Discharge: Condition: Good, Destination: Home . AVS Declined  Performed by:  Nat Math, RN

## 2023-12-13 LAB — ALPHA-1 ANTITRYPSIN PHENOTYPE: A-1 Antitrypsin, Ser: 147 mg/dL (ref 83–199)

## 2023-12-13 LAB — ANTI-NUCLEAR AB-TITER (ANA TITER): ANA Titer 1: 1:80 {titer} — ABNORMAL HIGH

## 2023-12-13 LAB — ANA,IFA RA DIAG PNL W/RFLX TIT/PATN
Anti Nuclear Antibody (ANA): POSITIVE — AB
Cyclic Citrullin Peptide Ab: <16 U
Rheumatoid fact SerPl-aCnc: <10 [IU]/mL (ref <14)

## 2023-12-14 ENCOUNTER — Telehealth: Payer: Self-pay

## 2023-12-14 ENCOUNTER — Other Ambulatory Visit (HOSPITAL_BASED_OUTPATIENT_CLINIC_OR_DEPARTMENT_OTHER)

## 2023-12-14 ENCOUNTER — Ambulatory Visit: Payer: 59 | Admitting: Cardiology

## 2023-12-14 DIAGNOSIS — E0590 Thyrotoxicosis, unspecified without thyrotoxic crisis or storm: Secondary | ICD-10-CM | POA: Diagnosis not present

## 2023-12-14 DIAGNOSIS — E041 Nontoxic single thyroid nodule: Secondary | ICD-10-CM | POA: Diagnosis not present

## 2023-12-14 NOTE — Telephone Encounter (Signed)
 Copied from CRM #[id number]. Topic: Clinical - Prescription Issue >> Dec 13, 2023  1:31 PM Celestine FALCON wrote: Reason for CRM: Pt stated he went to his preferred pharmacy for both the medications Varenicline  Tartrate, Starter, (CHANTIX  STARTING MONTH PAK) 0.5 MG X 11 & 1 MG X 42 TBPK [[id number]] and nicotine  (NICODERM CQ  - DOSED IN MG/24 HOURS) 14 mg/24hr patch [[id number]] to pick them up after his visit with Dr. Theophilus, but he was being asked how he would use the medication and why he needed it. Pt was able to get the patches, but not the starter kit.   Pt's preferred pharmacy: [id number] [address], [place] - [address] AT [facility] OF [address], [place] [address] Phone: [phone]  Fax: [phone] DEA #: [id number]  Pt's phone number: [phone]   Pt would like assistance with obtaining his medication and sorting out any misunderstandings.    Spoke with pharmacy they filling under generic name - ready for pick up after  9am Saturday -  Tried to call patient / no answer -LM     NFN-

## 2023-12-15 DIAGNOSIS — H4052X3 Glaucoma secondary to other eye disorders, left eye, severe stage: Secondary | ICD-10-CM | POA: Diagnosis not present

## 2023-12-15 DIAGNOSIS — H348121 Central retinal vein occlusion, left eye, with retinal neovascularization: Secondary | ICD-10-CM | POA: Diagnosis not present

## 2023-12-18 ENCOUNTER — Telehealth: Payer: Self-pay

## 2023-12-18 ENCOUNTER — Ambulatory Visit (HOSPITAL_COMMUNITY)
Admission: RE | Admit: 2023-12-18 | Discharge: 2023-12-18 | Disposition: A | Discharge status: Home / Self Care | Source: Ambulatory Visit | Attending: Cardiology | Admitting: Cardiology

## 2023-12-18 DIAGNOSIS — I351 Nonrheumatic aortic (valve) insufficiency: Secondary | ICD-10-CM

## 2023-12-18 DIAGNOSIS — I350 Nonrheumatic aortic (valve) stenosis: Secondary | ICD-10-CM | POA: Diagnosis present

## 2023-12-18 DIAGNOSIS — I5030 Unspecified diastolic (congestive) heart failure: Secondary | ICD-10-CM

## 2023-12-18 LAB — ECHOCARDIOGRAM COMPLETE
AR max vel: 1.44 cm2
AV Area VTI: 1.4 cm2
AV Area mean vel: 1.36 cm2
AV Mean grad: 37 mmHg
AV Peak grad: 62.7 mmHg
Ao pk vel: 3.96 m/s
Area-P 1/2: 2.87 cm2
P 1/2 time: 417 ms
S' Lateral: 2.7 cm

## 2023-12-18 NOTE — Telephone Encounter (Signed)
 Copied from CRM [id number]. Topic: Clinical - Prescription Issue >> Dec 18, 2023  1:19 PM Terry Gross wrote: Reason for CRM: PT STATED HE HAS BEEN TO THE PHARMACY SEVERAL TIMES FOR THIS MEDICINE Varenicline  Tartrate AND THE PHARMACIST STATED THEY WILL NOT GIVE HIM THE MED BECAUSE THEY DO NOT HAVE INSTRUCTIONS TO GIVE HIM ON HOW TO USE.    Spoke with pharmacy everything was correct unsure why pt was unable to pick up and spoke with PT to let him know he can get his Rx    NFN-

## 2023-12-20 ENCOUNTER — Other Ambulatory Visit (HOSPITAL_COMMUNITY): Payer: Self-pay | Admitting: Endocrinology

## 2023-12-20 ENCOUNTER — Ambulatory Visit: Payer: Self-pay | Admitting: Cardiology

## 2023-12-20 DIAGNOSIS — E0590 Thyrotoxicosis, unspecified without thyrotoxic crisis or storm: Secondary | ICD-10-CM

## 2023-12-20 DIAGNOSIS — E041 Nontoxic single thyroid nodule: Secondary | ICD-10-CM

## 2023-12-20 NOTE — Progress Notes (Signed)
 Normal heart function. Moderate narrowing and leakiness of aortic valve, stable from before. Will discuss more during upcoming office visit next months.  Thanks MJP

## 2023-12-25 NOTE — Progress Notes (Signed)
 MyChart message containing providers result note and interpretation read by patient on: Last read by [name] at 3:37PM on 12/23/2023.

## 2023-12-27 ENCOUNTER — Encounter: Payer: Self-pay | Admitting: Medical

## 2023-12-27 ENCOUNTER — Ambulatory Visit (INDEPENDENT_AMBULATORY_CARE_PROVIDER_SITE_OTHER): Admitting: Medical

## 2023-12-27 VITALS — BP 116/74 | HR 84 | Ht 68.0 in | Wt 142.0 lb

## 2023-12-27 DIAGNOSIS — I351 Nonrheumatic aortic (valve) insufficiency: Secondary | ICD-10-CM

## 2023-12-27 DIAGNOSIS — H61893 Other specified disorders of external ear, bilateral: Secondary | ICD-10-CM

## 2023-12-27 DIAGNOSIS — Z125 Encounter for screening for malignant neoplasm of prostate: Secondary | ICD-10-CM

## 2023-12-27 DIAGNOSIS — E782 Mixed hyperlipidemia: Secondary | ICD-10-CM

## 2023-12-27 DIAGNOSIS — D509 Iron deficiency anemia, unspecified: Secondary | ICD-10-CM

## 2023-12-27 DIAGNOSIS — I8393 Asymptomatic varicose veins of bilateral lower extremities: Secondary | ICD-10-CM

## 2023-12-27 DIAGNOSIS — R7301 Impaired fasting glucose: Secondary | ICD-10-CM

## 2023-12-27 DIAGNOSIS — Z0000 Encounter for general adult medical examination without abnormal findings: Secondary | ICD-10-CM

## 2023-12-27 DIAGNOSIS — I700 Atherosclerosis of aorta: Secondary | ICD-10-CM | POA: Diagnosis not present

## 2023-12-27 DIAGNOSIS — Z720 Tobacco use: Secondary | ICD-10-CM

## 2023-12-27 DIAGNOSIS — N400 Enlarged prostate without lower urinary tract symptoms: Secondary | ICD-10-CM

## 2023-12-27 DIAGNOSIS — E041 Nontoxic single thyroid nodule: Secondary | ICD-10-CM

## 2023-12-27 LAB — HEMOGLOBIN A1C
Est. average glucose Bld gHb Est-mCnc: 117 mg/dL
Hgb A1c MFr Bld: 5.7 % — ABNORMAL HIGH (ref 4.8–5.6)

## 2023-12-27 LAB — LIPID PANEL

## 2023-12-27 NOTE — Progress Notes (Signed)
 Subjective:   HPI  Traveion Gross is a 58 y.o. male who presents for Chief Complaint  Patient presents with   Annual Exam    Patient Care Team: Christna Kulick, Alm RAMAN, PA-C as PCP - General (Family Medicine) Elmira Newman PARAS, MD as Consulting Physician (Cardiology) Elner Arley LABOR, MD as Consulting Physician (Ophthalmology) Gerome Maurilio HERO, PA-C as Physician Assistant (Physician Assistant), vascular Has dentures, doesn't see dentist Dr. Krystal Doing, vascular surgery Dr. Oneil Platts, ophthalmology [facility] dermatology   Concerns: Still smokes some, but using nicotine  replacement to try and stop.  Recently had CT chest and echocardiogram  No leg pain with activity, no chest pain or SOB.  Walking for exercise.   Reviewed their medical, surgical, family, social, medication, and allergy history and updated chart as appropriate.  Past Medical History:  Diagnosis Date   Aortic atherosclerosis (HCC)    Aortic valve insufficiency 12/15/2014   Moderate by ECHO 12/15/14 normal LV function    Back pain    Carotid artery occlusion    Cataract    Hyperlipidemia    Hypertension    Impaired fasting blood sugar    Left epiretinal membrane 09/12/2019   This condition is resolved after vitrectomy and membrane peel was performed, 02/06/2019.  Topographic distortion completely released.   Loss of balance    Peripheral vascular disease (HCC)    Smoker    Vitreous hemorrhage of left eye (HCC) 06/10/2019    Past Surgical History:  Procedure Laterality Date   CATARACT EXTRACTION     COLONOSCOPY  07/2020   never    Family History  Problem Relation Age of Onset   CVA Mother    Other Mother        brain tumor   Other Father        complications from prostate sugery   Cancer Father        prostate   Thyroid  disease Neg Hx    Heart disease Neg Hx    Diabetes Neg Hx    Kidney disease Neg Hx    Hypertension Neg Hx    Hyperlipidemia Neg Hx      Current Outpatient  Medications:    brimonidine  (ALPHAGAN ) 0.2 % ophthalmic solution, Place 1 drop into both eyes 3 (three) times daily., Disp: , Rfl:    clopidogrel  (PLAVIX ) 75 MG tablet, Take 75 mg by mouth daily., Disp: , Rfl:    dorzolamide-timolol  (COSOPT) 2-0.5 % ophthalmic solution, Place 1 drop into the left eye 2 (two) times daily., Disp: , Rfl:    ferrous gluconate  (FERGON) 324 MG tablet, Take 1 tablet (324 mg total) by mouth 2 (two) times daily with a meal., Disp: 180 tablet, Rfl: 1   latanoprost (XALATAN) 0.005 % ophthalmic solution, INSTILL 1 DROP IN LEFT EYE NIGHTLY, Disp: 2.5 mL, Rfl: 3   nicotine  (NICODERM CQ  - DOSED IN MG/24 HOURS) 14 mg/24hr patch, Place 1 patch (14 mg total) onto the skin daily., Disp: 28 patch, Rfl: 0   Omega-3 Fatty Acids (FISH OIL) 1200 MG CAPS, Take by mouth daily., Disp: , Rfl:    rosuvastatin  (CRESTOR ) 40 MG tablet, Take 1 tablet (40 mg total) by mouth daily., Disp: 90 tablet, Rfl: 3   tamsulosin  (FLOMAX ) 0.4 MG CAPS capsule, Take 1 capsule (0.4 mg total) by mouth daily after supper., Disp: 90 capsule, Rfl: 3   Varenicline  Tartrate, Starter, (CHANTIX  STARTING MONTH PAK) 0.5 MG X 11 & 1 MG X 42 TBPK, Take 1 tablet by mouth daily.,  Disp: 1 each, Rfl: 2   azithromycin  (ZITHROMAX ) 250 MG tablet, Take 1 tablet (250 mg total) by mouth daily. Take 1 every day until finished., Disp: 4 tablet, Rfl: 0   benzonatate  (TESSALON ) 100 MG capsule, Take 1 capsule (100 mg total) by mouth 3 (three) times daily as needed for cough., Disp: 12 capsule, Rfl: 0   methocarbamol  (ROBAXIN ) 500 MG tablet, Take 1 tablet (500 mg total) by mouth 2 (two) times daily. (Patient not taking: Reported on 09/04/2023), Disp: 20 tablet, Rfl: 0   naproxen  (NAPROSYN ) 500 MG tablet, Take 1 tablet (500 mg total) by mouth 2 (two) times daily. (Patient not taking: Reported on 09/04/2023), Disp: 30 tablet, Rfl: 0  No Known Allergies   Review of Systems  Constitutional:  Negative for chills, fever, malaise/fatigue and  weight loss.  HENT:  Negative for congestion, ear pain, hearing loss, sore throat and tinnitus.   Eyes:  Negative for blurred vision, pain and redness.  Respiratory:  Negative for cough, hemoptysis and shortness of breath.   Cardiovascular:  Negative for chest pain, palpitations, orthopnea, claudication and leg swelling.  Gastrointestinal:  Negative for abdominal pain, blood in stool, constipation, diarrhea, nausea and vomiting.  Genitourinary:  Negative for dysuria, flank pain, frequency, hematuria and urgency.  Musculoskeletal:  Negative for falls, joint pain and myalgias.  Skin:  Negative for itching and rash.  Neurological:  Negative for dizziness, tingling, speech change, weakness and headaches.  Endo/Heme/Allergies:  Negative for polydipsia. Does not bruise/bleed easily.  Psychiatric/Behavioral:  Negative for depression and memory loss. The patient is not nervous/anxious and does not have insomnia.         Objective:  BP 116/74 (BP Location: Left Arm, Patient Position: Sitting)   Pulse 84   Ht 5' 8 (1.727 m)   Wt 142 lb (64.4 kg)   SpO2 96%   BMI 21.59 kg/m    BP Readings from Last 3 Encounters:  12/27/23 116/74  12/08/23 108/64  12/07/23 134/60   Wt Readings from Last 3 Encounters:  12/27/23 142 lb (64.4 kg)  12/08/23 141 lb 12.8 oz (64.3 kg)  12/07/23 141 lb 9.6 oz (64.2 kg)    General appearance: alert, no distress, WD/WN, Caucasian male Skin: Majority of bilateral anterior lower legs with patchy discoloration including some hypopigmented areas as well as hemosiderin staining consistent with venous stasis disease, no other worrisome lesions HEENT: normocephalic, conjunctiva/corneas normal, sclerae anicteric, PERRLA, EOMi, nares patent, no discharge or erythema, pharynx normal Oral cavity: MMM, tongue normal, edentulous Neck: supple, no lymphadenopathy, no thyromegaly, no masses, normal ROM, left carotid bruit Chest: non tender, normal shape and expansion Heart: 2/6  holosystolic murmur in upper sternal borders,  RRR, normal S1, S2, no murmurs Lungs: CTA bilaterally, no wheezes, rhonchi, or rales Abdomen: +bs, soft, non tender, non distended, no masses, no hepatomegaly, no splenomegaly, no bruits Back: non tender, normal ROM, no scoliosis Musculoskeletal: upper extremities non tender, no obvious deformity, normal ROM throughout, lower extremities non tender, no obvious deformity, normal ROM throughout Extremities: no edema, no cyanosis, no clubbing Pulses: 2+ symmetric, upper  extremities, normal cap refill, decreased pulses in extremities chronically consistent with vascular disease Neurological: alert, oriented x 3, CN2-12 intact, strength normal upper extremities and lower extremities, sensation normal throughout, DTRs 2+ throughout, no cerebellar signs, gait normal Psychiatric: normal affect, behavior normal, pleasant  GU: declined Rectal: declined   Assessment and Plan :   Encounter Diagnoses  Name Primary?   Annual physical exam Yes  Iron  deficiency anemia, unspecified iron  deficiency anemia type    Aortic atherosclerosis (HCC)    Varicose veins of both lower extremities, unspecified whether complicated    Screening for prostate cancer    Tobacco abuse    Mixed hyperlipidemia    Moderate aortic regurgitation    Impaired fasting blood sugar    Benign prostatic hyperplasia, unspecified whether lower urinary tract symptoms present    Thyroid  nodule    Dryness of both ear canals      Today you had a preventative care visit or wellness visit.    Topics today may have included healthy lifestyle, diet, exercise, preventative care, vaccinations, sick and well care, proper use of emergency dept and after hours care, as well as other concerns.     Recommendations: Continue to return yearly for your annual wellness and preventative care visits.  This gives us  a chance to discuss healthy lifestyle, exercise, vaccinations, review your chart record,  and perform screenings where appropriate.  I recommend you see your eye doctor yearly for routine vision care.  I recommend you see your dentist yearly for routine dental care including hygiene visits twice yearly.   Vaccination recommendations were reviewed Immunization History  Administered Date(s) Administered   Influenza,inj,Quad PF,6+ Mos 12/30/2014, 05/12/2022   PFIZER(Purple Top)SARS-COV-2 Vaccination 07/30/2019, 08/20/2019, 04/11/2020   PNEUMOCOCCAL CONJUGATE-20 09/22/2021   Pneumococcal Polysaccharide-23 12/30/2014   Td 07/22/2020    Vaccine recommendations: Recommend yearly flu shot in the fall Consider covid booster when the next ones come out   Screening for cancer: Colon cancer screening:  Negative Cologuard 2022.  Given anemia, referral again to GI. We referred to gastroenterology in 08/2023, but somehow hasn't been scheduled.  We discussed PSA, prostate exam, and prostate cancer screening risks/benefits.     Skin cancer screening: Check your skin regularly for new changes, growing lesions, or other lesions of concern Come in for evaluation if you have skin lesions of concern.  Lung cancer screening: Yearly CT chest lung cancer screen.  Reviewed 11/2023 scan.  We currently don't have screenings for other cancers besides breast, cervical, colon, and lung cancers.  If you have a strong family history of cancer or have other cancer screening concerns, please let me know.    Bone health: Get at least 150 minutes of aerobic exercise weekly Get weight bearing exercise at least once weekly   Heart health: Get at least 150 minutes of aerobic exercise weekly Limit alcohol It is important to maintain a healthy blood pressure and healthy cholesterol numbers  Echocardiogram 12/2023:  moderate narrowing and leakiness of aortic valve, otherwise stable, per cardiology notes.   Separate significant issues discussed: BPH-continue Flomax   Iron  deficiency - updated labs,  continue iron  oral and recent order for  infusions.   Referral to GI for eval.  Aortic atherosclerosis, CAD, hyperlipidemia-continue statin Crestor  40mg  daily  Peripheral vascular disease-continue Plavix  and statin.  Consider updated ABIs  Venous stasis disease-continue routine walking for exercise.    Smoker - continue efforts to quit smoking.   Ear itching and dryness - use daily moisturizing lotion and use over the counter hydrocortisone cream every few days as needed for dryness and itching  Thyroid  nodule - follow up with nuclear scan soon per endocrinology   Huriel was seen today for annual exam.  Diagnoses and all orders for this visit:  Annual physical exam -     PSA -     Ambulatory referral to Gastroenterology -     Iron ,  TIBC and Ferritin Panel -     CBC with Differential/Platelet -     Lipid panel -     Basic metabolic panel with GFR -     Hemoglobin A1c  Iron  deficiency anemia, unspecified iron  deficiency anemia type -     Ambulatory referral to Gastroenterology -     Iron , TIBC and Ferritin Panel -     CBC with Differential/Platelet  Aortic atherosclerosis (HCC) -     Lipid panel  Varicose veins of both lower extremities, unspecified whether complicated  Screening for prostate cancer -     PSA  Tobacco abuse  Mixed hyperlipidemia -     Lipid panel  Moderate aortic regurgitation  Impaired fasting blood sugar -     Hemoglobin A1c  Benign prostatic hyperplasia, unspecified whether lower urinary tract symptoms present  Thyroid  nodule  Dryness of both ear canals     Follow-up pending labs, yearly for physical

## 2023-12-27 NOTE — Patient Instructions (Addendum)
  Today you had a preventative care visit or wellness visit.    Topics today may have included healthy lifestyle, diet, exercise, preventative care, vaccinations, sick and well care, proper use of emergency dept and after hours care, as well as other concerns.     Recommendations: Continue to return yearly for your annual wellness and preventative care visits.  This gives us  a chance to discuss healthy lifestyle, exercise, vaccinations, review your chart record, and perform screenings where appropriate.  I recommend you see your eye doctor yearly for routine vision care.  I recommend you see your dentist yearly for routine dental care including hygiene visits twice yearly.   Vaccination recommendations were reviewed Immunization History  Administered Date(s) Administered   Influenza,inj,Quad PF,6+ Mos 12/30/2014, 05/12/2022   PFIZER(Purple Top)SARS-COV-2 Vaccination 07/30/2019, 08/20/2019, 04/11/2020   PNEUMOCOCCAL CONJUGATE-20 09/22/2021   Pneumococcal Polysaccharide-23 12/30/2014   Td 07/22/2020    Vaccine recommendations: Recommend yearly flu shot in the fall Consider covid booster when the next ones come out   Screening for cancer: Colon cancer screening:  Negative Cologuard 2022.  Given anemia, referral again to GI. We referred to gastroenterology in 08/2023, but somehow hasn't been scheduled.  We discussed PSA, prostate exam, and prostate cancer screening risks/benefits.     Skin cancer screening: Check your skin regularly for new changes, growing lesions, or other lesions of concern Come in for evaluation if you have skin lesions of concern.  Lung cancer screening: Yearly CT chest lung cancer screen.  Reviewed 11/2023 scan.  We currently don't have screenings for other cancers besides breast, cervical, colon, and lung cancers.  If you have a strong family history of cancer or have other cancer screening concerns, please let me know.    Bone health: Get at least 150  minutes of aerobic exercise weekly Get weight bearing exercise at least once weekly   Heart health: Get at least 150 minutes of aerobic exercise weekly Limit alcohol It is important to maintain a healthy blood pressure and healthy cholesterol numbers  Echocardiogram 12/2023:  moderate narrowing and leakiness of aortic valve, otherwise stable, per cardiology notes.   Separate significant issues discussed: BPH-continue Flomax   Iron  deficiency - updated labs, continue iron  oral and infusions.   Referral to GI for eval.  Aortic atherosclerosis, CAD, hyperlipidemia-continue statin Crestor  40mg  daily  Peripheral vascular disease-continue Plavix  and statin.  Consider updated ABIs  Venous stasis disease-continue routine walking for exercise.    Smoker - continue efforts to quit smoking  Ear itching and dryness - use daily moisturizing lotion and use over the counter hydrocortisone cream every few days as needed for dryness and itching  Thyroid  nodule - follow up with nuclear scan soon per endocrionlogy

## 2023-12-28 ENCOUNTER — Other Ambulatory Visit: Payer: Self-pay | Admitting: Medical

## 2023-12-28 ENCOUNTER — Ambulatory Visit: Payer: Self-pay | Admitting: Medical

## 2023-12-28 LAB — BASIC METABOLIC PANEL WITH GFR
BUN/Creatinine Ratio: 13 (ref 10–24)
BUN: 8 mg/dL (ref 8–27)
CO2: 21 mmol/L (ref 20–29)
Calcium: 8.9 mg/dL (ref 8.6–10.2)
Chloride: 105 mmol/L (ref 96–106)
Creatinine, Ser: 0.63 mg/dL — AB (ref 0.76–1.27)
Glucose: 98 mg/dL (ref 70–99)
Potassium: 4.3 mmol/L (ref 3.5–5.2)
Sodium: 140 mmol/L (ref 134–144)
eGFR: 109 mL/min/1.73 (ref >59)

## 2023-12-28 LAB — IRON,TIBC AND FERRITIN PANEL
Ferritin: 396 ng/mL (ref 30–400)
Iron Saturation: 33 (ref 15–55)
Iron: 77 ug/dL (ref 38–169)
Total Iron Binding Capacity: 233 ug/dL — AB (ref 250–450)
UIBC: 156 ug/dL (ref 111–343)

## 2023-12-28 LAB — LIPID PANEL
Cholesterol, Total: 108 mg/dL (ref 100–199)
HDL: 45 mg/dL (ref >39)
LDL CALC COMMENT:: 2.4 ratio (ref 0.0–5.0)
LDL Chol Calc (NIH): 51 mg/dL (ref 0–99)
Triglycerides: 49 mg/dL (ref 0–149)
VLDL Cholesterol Cal: 12 mg/dL (ref 5–40)

## 2023-12-28 LAB — CBC WITH DIFFERENTIAL/PLATELET
Basophils Absolute: 0 x10E3/uL (ref 0.0–0.2)
Basos: 0 %
EOS (ABSOLUTE): 0.1 x10E3/uL (ref 0.0–0.4)
Eos: 1 %
Hematocrit: 45.4 % (ref 37.5–51.0)
Hemoglobin: 14.7 g/dL (ref 13.0–17.7)
Immature Grans (Abs): 0 x10E3/uL (ref 0.0–0.1)
Immature Granulocytes: 0 %
Lymphocytes Absolute: 2.1 x10E3/uL (ref 0.7–3.1)
Lymphs: 34 %
MCH: 31.2 pg (ref 26.6–33.0)
MCHC: 32.4 g/dL (ref 31.5–35.7)
MCV: 96 fL (ref 79–97)
Monocytes Absolute: 0.5 x10E3/uL (ref 0.1–0.9)
Monocytes: 8 %
Neutrophils Absolute: 3.6 x10E3/uL (ref 1.4–7.0)
Neutrophils: 57 %
Platelets: 164 x10E3/uL (ref 150–450)
RBC: 4.71 x10E6/uL (ref 4.14–5.80)
RDW: 16 % — ABNORMAL HIGH (ref 11.6–15.4)
WBC: 6.3 x10E3/uL (ref 3.4–10.8)

## 2023-12-28 LAB — PSA: Prostate Specific Ag, Serum: 0.4 ng/mL (ref 0.0–4.0)

## 2023-12-28 MED ORDER — FERROUS GLUCONATE 324 (38 FE) MG PO TABS: 324.0000 mg | ORAL_TABLET | Freq: Two times a day (BID) | ORAL | 0 refills | Status: AC

## 2023-12-28 MED ORDER — CLOPIDOGREL BISULFATE 75 MG PO TABS: 75.0000 mg | ORAL_TABLET | Freq: Every day | ORAL | 2 refills | Status: AC

## 2023-12-28 NOTE — Progress Notes (Signed)
 Labs look pretty good.  Iron  levels are much improved, blood counts okay, kidney and electrolytes okay, diabetes marker stable but at risk for diabetes.  Prostate marker normal.  Cholesterol numbers look good.  Continue plan to see gastroenterology.  Referral has been placed.  Continue current medications  I recommend screening for blood flow in the legs.  If agreeable I will put in a referral for this screening

## 2023-12-28 NOTE — Progress Notes (Signed)
 Pt saw MyChart results.

## 2023-12-28 NOTE — Telephone Encounter (Signed)
 Copied from CRM #[id number]. Topic: Clinical - Medication Question >> Dec 28, 2023  2:07 PM Graeme ORN wrote: Reason for CRM: Patient called. States he received his results. He has a questions about iro. Would like to leave message for [facility]. Would like tot know if he should still take it. Let him know per note - continue current medications and ferrous gluconate  (FERGON) 324 MG tablet sent to pharmacy today but patient would like to confirm with provider. Thank You

## 2023-12-28 NOTE — Progress Notes (Signed)
 LVM telling pt to call office or check MyChart for his results.

## 2023-12-29 NOTE — Progress Notes (Signed)
 Find out if he have any more iron  infusions left?  He was getting iron  infusions recently, but I am not sure if he has any more planned?  Iron  takes a while to build up in your system with oral iron  and through diet.  So if he does not have any more infusions left, it may be worth having him at least take iron  3 days/week until he sees gastroenterology and has evaluation soon.  So he can maybe hold off taking every single day but just 3 days a week.  I would recommend he cancel any additional injections if he is scheduled for these

## 2024-01-03 ENCOUNTER — Encounter (HOSPITAL_COMMUNITY): Payer: Self-pay

## 2024-01-03 ENCOUNTER — Ambulatory Visit (HOSPITAL_COMMUNITY)

## 2024-01-03 ENCOUNTER — Encounter (HOSPITAL_COMMUNITY)

## 2024-01-04 ENCOUNTER — Encounter (HOSPITAL_COMMUNITY)

## 2024-01-31 DIAGNOSIS — H348121 Central retinal vein occlusion, left eye, with retinal neovascularization: Secondary | ICD-10-CM | POA: Diagnosis not present

## 2024-02-06 ENCOUNTER — Ambulatory Visit: Attending: Cardiology | Admitting: Cardiology

## 2024-02-06 ENCOUNTER — Encounter: Payer: Self-pay | Admitting: Cardiology

## 2024-02-06 VITALS — BP 105/62 | HR 65 | Ht 68.0 in | Wt 143.0 lb

## 2024-02-06 DIAGNOSIS — I350 Nonrheumatic aortic (valve) stenosis: Secondary | ICD-10-CM | POA: Diagnosis not present

## 2024-02-06 DIAGNOSIS — I351 Nonrheumatic aortic (valve) insufficiency: Secondary | ICD-10-CM | POA: Diagnosis not present

## 2024-02-06 NOTE — Patient Instructions (Signed)
   Testing/Procedures: Echocardiogram in 6 months   Your physician has requested that you have an echocardiogram. Echocardiography is a painless test that uses sound waves to create images of your heart. It provides your doctor with information about the size and shape of your heart and how well your heart's chambers and valves are working. This procedure takes approximately one hour. There are no restrictions for this procedure. Please do NOT wear cologne, perfume, aftershave, or lotions (deodorant is allowed). Please arrive 15 minutes prior to your appointment time.  Please note: We ask at that you not bring children with you during ultrasound (echo/ vascular) testing. Due to room size and safety concerns, children are not allowed in the ultrasound rooms during exams. Our front office staff cannot provide observation of children in our lobby area while testing is being conducted. An adult accompanying a patient to their appointment will only be allowed in the ultrasound room at the discretion of the ultrasound technician under special circumstances. We apologize for any inconvenience.  Keep carotid ultrasound as scheduled   Follow-Up: At [facility], you and your health needs are our priority.  As part of our continuing mission to provide you with exceptional heart care, our providers are all part of one team.  This team includes your primary Cardiologist (physician) and Advanced Practice Providers or APPs (Physician Assistants and Nurse Practitioners) who all work together to provide you with the care you need, when you need it.  Your next appointment:   6 month(s) (echo before)  Provider:   Newman JINNY Lawrence, MD

## 2024-02-06 NOTE — Progress Notes (Signed)
 Cardiology Office Note:  .   Date:  02/06/2024  ID:  Terry Gross, DOB 18-Dec-1965, MRN [id number] PCP: Bulah Alm RAMAN, PA-C  [facility] Providers Cardiologist:  Newman Lawrence, MD PCP: Bulah Alm RAMAN, PA-C  Chief Complaint  Patient presents with   Aortic Stenosis     Terry Gross is a 58 y.o. male with hypertension, hyperlipidemia, mod AS, mod AI, Lt IC occlusion, retinal artery occlusion  Discussed the use of AI scribe software for clinical note transcription with the patient, who gave verbal consent to proceed.  History of Present Illness  Patient is doing well, denies any complaints of chest pain or shortness of breath.  Has not been doing heavy physical exertion recently reviewed recent echocardiogram results with the patient, details below.     Vitals:   02/06/24 1108  BP: 105/62  Pulse: 65  SpO2: 97%      Review of Systems  Cardiovascular:  Negative for chest pain, dyspnea on exertion, leg swelling, palpitations and syncope.        Studies Reviewed: SABRA        EKG 02/06/2024: Normal sinus rhythm Normal ECG When compared with ECG of 17-Sep-2023 01:17, No significant change was found    Carotid US  10/17/2022: Summary:  Right Carotid: Velocities in the right ICA are consistent with a 40-59% stenosis.  Left Carotid: Evidence consistent with a total occlusion of the left ICA.   Vertebrals:  Bilateral vertebral arteries demonstrate antegrade flow.  Subclavians: Normal flow hemodynamics were seen in bilateral subclavian arteries.    Echocardiogram 12/2023:   1. Left ventricular ejection fraction, by estimation, is 60 to 65%. Left  ventricular ejection fraction by 3D volume is 63 %. The left ventricle has  normal function. The left ventricle has no regional wall motion  abnormalities. Left ventricular diastolic  parameters are consistent with Grade I diastolic dysfunction (impaired relaxation). The average left ventricular global  longitudinal strain is -22.2 %. The global longitudinal strain is normal.   2. Right ventricular systolic function is normal. The right ventricular  size is normal. There is normal pulmonary artery systolic pressure. The  estimated right ventricular systolic pressure is 28.4 mmHg.   3. Left atrial size was mildly dilated.   4. The mitral valve is normal in structure. Trivial mitral valve  regurgitation. No evidence of mitral stenosis.   5. The aortic valve is tricuspid. Aortic valve regurgitation is moderate. Moderate aortic valve stenosis. Aortic valve area, by VTI measures 1.40 cm. Aortic valve mean gradient measures 37.0 mmHg.   6. The inferior vena cava is normal in size with greater than 50%  respiratory variability, suggesting right atrial pressure of 3 mmHg.   Echocardiogram 01/2023:  Normal LV systolic function with EF 53%. Normal left ventricular wall  thickness. Normal global wall motion. Left ventricle cavity is normal in  size. Doppler evidence of grade I (impaired) diastolic dysfunction, normal  LAP.  Left atrial cavity is moderately dilated.  Right atrial cavity is mildly dilated.  Trileaflet aortic valve with moderate calcification. Moderate aortic  stenosis. Moderate to severe aortic regurgitation. Vmax 3.3 m/sec, mean PG 23 mmHg, AVA 1.4 cm by continuity equation.  Mild (Grade I) mitral regurgitation.  Mild tricuspid regurgitation. Estimated pulmonary artery systolic pressure  25 mmHg.  IVC is dilated with a respiratory response of <50%.  Estimated right atrial pressure 15 mmHg.  Previous study on 10/29/2020 reported mild aortic stenosis with peak  velocity 2.58m/s, peak gradient , mean gradient 16.18mmHg,  AVA 1.9cm.   Vascular US  04/09/2019:  Right Carotid: Velocities in the right ICA are consistent with a 40-59% stenosis. Left Carotid: Evidence consistent with a total occlusion of the left ICA. Vertebrals:  Bilateral vertebral arteries demonstrate antegrade flow.   Subclavians: Right subclavian artery flow was turbulent. Normal flow hemodynamics were seen in the left subclavian artery.  Labs 12/2023: Chol 108, TG 49, HDL 45, LDL 51 HbA1C 5.7% Hb 14.7 Cr 0.63 TSH 0.20   Physical Exam Vitals and nursing note reviewed.  Constitutional:      General: He is not in acute distress. Neck:     Vascular: No JVD.  Cardiovascular:     Rate and Rhythm: Normal rate and regular rhythm.     Heart sounds: Murmur heard.     Harsh midsystolic murmur is present with a grade of 3/6 at the upper right sternal border radiating to the neck.     High-pitched blowing decrescendo early diastolic murmur is present with a grade of 2/4 at the upper right sternal border radiating to the apex.  Pulmonary:     Effort: Pulmonary effort is normal.     Breath sounds: Normal breath sounds. No wheezing or rales.  Musculoskeletal:     Right lower leg: No edema.     Left lower leg: No edema.      VISIT DIAGNOSES:   ICD-10-CM   1. Moderate aortic stenosis  I35.0 EKG 12-Lead    ECHOCARDIOGRAM COMPLETE    2. Moderate aortic regurgitation  I35.1 EKG 12-Lead    3. Nonrheumatic aortic valve insufficiency  I35.1 ECHOCARDIOGRAM COMPLETE       Terry Gross is a 58 y.o. male with hypertension, hyperlipidemia, mod AS, mod AI, Lt IC occlusion, retinal artery occlusion Assessment & Plan  Aortic stenosis, aortic regurgitation: Increased gradients since last echocardiogram a year ago, still remains in the moderate range. Stable, clinically asymptomatic. Repeat echocardiogram in 1 year.    Patient asked if there is any way to reduce portion of aortic stenosis and/or regurgitation.  There are ongoing clinical trials to reduce severity of aortic stenosis, but not aortic regurgitation.  I will get him in touch with clinical trial group in our office for trial Katalyst AV.   Hyperlipidemia: Well controlled on rosuvastatin  40 mg daily.    PAD: Lt IC occlusion, Rt IC  moderate stenosis. Continue Plavix , statin.   Continue f/u /vascular surgery     F/u in 6 months  Signed, Newman JINNY Lawrence, MD

## 2024-02-07 ENCOUNTER — Other Ambulatory Visit: Payer: Self-pay | Admitting: Surgery

## 2024-02-07 DIAGNOSIS — I6523 Occlusion and stenosis of bilateral carotid arteries: Secondary | ICD-10-CM

## 2024-02-12 ENCOUNTER — Encounter (HOSPITAL_COMMUNITY)

## 2024-02-13 ENCOUNTER — Ambulatory Visit (HOSPITAL_COMMUNITY)

## 2024-02-20 ENCOUNTER — Encounter (HOSPITAL_COMMUNITY)
Admission: RE | Admit: 2024-02-20 | Discharge: 2024-02-20 | Disposition: A | Discharge status: Home / Self Care | Source: Ambulatory Visit | Attending: Endocrinology | Admitting: Endocrinology

## 2024-02-20 DIAGNOSIS — E041 Nontoxic single thyroid nodule: Secondary | ICD-10-CM | POA: Insufficient documentation

## 2024-02-20 DIAGNOSIS — E0590 Thyrotoxicosis, unspecified without thyrotoxic crisis or storm: Secondary | ICD-10-CM | POA: Insufficient documentation

## 2024-02-21 ENCOUNTER — Encounter (HOSPITAL_COMMUNITY)
Admission: RE | Admit: 2024-02-21 | Discharge: 2024-02-21 | Disposition: A | Discharge status: Home / Self Care | Source: Ambulatory Visit | Attending: Endocrinology | Admitting: Endocrinology

## 2024-03-07 ENCOUNTER — Encounter: Payer: Self-pay | Admitting: Medical

## 2024-03-11 ENCOUNTER — Ambulatory Visit (HOSPITAL_COMMUNITY)
Admission: RE | Admit: 2024-03-11 | Discharge: 2024-03-11 | Disposition: A | Discharge status: Home / Self Care | Source: Ambulatory Visit | Attending: Surgery | Admitting: Surgery

## 2024-03-11 ENCOUNTER — Ambulatory Visit: Attending: Surgery | Admitting: Physician Assistant

## 2024-03-11 VITALS — BP 115/72 | HR 62 | Temp 98.0°F | Wt 144.0 lb

## 2024-03-11 DIAGNOSIS — I6523 Occlusion and stenosis of bilateral carotid arteries: Secondary | ICD-10-CM | POA: Diagnosis not present

## 2024-03-11 DIAGNOSIS — I6522 Occlusion and stenosis of left carotid artery: Secondary | ICD-10-CM | POA: Diagnosis not present

## 2024-03-11 DIAGNOSIS — I6521 Occlusion and stenosis of right carotid artery: Secondary | ICD-10-CM | POA: Diagnosis not present

## 2024-03-11 NOTE — Progress Notes (Signed)
 Office Note     CC:  follow up Requesting Provider:  Bulah Alm RAMAN, PA-C  HPI: Terry Gross is a 58 y.o. (1965/05/31) male who presents for routine follow up of carotid artery stenosis. He has known left ICA occlusion. We have been following his right ICA stenosis of 60-79%. This was identified on initial ultrasound in 2020.More recent duplex evaluation has shown the right ICA to be 40-59%.   He denies any amaurosis fugax or other visual changes, slurred speech, facial drooping, unilateral upper or lower extremity weakness or numbness. He says he walks regularly He does continue to smoke but he says he only smoke about 4-5 cigarettes per day.  He is medically managed on Plavix  and Statin.   Past Medical History:  Diagnosis Date   Aortic atherosclerosis    Aortic valve insufficiency 12/15/2014   Moderate by ECHO 12/15/14 normal LV function    Back pain    Carotid artery occlusion    Cataract    Hyperlipidemia    Hypertension    Impaired fasting blood sugar    Left epiretinal membrane 09/12/2019   This condition is resolved after vitrectomy and membrane peel was performed, 02/06/2019.  Topographic distortion completely released.   Loss of balance    Peripheral vascular disease    Smoker    Vitreous hemorrhage of left eye (HCC) 06/10/2019    Past Surgical History:  Procedure Laterality Date   CATARACT EXTRACTION     COLONOSCOPY  07/2020   never    Social History   Socioeconomic History   Marital status: Married    Spouse name: Not on file   Number of children: 0   Years of education: Not on file   Highest education level: Not on file  Occupational History   Not on file  Tobacco Use   Smoking status: Every Day    Current packs/day: 0.25    Average packs/day: 0.3 packs/day for 32.0 years (8.0 ttl pk-yrs)    Types: Cigarettes    Passive exposure: Never   Smokeless tobacco: Never   Tobacco comments:    4-5 cig a day  Vaping Use   Vaping status: Never Used   Substance and Sexual Activity   Alcohol use: No   Drug use: No   Sexual activity: Not on file  Other Topics Concern   Not on file  Social History Narrative   Married, no children.  Exercise some with calisthenics, walking.    From [place] originally.   11/2022   Social Drivers of Corporate Investment Banker Strain: Not on file  Food Insecurity: Not on file  Transportation Needs: Not on file  Physical Activity: Not on file  Stress: Not on file  Social Connections: Not on file  Intimate Partner Violence: Not on file    Family History  Problem Relation Age of Onset   CVA Mother    Other Mother        brain tumor   Other Father        complications from prostate sugery   Cancer Father        prostate   Thyroid  disease Neg Hx    Heart disease Neg Hx    Diabetes Neg Hx    Kidney disease Neg Hx    Hypertension Neg Hx    Hyperlipidemia Neg Hx     Current Outpatient Medications  Medication Sig Dispense Refill   brimonidine  (ALPHAGAN ) 0.2 % ophthalmic solution Place 1 drop into both eyes  3 (three) times daily.     clopidogrel  (PLAVIX ) 75 MG tablet Take 1 tablet (75 mg total) by mouth daily. 90 tablet 2   dorzolamide-timolol  (COSOPT) 2-0.5 % ophthalmic solution Place 1 drop into the left eye 2 (two) times daily.     ferrous gluconate  (FERGON) 324 MG tablet Take 1 tablet (324 mg total) by mouth 2 (two) times daily with a meal. 180 tablet 0   latanoprost (XALATAN) 0.005 % ophthalmic solution INSTILL 1 DROP IN LEFT EYE NIGHTLY 2.5 mL 3   nicotine  (NICODERM CQ  - DOSED IN MG/24 HOURS) 14 mg/24hr patch Place 1 patch (14 mg total) onto the skin daily. 28 patch 0   Omega-3 Fatty Acids (FISH OIL) 1200 MG CAPS Take by mouth daily.     rosuvastatin  (CRESTOR ) 40 MG tablet Take 1 tablet (40 mg total) by mouth daily. 90 tablet 3   tamsulosin  (FLOMAX ) 0.4 MG CAPS capsule Take 1 capsule (0.4 mg total) by mouth daily after supper. 90 capsule 3   Varenicline  Tartrate, Starter, (CHANTIX  STARTING  MONTH PAK) 0.5 MG X 11 & 1 MG X 42 TBPK Take 1 tablet by mouth daily. 1 each 2   No current facility-administered medications for this visit.    No Known Allergies   REVIEW OF SYSTEMS:  [X]  denotes positive finding, [ ]  denotes negative finding Cardiac  Comments:  Chest pain or chest pressure:    Shortness of breath upon exertion:    Short of breath when lying flat:    Irregular heart rhythm:        Vascular    Pain in calf, thigh, or hip brought on by ambulation:    Pain in feet at night that wakes you up from your sleep:     Blood clot in your veins:    Leg swelling:         Pulmonary    Oxygen at home:    Productive cough:     Wheezing:         Neurologic    Sudden weakness in arms or legs:     Sudden numbness in arms or legs:     Sudden onset of difficulty speaking or slurred speech:    Temporary loss of vision in one eye:     Problems with dizziness:         Gastrointestinal    Blood in stool:     Vomited blood:         Genitourinary    Burning when urinating:     Blood in urine:        Psychiatric    Major depression:         Hematologic    Bleeding problems:    Problems with blood clotting too easily:        Skin    Rashes or ulcers:        Constitutional    Fever or chills:      PHYSICAL EXAMINATION:  Vitals:   03/11/24 0846 03/11/24 0849  BP: 109/66 115/72  Pulse: 63 62  Temp: 98 F (36.7 C)   TempSrc: Temporal   Weight: 144 lb (65.3 kg)     General:  WDWN in NAD; vital signs documented above Gait: Normal HENT: WNL, normocephalic Pulmonary: normal non-labored breathing Cardiac: regular HR, without  Murmurs with right carotid bruit Abdomen: soft Vascular Exam/Pulses: 2+ radial pulses, 2+ Dp pulses bilaterally Extremities: without ischemic changes, without Gangrene , without cellulitis; without open wounds;  Musculoskeletal:  no muscle wasting or atrophy  Neurologic: A&O X 3 Psychiatric:  The pt has Normal affect.   Non-Invasive  Vascular Imaging:   VAS US  Carotid Duplex: Summary:  Right Carotid: Velocities in the right ICA are consistent with a 40-59% stenosis.   Left Carotid: Evidence consistent with a total occlusion of the left ICA.   Vertebrals:  Bilateral vertebral arteries demonstrate antegrade flow.  Subclavians: Normal flow hemodynamics were seen in bilateral subclavian arteries.    ASSESSMENT/PLAN:: 59 y.o. male here for follow up for carotid artery stenosis. He has known left ICA occlusion. The right ICA with 40-59% stenosis. He is without any TIA or stroke like symptoms.  - continue Plavix  and statin  - Encourage smoking cessation  - reviewed signs and symptoms of TIA/ Stroke and he understands should this occur to seek immediate medical attention - He will follow up in 1 year with repeat carotid duplex   Teretha Damme, PA-C [facility] [phone]  Clinic MD:   [place]

## 2024-03-19 LAB — LAB REPORT - SCANNED
Free T4: 1.6
TSH: 0.31 — AB (ref 0.41–5.90)

## 2024-04-17 ENCOUNTER — Other Ambulatory Visit: Payer: Self-pay

## 2024-04-17 ENCOUNTER — Emergency Department (HOSPITAL_COMMUNITY)

## 2024-04-17 ENCOUNTER — Observation Stay (HOSPITAL_COMMUNITY)
Admission: EM | Admit: 2024-04-17 | Discharge: 2024-04-19 | Disposition: A | Discharge status: Home / Self Care | Attending: Emergency Medicine | Admitting: Emergency Medicine

## 2024-04-17 DIAGNOSIS — H409 Unspecified glaucoma: Secondary | ICD-10-CM | POA: Insufficient documentation

## 2024-04-17 DIAGNOSIS — H34812 Central retinal vein occlusion, left eye, with macular edema: Secondary | ICD-10-CM | POA: Diagnosis present

## 2024-04-17 DIAGNOSIS — H34239 Retinal artery branch occlusion, unspecified eye: Secondary | ICD-10-CM | POA: Insufficient documentation

## 2024-04-17 DIAGNOSIS — N400 Enlarged prostate without lower urinary tract symptoms: Secondary | ICD-10-CM | POA: Diagnosis present

## 2024-04-17 DIAGNOSIS — E0510 Thyrotoxicosis with toxic single thyroid nodule without thyrotoxic crisis or storm: Secondary | ICD-10-CM | POA: Insufficient documentation

## 2024-04-17 DIAGNOSIS — E782 Mixed hyperlipidemia: Secondary | ICD-10-CM | POA: Diagnosis present

## 2024-04-17 DIAGNOSIS — I350 Nonrheumatic aortic (valve) stenosis: Secondary | ICD-10-CM | POA: Insufficient documentation

## 2024-04-17 DIAGNOSIS — Z720 Tobacco use: Secondary | ICD-10-CM | POA: Diagnosis present

## 2024-04-17 NOTE — ED Triage Notes (Signed)
 Patient states that he started coughing up blood 15-20 minutes. He denies being sick or having a cough recently.

## 2024-04-17 NOTE — ED Triage Notes (Signed)
 Pt reports when he went to bed tonight he laid down and got light headed. When he sat up he started coughing up blood with small clots, and continues to have the urge constantly to cough. Pt also reports getting dizzy when he stands up.

## 2024-04-18 ENCOUNTER — Observation Stay (HOSPITAL_COMMUNITY)

## 2024-04-18 ENCOUNTER — Encounter (HOSPITAL_COMMUNITY): Payer: Self-pay | Admitting: Internal Medicine

## 2024-04-18 ENCOUNTER — Other Ambulatory Visit: Payer: Self-pay | Admitting: Student

## 2024-04-18 DIAGNOSIS — N400 Enlarged prostate without lower urinary tract symptoms: Secondary | ICD-10-CM | POA: Diagnosis not present

## 2024-04-18 DIAGNOSIS — H349 Unspecified retinal vascular occlusion: Secondary | ICD-10-CM

## 2024-04-18 DIAGNOSIS — I352 Nonrheumatic aortic (valve) stenosis with insufficiency: Secondary | ICD-10-CM | POA: Diagnosis not present

## 2024-04-18 DIAGNOSIS — J449 Chronic obstructive pulmonary disease, unspecified: Secondary | ICD-10-CM

## 2024-04-18 DIAGNOSIS — R042 Hemoptysis: Secondary | ICD-10-CM | POA: Diagnosis not present

## 2024-04-18 DIAGNOSIS — E782 Mixed hyperlipidemia: Secondary | ICD-10-CM | POA: Diagnosis not present

## 2024-04-18 DIAGNOSIS — E0520 Thyrotoxicosis with toxic multinodular goiter without thyrotoxic crisis or storm: Secondary | ICD-10-CM

## 2024-04-18 DIAGNOSIS — Q2572 Congenital pulmonary arteriovenous malformation: Secondary | ICD-10-CM

## 2024-04-18 DIAGNOSIS — H409 Unspecified glaucoma: Secondary | ICD-10-CM

## 2024-04-18 DIAGNOSIS — D509 Iron deficiency anemia, unspecified: Secondary | ICD-10-CM | POA: Diagnosis not present

## 2024-04-18 DIAGNOSIS — I6522 Occlusion and stenosis of left carotid artery: Secondary | ICD-10-CM | POA: Diagnosis not present

## 2024-04-18 DIAGNOSIS — I739 Peripheral vascular disease, unspecified: Secondary | ICD-10-CM | POA: Diagnosis not present

## 2024-04-18 DIAGNOSIS — F17210 Nicotine dependence, cigarettes, uncomplicated: Secondary | ICD-10-CM | POA: Diagnosis not present

## 2024-04-18 LAB — CBC
HCT: 41.7 % (ref 39.0–52.0)
Hemoglobin: 13.7 g/dL (ref 13.0–17.0)
MCH: 32.9 pg (ref 26.0–34.0)
MCHC: 32.9 g/dL (ref 30.0–36.0)
MCV: 100 fL (ref 80.0–100.0)
Platelets: 206 K/uL (ref 150–400)
RBC: 4.17 MIL/uL — ABNORMAL LOW (ref 4.22–5.81)
RDW: 12.9 % (ref 11.5–15.5)
WBC: 7.6 K/uL (ref 4.0–10.5)
nRBC: 0 % (ref 0.0–0.2)

## 2024-04-18 LAB — COMPREHENSIVE METABOLIC PANEL WITH GFR
ALT: 16 U/L (ref 0–44)
AST: 17 U/L (ref 15–41)
Albumin: 3.6 g/dL (ref 3.5–5.0)
Alkaline Phosphatase: 58 U/L (ref 38–126)
Anion gap: 8 (ref 5–15)
BUN: 9 mg/dL (ref 6–20)
CO2: 25 mmol/L (ref 22–32)
Calcium: 8.7 mg/dL — ABNORMAL LOW (ref 8.9–10.3)
Chloride: 105 mmol/L (ref 98–111)
Creatinine, Ser: 0.54 mg/dL — ABNORMAL LOW (ref 0.61–1.24)
GFR, Estimated: >60 mL/min (ref >60)
Glucose, Bld: 108 mg/dL — ABNORMAL HIGH (ref 70–99)
Potassium: 3.9 mmol/L (ref 3.5–5.1)
Sodium: 138 mmol/L (ref 135–145)
Total Bilirubin: 0.6 mg/dL (ref 0.0–1.2)
Total Protein: 7 g/dL (ref 6.5–8.1)

## 2024-04-18 LAB — HIV ANTIBODY (ROUTINE TESTING W REFLEX): HIV Screen 4th Generation wRfx: NONREACTIVE

## 2024-04-18 LAB — TYPE AND SCREEN
ABO/RH(D): O POS
Antibody Screen: NEGATIVE

## 2024-04-18 MED ORDER — MELATONIN 3 MG PO TABS: 3.0000 mg | ORAL_TABLET | Freq: Every evening | ORAL | Status: DC | PRN

## 2024-04-18 MED ORDER — IOHEXOL 350 MG/ML SOLN
75.0000 mL | Freq: Once | INTRAVENOUS | Status: AC | PRN
  Administered 2024-04-18: 75 mL via INTRAVENOUS

## 2024-04-18 MED ORDER — BRIMONIDINE TARTRATE 0.2 % OP SOLN
1.0000 [drp] | Freq: Three times a day (TID) | OPHTHALMIC | Status: DC
  Administered 2024-04-18 – 2024-04-19 (×2): 1 [drp] via OPHTHALMIC
  Filled 2024-04-18: qty 5

## 2024-04-18 MED ORDER — TRANEXAMIC ACID FOR INHALATION: 500.0000 mg | Freq: Every day | RESPIRATORY_TRACT | Status: DC | PRN

## 2024-04-18 MED ORDER — METHIMAZOLE 2.5 MG HALF TABLET
2.5000 mg | ORAL_TABLET | Freq: Every day | ORAL | Status: DC
  Administered 2024-04-18 – 2024-04-19 (×2): 2.5 mg via ORAL
  Filled 2024-04-18 (×2): qty 1

## 2024-04-18 MED ORDER — LATANOPROST 0.005 % OP SOLN
1.0000 [drp] | Freq: Every day | OPHTHALMIC | Status: DC
  Administered 2024-04-18: 1 [drp] via OPHTHALMIC
  Filled 2024-04-18: qty 2.5

## 2024-04-18 MED ORDER — TAMSULOSIN HCL 0.4 MG PO CAPS
0.4000 mg | ORAL_CAPSULE | Freq: Every day | ORAL | Status: DC
  Administered 2024-04-18: 0.4 mg via ORAL
  Filled 2024-04-18: qty 1

## 2024-04-18 MED ORDER — ACETAMINOPHEN 325 MG PO TABS: 650.0000 mg | ORAL_TABLET | Freq: Four times a day (QID) | ORAL | Status: DC | PRN

## 2024-04-18 MED ORDER — ROSUVASTATIN CALCIUM 20 MG PO TABS
40.0000 mg | ORAL_TABLET | Freq: Every day | ORAL | Status: DC
  Administered 2024-04-18 – 2024-04-19 (×2): 40 mg via ORAL
  Filled 2024-04-18 (×2): qty 2

## 2024-04-18 NOTE — H&P (Signed)
 Date: 04/18/2024               Patient Name:  Terry Gross MRN: [id number]  DOB: 09-May-1966 Age / Sex: 58 y.o., male   PCP: Bulah Alm RAMAN, PA-C         Medical Service: Internal Medicine Teaching Service         Attending Physician: Dr. Trudy, Mliss Dragon, MD      First Contact: Dr. Remonia Romano, DO    Second Contact: Dr. 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 He was going about his business as usual yesterday, and he laid down to sleep around 10:00 PM.  Then, all of a sudden he felt this need to cough and started coughing up blood which she describes as bright.  This lasted for about 10 minutes, then stopped on its own.  He then came to the emergency department.  He is not having any chest pain, shortness of breath, fevers, chills, nausea, vomiting.  He moved here from [place] in 1982, and has not been incarcerated, and lives at home with his wife.  The last time this happened was in May for which he presented to the emergency department for, and was discharged home with pulmonology follow-up.  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  This was first noted on his CT scan in April 2025, where there was possibility of an arteriovenous malformation in his lung, but no treatment was provided at that time, and then he subsequently established with  pulmonology in April 23rd 2025.   He also has significant peripheral arterial disease, diagnosed in 2020 after a TIA, and most recent vascular ultrasound carotid showed a right carotid stenosis of 40 to 59%, and left carotid with total occlusion.  He has been taking Plavix  for this, and follows with vascular, with his last appointment being March 11, 2024, with plans to repeat carotid duplex in a year.  ED Course: Pulmonology consulted, internal medicine teaching service consulted for admission Meds:   Plavix  75mg   Flomax  .4mg   Crestor  40mg  Cosopt 2-.05%  Xalatan .0005% eye drops Methimazole 2.5mg  daily Ferrous Glucone 324mg   Past Medical History Peripheral arterial disease with total occlusion of left ICA in 2020 Aortic stenosis Hypertension Emphysema Known pulmonary AVM and left middle lobe Toxic thyroid  nodule Past Surgical History:  Procedure Laterality Date   CATARACT EXTRACTION     COLONOSCOPY  07/2020   never    Social:  Lives With: His wife at home Occupation: Intermittently works at his [employer] Support: Friends and family in the area Level of Function: Independent in all ADLs and IADLs PCP: Alm Bulah, PA Substances: Has been smoking half a pack a year for about 30 years.  No alcohol, marijuana, or other drug use  Family History:   Family History  Problem Relation Age of Onset   CVA  Mother    Other Mother        brain tumor   Other Father        complications from prostate sugery   Cancer Father        prostate   Thyroid  disease Neg Hx    Heart disease Neg Hx    Diabetes Neg Hx    Kidney disease Neg Hx    Hypertension Neg Hx    Hyperlipidemia Neg Hx      Allergies: Allergies as of 04/17/2024   (No Known Allergies)    Review of Systems: A complete ROS was negative except as per HPI.   OBJECTIVE:   Physical Exam: Blood pressure (!) 135/58, pulse 68, temperature 97.7 F (36.5 C), resp. rate 17, height 5' 8 (1.727 m),  weight 65 kg, SpO2 94%.  Constitutional: well-appearing male sitting in bed, in no acute distress HENT: normocephalic atraumatic, mucous membranes moist Eyes: conjunctiva non-erythematous Neck: supple Cardiovascular: regular rate and rhythm, no m/r/g Pulmonary/Chest: normal work of breathing on room air, lungs clear to auscultation bilaterally Abdominal: soft, non-tender, non-distended MSK: normal bulk and tone Neurological: alert & oriented x 3  skin: warm and dry Psych: Normal mood and affect  Labs: CBC    Component Value Date/Time   WBC 7.6 04/17/2024 2333   RBC 4.17 (L) 04/17/2024 2333   HGB 13.7 04/17/2024 2333   HGB 14.7 12/27/2023 1011   HCT 41.7 04/17/2024 2333   HCT 45.4 12/27/2023 1011   PLT 206 04/17/2024 2333   PLT 164 12/27/2023 1011   MCV 100.0 04/17/2024 2333   MCV 96 12/27/2023 1011   MCH 32.9 04/17/2024 2333   MCHC 32.9 04/17/2024 2333   RDW 12.9 04/17/2024 2333   RDW 16.0 (H) 12/27/2023 1011   LYMPHSABS 2.1 12/27/2023 1011   MONOABS 0.6 09/17/2023 0019   EOSABS 0.1 12/27/2023 1011   BASOSABS 0.0 12/27/2023 1011     CMP     Component Value Date/Time   NA 138 04/17/2024 2333   NA 140 12/27/2023 1011   K 3.9 04/17/2024 2333   CL 105 04/17/2024 2333   CO2 25 04/17/2024 2333   GLUCOSE 108 (H) 04/17/2024 2333   BUN 9 04/17/2024 2333   BUN 8 12/27/2023 1011   CREATININE 0.54 (L) 04/17/2024 2333   CREATININE 0.75 10/31/2016 0840   CALCIUM  8.7 (L) 04/17/2024 2333   PROT 7.0 04/17/2024 2333   PROT 6.9 11/22/2022 1529   ALBUMIN 3.6 04/17/2024 2333   ALBUMIN 4.2 11/22/2022 1529   AST 17 04/17/2024 2333   ALT 16 04/17/2024 2333   ALKPHOS 58 04/17/2024 2333   BILITOT 0.6 04/17/2024 2333   BILITOT 0.3 11/22/2022 1529   GFRNONAA >60 04/17/2024 2333   GFRAA 126 08/22/2019 0847    Imaging:  Chest x-ray: Heterogenous ground glass disease in the left mid to lower lung suspicious for pneumonia with emphysematous and diffuse bronchial critic  changes  EKG: Not obtained  ASSESSMENT & PLAN:   Assessment & Plan by Problem: Principal Problem:   Hemoptysis Active Problems:   Mixed hyperlipidemia   Tobacco abuse   Terry Gross is a 58 y.o. person living with a history of hypertension, TIA, peripheral arterial disease with total left occlusion of his left ICA who presented with hemoptysis and admitted for hemoptysis on hospital day 0  #Hemoptysis # Known pulmonary AVMs and left lower lobe Diagnosed earlier this year, seen on imaging.  Pulmonology on board recommending IR consult for potential angio  and catheter ablation of his AVM.  Blood count within normal limits currently, with hemoglobin 13.7.  Historically, has had hemoglobins as low as 9.4, diagnosed with iron  deficiency anemia and is currently on iron  supplementation as well.  Lung exam clear, no fever symptoms, and no sputum productive cough, we will hold off on starting antibiotics.  Plan: - Pulmonology on board, appreciate recommendations - IR on board, CT angio ordered - Can use TXA nebulizer if recurrent bleeding - Daily CBC - Holding Plavix  for at least a week  #Total Occlusion of Left ICA #PAD Last seen by vascular in early November, recommended continuing his Plavix .  Currently not having any symptoms of dizziness, weakness.  Will reach out to outpatient vascular office to make them aware.  In the meanwhile, we will resume his statin.  #Iron  Deficiency Anemia Last ferritin level 326 three months ago, Hb stable. On iron  supplementation currently.   #Toxic Thyroid  Nodule  [facility], on methimazole 2.5 mg daily.  Will continue here.  #Aortic Stenosis  #Aortic Regurg Follows with cardiology, last appointment end of September 2025.  Recommendation for follow-up echo in about a year.  Does not appear to be symptomatic from this currently.  #BPH Continuing flomax   #Tobacco Use  Still smokes about 2-3 cigarettes a day,  encouraged to stop.  #Retinal Artery Occlusion  #glaucoma  Continuing his eye drops   Diet: Normal VTE: None IVF: None,None Code: Full  Prior to Admission Living Arrangement: Home, living wife Anticipated Discharge Location: Home Barriers to Discharge: Medical management  Dispo: Admit patient to Observation with expected length of stay less than 2 midnights.  Signed: Sharonda Llamas, MD Internal Medicine Resident PGY-3  04/18/2024, 11:52 AM

## 2024-04-18 NOTE — ED Provider Notes (Signed)
 [place] EMERGENCY DEPARTMENT AT [facility] Provider Note   CSN: [id number] Arrival date & time: 04/17/24  2247     Patient presents with: Hemoptysis   Terry Gross is a 58 y.o. male.   Patient is a 58 year old male with a history of aortic valve insufficiency, carotid artery occlusion on Plavix , PVD, hypertension and known left lung AVM who is presenting today with complaint of hemoptysis.  Patient reports that last night he went to lay down after taking some of his medication when he started to get a tickle in his throat.  He started coughing and initially just coughed up a little bit of blood and then reports he started coughing repeatedly with large amounts of blood.  This worried him and he came to the emergency room.  He reports after about 20 or 30 minutes the hemoptysis did resolve.  He has unfortunately waited for 11 hours and reports he has coughed recently and there has been no further bleeding.  He did not take his Plavix  last night and last had it the day before.  He has not had flu or cold-like symptoms.  He denies any congestion, fever.  He has no chest pain or shortness of breath at this time.  The history is provided by the patient and medical records.       Prior to Admission medications  Medication Sig Start Date End Date Taking? Authorizing Provider  brimonidine  (ALPHAGAN ) 0.2 % ophthalmic solution Place 1 drop into both eyes 3 (three) times daily. 06/27/23   [provider]  clopidogrel  (PLAVIX ) 75 MG tablet Take 1 tablet (75 mg total) by mouth daily. 12/28/23   Tysinger, Alm RAMAN, PA-C  dorzolamide-timolol  (COSOPT) 2-0.5 % ophthalmic solution Place 1 drop into the left eye 2 (two) times daily. 04/08/22   [provider]  ferrous gluconate  (FERGON) 324 MG tablet Take 1 tablet (324 mg total) by mouth 2 (two) times daily with a meal. 12/28/23   Tysinger, Alm RAMAN, PA-C  latanoprost (XALATAN) 0.005 % ophthalmic solution INSTILL 1 DROP IN  LEFT EYE NIGHTLY 04/06/21   Rankin, Arley LABOR, MD  nicotine  (NICODERM CQ  - DOSED IN MG/24 HOURS) 14 mg/24hr patch Place 1 patch (14 mg total) onto the skin daily. 12/07/23   Mannam, Praveen, MD  Omega-3 Fatty Acids (FISH OIL) 1200 MG CAPS Take by mouth daily.    [provider]  rosuvastatin  (CRESTOR ) 40 MG tablet Take 1 tablet (40 mg total) by mouth daily. 06/05/23   Tysinger, Alm RAMAN, PA-C  tamsulosin  (FLOMAX ) 0.4 MG CAPS capsule Take 1 capsule (0.4 mg total) by mouth daily after supper. 06/05/23   Tysinger, Alm RAMAN, PA-C  Varenicline  Tartrate, Starter, (CHANTIX  STARTING MONTH PAK) 0.5 MG X 11 & 1 MG X 42 TBPK Take 1 tablet by mouth daily. 12/07/23   Mannam, Praveen, MD    Allergies: Patient has no known allergies.    Review of Systems  Updated Vital Signs BP (!) 135/58 (BP Location: Left Arm)   Pulse 68   Temp 97.7 F (36.5 C)   Resp 17   Ht 5' 8 (1.727 m)   Wt 65 kg   SpO2 94%   BMI 21.79 kg/m   Physical Exam Vitals and nursing note reviewed.  Constitutional:      General: He is not in acute distress.    Appearance: He is well-developed.  HENT:     Head: Normocephalic and atraumatic.  Eyes:     Conjunctiva/sclera: Conjunctivae normal.  Pupils: Pupils are equal, round, and reactive to light.  Cardiovascular:     Rate and Rhythm: Normal rate and regular rhythm.     Heart sounds: Murmur heard.  Pulmonary:     Effort: Pulmonary effort is normal. No respiratory distress.     Breath sounds: Examination of the left-upper field reveals rhonchi. Examination of the left-middle field reveals rhonchi. Rhonchi present. No wheezing or rales.  Abdominal:     General: There is no distension.     Palpations: Abdomen is soft.     Tenderness: There is no abdominal tenderness. There is no guarding or rebound.  Musculoskeletal:        General: No tenderness. Normal range of motion.     Cervical back: Normal range of motion and neck supple.     Right lower leg: No edema.     Left  lower leg: No edema.  Skin:    General: Skin is warm and dry.     Findings: No erythema or rash.  Neurological:     Mental Status: He is alert and oriented to person, place, and time.  Psychiatric:        Behavior: Behavior normal.     (all labs ordered are listed, but only abnormal results are displayed) Labs Reviewed  COMPREHENSIVE METABOLIC PANEL WITH GFR - Abnormal; Notable for the following components:      Result Value   Glucose, Bld 108 (*)    Creatinine, Ser 0.54 (*)    Calcium  8.7 (*)    All other components within normal limits  CBC - Abnormal; Notable for the following components:   RBC 4.17 (*)    All other components within normal limits  TYPE AND SCREEN    EKG: None  Radiology: DG Chest 2 View Result Date: 04/18/2024 CLINICAL DATA:  Cough and hemoptysis EXAM: CHEST - 2 VIEW COMPARISON:  09/17/2023 FINDINGS: Heterogeneous ground-glass disease in the left mid to lower lung. Normal cardiac size. No pleural effusion or pneumothorax. Emphysema IMPRESSION: Heterogeneous ground-glass disease in the left mid to lower lung, suspicious for pneumonia. Imaging follow-up to resolution is recommended. Emphysema and diffuse bronchitic changes Electronically Signed   By: Luke Bun M.D.   On: 04/18/2024 00:06     Procedures   Medications Ordered in the ED - No data to display                                  Medical Decision Making Amount and/or Complexity of Data Reviewed Labs: ordered. Decision-making details documented in ED Course. Radiology: ordered and independent interpretation performed. Decision-making details documented in ED Course.   Pt with multiple medical problems and comorbidities and presenting today with a complaint that caries a high risk for morbidity and mortality.  Here today with symptoms of hemoptysis.  This is recurrent for the patient and last had an episode in May.  At that time he was treated with antibiotics, followed up with pulmonology  and repeat imaging was clear.  However patient is not having any symptoms today suggestive of pneumonia.  Low suspicion for PE, acute cardiac condition.  Patient did report some blood came out of his nose when he was coughing up so much blood but denies initial epistaxis.  He denies aspiration or difficulty swallowing.  He does take Plavix  regularly for carotid artery stenosis.  Patient also has a known AVM of his left middle lung.  He  has followed with pulmonology for this.  On exam patient is well-appearing and sats are in 94% on room air.  He denies feeling short of breath.  Discussed with Dr. Claudene with pulmonology who recommends that patient be observed overnight and he will talk to interventional radiologist to see if patient would be a candidate for embolization.  I independently interpreted patient's labs and CBC with a normal hemoglobin and white count today, CMP without acute findings.  I have independently visualized and interpreted pt's images today.  Chest x-ray today showing left mid to lower lobe opacity which suspect is most likely blood from a bleed from the AVM.  Critical care recommended no Plavix  for 1 week.  Will admit to hospitalist for observation.  Patient is comfortable with this plan.       Final diagnoses:  Hemoptysis  Pulmonary arteriovenous malformation    ED Discharge Orders     None          Doretha Folks, MD 04/18/24 [phone]

## 2024-04-18 NOTE — Plan of Care (Signed)

## 2024-04-18 NOTE — Consult Note (Signed)
 Chief Complaint: Patient was seen in consultation today for hemoptysis  Chief Complaint  Patient presents with   Hemoptysis   at the request of Claudene Sieving   Referring Physician(s): Claudene Sieving   Supervising Physician: Hughes Simmonds  Patient Status: [facility] - In-pt  History of Present Illness: Terry Gross is a 58 y.o. male with PMHs of HTN, aortic insufficiency, HLD, CAD, L ICA occlusion, and pulmonary AVM, IR was consulted for possible intervention.   Patient has history of smoking cigarette, doppler study on 09/24/18 showed total occlusion of L ICA and partial occlusion of R ICA, he s being evaluated with annual bilateral carotid duplex which has been showing progression, most recent duplex showed 40-59% stenosis. Patient has been taking Plavix .   Patient developed cough with intermittent hemoptysis and underwent CTA chest on 08/20/23 which showed LLL AVM. Patient has been followed by pulmonary.   Since then patient had another episode of hemoptysis in May 2025 and yesterday 04/17/24. In ED he was hemodynamically stable and hemoptysis resolved spontaneously. PCCM was consulted by EDP, IR was consulted for possible pulmonary angiogram with intervention. Repeat CTA chest was obtained per Dr. Thelda recommendation which shows LLL AVM, formal read is pending but it appears that the pulmonary AVM has not changed in size. Complete echocardiogram in August 2025 showed normal heart function.   Patient seen with Dr. Hughes. RN at bedside.  Patient laying in bed, NAD. States that he has not coughed up blood since last night. He did not have episode of hemoptysis between May and now.   Dr. Hughes explained what pulmonary AVM is, he was informed that it is the cause of recurrent hemoptysis. He was notified that he does not need of emergent interventions for the AVM. Recommended that the patient to be scheduled for pulmonary aingogram with intervention under GA as outpatient which he  agreed. Strongly recommended smoking cessation.   Order placed, will be scheduled by IR schedulers.    Past Medical History:  Diagnosis Date   Aortic atherosclerosis    Aortic valve insufficiency 12/15/2014   Moderate by ECHO 12/15/14 normal LV function    Back pain    Carotid artery occlusion    Cataract    Hyperlipidemia    Hypertension    Impaired fasting blood sugar    Left epiretinal membrane 09/12/2019   This condition is resolved after vitrectomy and membrane peel was performed, 02/06/2019.  Topographic distortion completely released.   Loss of balance    Peripheral vascular disease    Smoker    Vitreous hemorrhage of left eye (HCC) 06/10/2019    Past Surgical History:  Procedure Laterality Date   CATARACT EXTRACTION     COLONOSCOPY  07/2020   never    Allergies: Patient has no known allergies.  Medications: Prior to Admission medications  Medication Sig Start Date End Date Taking? Authorizing Provider  brimonidine  (ALPHAGAN ) 0.2 % ophthalmic solution Place 1 drop into the left eye 3 (three) times daily. 06/27/23  Yes [provider]  clopidogrel  (PLAVIX ) 75 MG tablet Take 1 tablet (75 mg total) by mouth daily. 12/28/23  Yes Tysinger, Alm RAMAN, PA-C  dorzolamide-timolol  (COSOPT) 2-0.5 % ophthalmic solution Place 1 drop into the left eye 2 (two) times daily. 04/08/22  Yes [provider]  ferrous gluconate  (FERGON) 324 MG tablet Take 1 tablet (324 mg total) by mouth 2 (two) times daily with a meal. Patient taking differently: Take 324 mg by mouth daily. 12/28/23  Yes Tysinger,  Alm RAMAN, PA-C  latanoprost (XALATAN) 0.005 % ophthalmic solution INSTILL 1 DROP IN LEFT EYE NIGHTLY Patient taking differently: Place 1 drop into both eyes at bedtime. 04/06/21  Yes Rankin, Arley LABOR, MD  methimazole (TAPAZOLE) 5 MG tablet Take 2.5 mg by mouth daily. 03/20/24  Yes [provider]  Omega-3 Fatty Acids (FISH OIL) 1200 MG CAPS Take 1,200 mg by mouth daily.   Yes  [provider]  rosuvastatin  (CRESTOR ) 40 MG tablet Take 1 tablet (40 mg total) by mouth daily. 06/05/23  Yes Tysinger, Alm RAMAN, PA-C  tamsulosin  (FLOMAX ) 0.4 MG CAPS capsule Take 1 capsule (0.4 mg total) by mouth daily after supper. 06/05/23  Yes Tysinger, Alm RAMAN, PA-C  nicotine  (NICODERM CQ  - DOSED IN MG/24 HOURS) 14 mg/24hr patch Place 1 patch (14 mg total) onto the skin daily. Patient not taking: Reported on 04/18/2024 12/07/23   Mannam, Praveen, MD  Varenicline  Tartrate, Starter, (CHANTIX  STARTING MONTH PAK) 0.5 MG X 11 & 1 MG X 42 TBPK Take 1 tablet by mouth daily. Patient not taking: Reported on 04/18/2024 12/07/23   Mannam, Praveen, MD     Family History  Problem Relation Gross of Onset   CVA Mother    Other Mother        brain tumor   Other Father        complications from prostate sugery   Cancer Father        prostate   Thyroid  disease Neg Hx    Heart disease Neg Hx    Diabetes Neg Hx    Kidney disease Neg Hx    Hypertension Neg Hx    Hyperlipidemia Neg Hx     Social History   Socioeconomic History   Marital status: Married    Spouse name: Not on file   Number of children: 0   Years of education: Not on file   Highest education level: Not on file  Occupational History   Not on file  Tobacco Use   Smoking status: Every Day    Current packs/day: 0.25    Average packs/day: 0.3 packs/day for 32.0 years (8.0 ttl pk-yrs)    Types: Cigarettes    Passive exposure: Never   Smokeless tobacco: Never   Tobacco comments:    4-5 cig a day  Vaping Use   Vaping status: Never Used  Substance and Sexual Activity   Alcohol use: No   Drug use: No   Sexual activity: Not on file  Other Topics Concern   Not on file  Social History Narrative   Married, no children.  Exercise some with calisthenics, walking.    From [place] originally.   11/2022   Social Drivers of Health   Tobacco Use: High Risk (04/18/2024)   Patient History    Smoking Tobacco Use: Every Day     Smokeless Tobacco Use: Never    Passive Exposure: Never  Financial Resource Strain: Not on file  Food Insecurity: Not on file  Transportation Needs: Not on file  Physical Activity: Not on file  Stress: Not on file  Social Connections: Not on file  Depression (PHQ2-9): Low Risk (12/27/2023)   Depression (PHQ2-9)    PHQ-2 Score: 0  Alcohol Screen: Not on file  Housing: Not on file  Utilities: Not on file  Health Literacy: Not on file     Review of Systems: A 12 point ROS discussed and pertinent positives are indicated in the HPI above.  All other systems are negative.  Vital Signs: BP (!) 164/66 (BP Location: Right Arm)   Pulse 74   Temp 98.4 F (36.9 C)   Resp 18   Ht 5' 8 (1.727 m)   Wt 143 lb 4.8 oz (65 kg)   SpO2 92%   BMI 21.79 kg/m    Physical Exam Vitals reviewed.  Constitutional:      General: He is not in acute distress.    Appearance: He is not ill-appearing.  HENT:     Head: Normocephalic and atraumatic.  Pulmonary:     Effort: Pulmonary effort is normal.  Abdominal:     General: Abdomen is flat.  Musculoskeletal:     Cervical back: Neck supple.  Skin:    General: Skin is warm and dry.     Coloration: Skin is not jaundiced or pale.  Neurological:     Mental Status: He is alert and oriented to person, place, and time.  Psychiatric:        Mood and Affect: Mood normal.        Behavior: Behavior normal.        Judgment: Judgment normal.        Imaging: DG Chest 2 View Result Date: 04/18/2024 CLINICAL DATA:  Cough and hemoptysis EXAM: CHEST - 2 VIEW COMPARISON:  09/17/2023 FINDINGS: Heterogeneous ground-glass disease in the left mid to lower lung. Normal cardiac size. No pleural effusion or pneumothorax. Emphysema IMPRESSION: Heterogeneous ground-glass disease in the left mid to lower lung, suspicious for pneumonia. Imaging follow-up to resolution is recommended. Emphysema and diffuse bronchitic changes Electronically Signed   By: Luke Bun M.D.    On: 04/18/2024 00:06    Labs:  CBC: Recent Labs    08/20/23 1644 09/17/23 0019 12/27/23 1011 04/17/24 2333  WBC 8.2 7.5 6.3 7.6  HGB 9.4* 10.3* 14.7 13.7  HCT 29.9* 34.0* 45.4 41.7  PLT 236 228 164 206    COAGS: Recent Labs    09/17/23 0019  INR 1.0    BMP: Recent Labs    08/20/23 1644 09/17/23 0019 12/27/23 1011 04/17/24 2333  NA 139 136 140 138  K 4.2 3.9 4.3 3.9  CL 108 105 105 105  CO2 24 24 21 25   GLUCOSE 98 109* 98 108*  BUN 14 16 8 9   CALCIUM  8.6* 8.7* 8.9 8.7*  CREATININE 0.65 0.54* 0.63* 0.54*  GFRNONAA >60 >60  --  >60    LIVER FUNCTION TESTS: Recent Labs    09/17/23 0019 04/17/24 2333  BILITOT 0.6 0.6  AST 20 17  ALT 15 16  ALKPHOS 56 58  PROT 7.3 7.0  ALBUMIN 4.0 3.6    TUMOR MARKERS: No results for input(s): AFPTM, CEA, CA199, CHROMGRNA in the last 8760 hours.  Assessment and Plan: 58 y.o. male with LLL AVM with recurrent hemoptysis.   Patient seen today with Dr. Hughes.  No contraindications for procedure identified in ROS, physical exam, or review of pre-sedation considerations from IR standpoint.  Patient does NOT need to stop the Plavix  for the IR procedure.   Patient will receive a call from IR scheduler to be scheduled for pulmonary angiogram with possible interventions under general anesthesia.    Thank you for this interesting consult.  I greatly enjoyed meeting Terry Gross and look forward to participating in their care.  A copy of this report was sent to the requesting provider on this date.  Electronically Signed: Kadeisha Betsch H Marea Reasner, PA-C 04/18/2024, 2:08 PM   I spent a total of 40  Minutes    in face to face in clinical consultation, greater than 50% of which was counseling/coordinating care for LLL pulmonary AVM.   This chart was dictated using voice recognition software.  Despite best efforts to proofread,  errors can occur which can change the documentation meaning.

## 2024-04-18 NOTE — Hospital Course (Signed)
° ° °#  Hemoptysis # Known pulmonary AVMs and left lower lobe Presented with hemoptysis.Diagnosed earlier this year, seen on imaging.  Blood count within normal limits currently, with hemoglobin 13.7. Historically, has had hemoglobins as low as 9.4, diagnosed with iron  deficiency anemia and is currently on iron  supplementation as well.  CTA showed stable left lower lobe pulmonary AVM with 9 mm nidus with increased surrounding airspace consolidation may represent interval hemorrhage or pneumonia.  TXA nebulizer was available for recurrent bleeding.  Pulmonology was also consulted and recommended IR consult and 24-hour observation.  Plan per IR is for pulmonary arteriography and embolization of pulmonary AVM. Hgb 12.8 at time of discharge.   #Total Occlusion of Left ICA #PAD Last seen by vascular in early November, recommended continuing his Plavix .  Currently not having any symptoms of dizziness, weakness.  Can continue plavix  per IR  in the meanwhile, we will resume his statin.  #Iron  Deficiency Anemia Last ferritin level 326 three months ago, Hb stable. On iron  supplementation currently.    #Toxic Thyroid  Nodule  Follows with endocrinology at [facility], on methimazole 2.5 mg daily.  Will continue here.   #Aortic Stenosis  #Aortic Regurg Follows with cardiology, last appointment end of September 2025.  Recommendation for follow-up echo in about a year.  Does not appear to be symptomatic from this currently.   #BPH Continuing flomax    #Tobacco Use  Still smokes about 2-3 cigarettes a day, encouraged to stop.   #Retinal Artery Occlusion  #glaucoma  Continuing his eye drops

## 2024-04-18 NOTE — Consult Note (Addendum)
 NAME:  Terry Gross, MRN:  [id number], DOB:  1965/10/25, LOS: 0 ADMISSION DATE:  04/17/2024, CONSULTATION DATE:  04/18/24 REFERRING MD:  Doretha, CHIEF COMPLAINT:  hemoptysis   History of Present Illness:  58 year old man with COPD, known LLL AVM who developed large hemoptysis last night that has since resolved.  Happened as he laid down before bedtime.  On plavix  for stroke ppx as he has 100% L ICA occlusion and ~50% on R.  CXR LLL infiltrate in area of AVM No infectious symptoms PCCM asked to help with management  Pertinent  Medical History   Past Medical History:  Diagnosis Date   Aortic atherosclerosis    Aortic valve insufficiency 12/15/2014   Moderate by ECHO 12/15/14 normal LV function    Back pain    Carotid artery occlusion    Cataract    Hyperlipidemia    Hypertension    Impaired fasting blood sugar    Left epiretinal membrane 09/12/2019   This condition is resolved after vitrectomy and membrane peel was performed, 02/06/2019.  Topographic distortion completely released.   Loss of balance    Peripheral vascular disease    Smoker    Vitreous hemorrhage of left eye (HCC) 06/10/2019     Significant Hospital Events: Including procedures, antibiotic start and stop dates in addition to other pertinent events   12/11 admit  Interim History / Subjective:  Admit  Objective    Blood pressure (!) 135/58, pulse 68, temperature 97.7 F (36.5 C), resp. rate 17, height 5' 8 (1.727 m), weight 65 kg, SpO2 94%.       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 Recent Labs  Lab 04/17/24 2333  WBC 7.6    Liver Function Tests: Recent Labs  Lab 04/17/24 2333  AST 17  ALT 16  ALKPHOS 58  BILITOT 0.6  PROT 7.0  ALBUMIN 3.6   No results for input(s): LIPASE, AMYLASE in the last 168 hours. No results for input(s): AMMONIA in the last 168 hours.  ABG No results found for: PHART, PCO2ART, PO2ART, HCO3, TCO2, ACIDBASEDEF, O2SAT   Coagulation Profile: No results for input(s): INR, PROTIME in the last 168 hours.  Cardiac Enzymes: No results for input(s): CKTOTAL,  CKMB, CKMBINDEX, TROPONINI in the last 168 hours.  HbA1C: Hemoglobin A1C  Date/Time Value Ref Range Status  09/04/2023 11:45 AM 6.0 (A) 4.0 - 5.6 % Final   Hgb A1c MFr Bld  Date/Time Value Ref Range Status  12/27/2023 10:10 AM 5.7 (H) 4.8 - 5.6 % Final    Comment:             Prediabetes: 5.7 - 6.4          Diabetes: >6.4          Glycemic control for adults with diabetes: <7.0   06/05/2023 03:00 PM 5.9 (H) 4.8 - 5.6 % Final    Comment:             Prediabetes: 5.7 - 6.4          Diabetes: >6.4          Glycemic control for adults with diabetes: <7.0     CBG: No results for input(s): GLUCAP in the  last 168 hours.  Review of Systems:    Positive Symptoms in bold:  Constitutional fevers, chills, weight loss, fatigue, anorexia, malaise  Eyes decreased vision, double vision, eye irritation  Ears, Nose, Mouth, Throat sore throat, trouble swallowing, sinus congestion  Cardiovascular chest pain, paroxysmal nocturnal dyspnea, lower ext edema, palpitations   Respiratory SOB, cough, DOE, hemoptysis, wheezing  Gastrointestinal nausea, vomiting, diarrhea  Genitourinary burning with urination, trouble urinating  Musculoskeletal joint aches, joint swelling, back pain  Integumentary  rashes, skin lesions  Neurological focal weakness, focal numbness, trouble speaking, headaches  Psychiatric depression, anxiety, confusion  Endocrine polyuria, polydipsia, cold intolerance, heat intolerance  Hematologic abnormal bruising, abnormal bleeding, unexplained nose bleeds  Allergic/Immunologic recurrent infections, hives, swollen lymph nodes     Past Medical History:  He,  has a past medical history of Aortic atherosclerosis, Aortic valve insufficiency (12/15/2014), Back pain, Carotid artery occlusion, Cataract, Hyperlipidemia, Hypertension, Impaired fasting blood sugar, Left epiretinal membrane (09/12/2019), Loss of balance, Peripheral vascular disease, Smoker, and Vitreous hemorrhage of left eye (HCC) (06/10/2019).   Surgical History:   Past Surgical History:  Procedure Laterality Date   CATARACT EXTRACTION     COLONOSCOPY  07/2020   never     Social History:   reports that he has been smoking cigarettes. He has a 8 pack-year smoking history. He has never been exposed to tobacco smoke. He has never used smokeless tobacco. He reports that he does not drink alcohol and does not use drugs.   Family History:  His family history includes CVA in his mother; Cancer in his father; Other in his father and mother. There is no history of Thyroid  disease, Heart disease, Diabetes, Kidney disease,  Hypertension, or Hyperlipidemia.   Allergies Allergies[1]   Home Medications  Prior to Admission medications  Medication Sig Start Date End Date Taking? Authorizing Provider  brimonidine  (ALPHAGAN ) 0.2 % ophthalmic solution Place 1 drop into both eyes 3 (three) times daily. 06/27/23   [provider]  clopidogrel  (PLAVIX ) 75 MG tablet Take 1 tablet (75 mg total) by mouth daily. 12/28/23   Tysinger, Alm RAMAN, PA-C  dorzolamide-timolol  (COSOPT) 2-0.5 % ophthalmic solution Place 1 drop into the left eye 2 (two) times daily. 04/08/22   [provider]  ferrous gluconate  (FERGON) 324 MG tablet Take 1 tablet (324 mg total) by mouth 2 (two) times daily with a meal. 12/28/23   Tysinger, Alm RAMAN, PA-C  latanoprost (XALATAN) 0.005 % ophthalmic solution INSTILL 1 DROP IN LEFT EYE NIGHTLY  04/06/21   Rankin, Arley LABOR, MD  nicotine  (NICODERM CQ  - DOSED IN MG/24 HOURS) 14 mg/24hr patch Place 1 patch (14 mg total) onto the skin daily. 12/07/23   Mannam, Praveen, MD  Omega-3 Fatty Acids (FISH OIL) 1200 MG CAPS Take by mouth daily.    [provider]  rosuvastatin  (CRESTOR ) 40 MG tablet Take 1 tablet (40 mg total) by mouth daily. 06/05/23   Tysinger, Alm RAMAN, PA-C  tamsulosin  (FLOMAX ) 0.4 MG CAPS capsule Take 1 capsule (0.4 mg total) by mouth daily after supper. 06/05/23   Tysinger, Alm RAMAN, PA-C  Varenicline  Tartrate, Starter, (CHANTIX  STARTING MONTH PAK) 0.5 MG X 11 & 1 MG X 42 TBPK Take 1 tablet by mouth daily. 12/07/23   Mannam, Praveen, MD     Critical care time: N/A              [1] No Known Allergies

## 2024-04-19 DIAGNOSIS — I739 Peripheral vascular disease, unspecified: Secondary | ICD-10-CM | POA: Diagnosis present

## 2024-04-19 DIAGNOSIS — Q2572 Congenital pulmonary arteriovenous malformation: Secondary | ICD-10-CM | POA: Insufficient documentation

## 2024-04-19 DIAGNOSIS — D509 Iron deficiency anemia, unspecified: Secondary | ICD-10-CM | POA: Insufficient documentation

## 2024-04-19 DIAGNOSIS — F17210 Nicotine dependence, cigarettes, uncomplicated: Secondary | ICD-10-CM | POA: Diagnosis not present

## 2024-04-19 DIAGNOSIS — I6522 Occlusion and stenosis of left carotid artery: Secondary | ICD-10-CM | POA: Diagnosis not present

## 2024-04-19 LAB — BASIC METABOLIC PANEL WITH GFR
Anion gap: 6 (ref 5–15)
BUN: 12 mg/dL (ref 6–20)
CO2: 27 mmol/L (ref 22–32)
Calcium: 8.6 mg/dL — ABNORMAL LOW (ref 8.9–10.3)
Chloride: 107 mmol/L (ref 98–111)
Creatinine, Ser: 0.68 mg/dL (ref 0.61–1.24)
GFR, Estimated: >60 mL/min (ref >60)
Glucose, Bld: 99 mg/dL (ref 70–99)
Potassium: 3.8 mmol/L (ref 3.5–5.1)
Sodium: 140 mmol/L (ref 135–145)

## 2024-04-19 LAB — CBC
HCT: 37.3 % — ABNORMAL LOW (ref 39.0–52.0)
Hemoglobin: 12.8 g/dL — ABNORMAL LOW (ref 13.0–17.0)
MCH: 33.8 pg (ref 26.0–34.0)
MCHC: 34.3 g/dL (ref 30.0–36.0)
MCV: 98.4 fL (ref 80.0–100.0)
Platelets: 168 K/uL (ref 150–400)
RBC: 3.79 MIL/uL — ABNORMAL LOW (ref 4.22–5.81)
RDW: 12.8 % (ref 11.5–15.5)
WBC: 7.1 K/uL (ref 4.0–10.5)
nRBC: 0 % (ref 0.0–0.2)

## 2024-04-19 MED ORDER — CLOPIDOGREL BISULFATE 75 MG PO TABS
75.0000 mg | ORAL_TABLET | Freq: Every day | ORAL | Status: DC
  Administered 2024-04-19: 75 mg via ORAL
  Filled 2024-04-19: qty 1

## 2024-04-19 NOTE — Discharge Instructions (Addendum)
 You were hospitalized because you were coughing up blood. We found that a blood vessel was likely causing this, and we discussed your case with the appropriate specialists. Thank you for allowing us  to be part of your care.  Please hold off on taking your Plavix  for one week. You may resume Plavix  on 12/19.  Interventional Radiology will contact you to schedule your procedure.  Please return to the hospital or go to the nearest emergency room if you cough up bright red blood again, develop new fevers, or experience any worsening symptoms.   Dr. Artia Singley

## 2024-04-19 NOTE — Plan of Care (Signed)

## 2024-04-19 NOTE — Discharge Summary (Cosign Needed)
 Name: Terry Gross MRN: [id number] DOB: Oct 05, 1965 58 y.o. PCP: Terry Gross  Date of Admission: 04/17/2024 11:02 PM Date of Discharge: 04/19/2024  1:52 PM Attending Physician: Dr. Mliss Gross  Discharge Diagnosis: 1. 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       TAKE these medications    brimonidine  0.2 % ophthalmic solution Commonly known as: ALPHAGAN  Place 1 drop into the left eye 3 (three) times daily.   dorzolamide-timolol  2-0.5 % ophthalmic solution Commonly known as: COSOPT Place 1 drop into the left eye 2 (two) times daily.   ferrous gluconate  324 MG tablet Commonly known as: FERGON Take 1 tablet (324 mg total) by mouth 2 (two) times daily with a meal. What changed: when to take this   Fish Oil 1200 MG Caps Take 1,200 mg by mouth daily.   latanoprost  0.005 % ophthalmic solution Commonly known as: XALATAN  INSTILL 1 DROP IN LEFT EYE NIGHTLY What changed: See the new instructions.   methimazole  5 MG tablet Commonly known as: TAPAZOLE  Take 2.5 mg by mouth daily.   nicotine  14 mg/24hr patch Commonly known as: NICODERM CQ  - dosed in mg/24 hours Place 1 patch (14 mg total) onto the skin daily.   rosuvastatin  40 MG tablet Commonly known as: Crestor  Take 1 tablet (40 mg total) by mouth daily.   tamsulosin  0.4 MG Caps capsule Commonly known as: FLOMAX  Take 1 capsule (0.4 mg  total) by mouth daily after supper.   Varenicline  Tartrate (Starter) 0.5 MG X 11 & 1 MG X 42 Tbpk Commonly known as: Chantix  Starting Month Pak Take 1 tablet by mouth daily.        Disposition and follow-up:   Terry Gross was discharged from [facility] in stable condition.  At the hospital follow up visit please address:  1.  Pulmonary AVM-patient should hold Plavix  until 04/26/24 in the setting of this recent event.  If patient has worsening hemoptysis should come back to the hospital. Vascular has been notified about patient stopping plavix  temporarily. He will be contacted by IR to arrange for his interventional radiology appt.  Smoking cessation-was previously prescribed Chantix  and nicotine  patches but patient reports not using these.  Did note emphysema on his CT scan of his lungs.  Important to quit smoking.  2.  Labs / imaging needed at time of follow-up: none  3.  Pending labs/ test needing follow-up: none  Follow-up Appointments:  Will have procedure scheduled with IR.  Should also follow-up with primary care physician to continue routine care.  Hospital Course by problem list: Terry Gross is a 58 y.o. person living with a history of hypertension, TIA, peripheral arterial disease with total left occlusion of his left ICA who presented with hemoptysis and admitted for hemoptysis due to a known pulmonary AVM.   #Hemoptysis # Known pulmonary AVMs and left lower lobe Presented  with hemoptysis.Diagnosed earlier this year, seen on imaging.  Blood count within normal limits currently, with hemoglobin 13.7. Historically, has had hemoglobins as low as 9.4, diagnosed with iron  deficiency anemia and is currently on iron  supplementation as well.  CTA showed stable left lower lobe pulmonary AVM with 9 mm nidus with increased surrounding airspace consolidation may represent interval hemorrhage or pneumonia.  TXA nebulizer was available for recurrent  bleeding.  Pulmonology was also consulted and recommended IR consult and 24-hour observation.  Plan per IR is for pulmonary arteriography and embolization of pulmonary AVM. Hgb 12.8 at time of discharge.   #Total Occlusion of Left ICA #PAD Last seen by vascular in early November.  We notified them of recommendation to hold Plavix  until 04/26/24 in setting of this AVM hemorrhage. Thereafter, IR noted he will not have to pause the Plavix  prior to his anticipated procedure.   #Iron  Deficiency Anemia Last ferritin level 326 three months ago, Hb stable. On iron  supplementation currently.    #Toxic Thyroid  Nodule  Follows with endocrinology at [facility], on methimazole  2.5 mg daily.      #Tobacco Use  Still smokes about 2-3 cigarettes a day, encouraged to stop.   Discharge Subjective:  Patient is doing well.  No additional hemoptysis since admission, no dyspnea or significant cough.  Patient is agreeable to plan and understands that if he starts having more hemoptysis he should return back to the hospital  Discharge Exam:   BP 111/60 (BP Location: Left Arm)   Pulse 71   Temp 98 F (36.7 C)   Resp 18   Ht 5' 8 (1.727 m)   Wt 65 kg   SpO2 93%   BMI 21.79 kg/m    Constitutional: well-appearing male sitting in bed, in no acute distress HENT: normocephalic atraumatic, mucous membranes moist Eyes: conjunctiva non-erythematous Neck: supple Cardiovascular: regular rate and rhythm, no m/r/g Pulmonary/Chest: normal work of breathing on room air, lungs clear to auscultation bilaterally Abdominal: soft, non-tender, non-distended MSK: normal bulk and tone Neurological: alert & oriented x 3  skin: warm and dry Psych: Normal mood and affect  Pertinent Labs, Studies, and Procedures:     Latest Ref Rng & Units 04/19/2024    5:30 AM 04/17/2024   11:33 PM 12/27/2023   10:11 AM  CBC  WBC 4.0 - 10.5 K/uL 7.1  7.6  6.3   Hemoglobin 13.0 - 17.0 g/dL 87.1  86.2  85.2   Hematocrit 39.0 - 52.0  % 37.3  41.7  45.4   Platelets 150 - 400 K/uL 168  206  164        Latest Ref Rng & Units 04/19/2024    5:30 AM 04/17/2024   11:33 PM 12/27/2023   10:11 AM  CMP  Glucose 70 - 99 mg/dL 99  891  98   BUN 6 - 20 mg/dL 12  9  8    Creatinine 0.61 - 1.24 mg/dL 9.31  9.45  9.36   Sodium 135 - 145 mmol/L 140  138  140   Potassium 3.5 - 5.1 mmol/L 3.8  3.9  4.3   Chloride 98 - 111 mmol/L 107  105  105   CO2 22 - 32 mmol/L 27  25  21    Calcium  8.9 - 10.3 mg/dL 8.6  8.7  8.9   Total Protein 6.5 - 8.1 g/dL  7.0    Total Bilirubin 0.0 - 1.2 mg/dL  0.6    Alkaline Phos 38 - 126 U/L  58    AST 15 - 41 U/L  17    ALT 0 - 44 U/L  16      CT Angio Chest Pulmonary Embolism (PE) W or WO Contrast Result Date: 04/18/2024 EXAM: CTA CHEST 04/18/2024 01:24:09 PM TECHNIQUE: CTA of the chest was performed after the administration of 75 mL of iohexol  (OMNIPAQUE ) 350 MG/ML injection. Multiplanar reformatted images are provided for review. MIP images are provided for review. Automated exposure control, iterative reconstruction, and/or weight based adjustment of the mA/kV was utilized to reduce the radiation dose to as low as reasonably achievable. COMPARISON: 09/17/2023 and previous. CLINICAL HISTORY: Eval for LLL AVM. Hemoptysis, follow-up pulmonary AVM. FINDINGS: PULMONARY ARTERIES: Pulmonary arteries are adequately opacified for evaluation. No acute pulmonary embolus. Main pulmonary artery is normal in caliber. MEDIASTINUM: The heart and pericardium demonstrate no acute abnormality. Aortic leaflet coarse calcification. Scattered coronary calcifications. Scattered calcified plaque in the aortic arch, involving great vessel origins, without high-grade stenosis or occlusion. LYMPH NODES: No mediastinal, hilar or axillary lymphadenopathy. LUNGS AND PLEURA: AVM in the superior segment of the left lower lobe with 9 mm nidus, grossly stable from prior studies back to 08/20/23. There is, however, more airspace consolidation  surrounding the lesion and much of the superior segment, to a lesser degree in the posterior and lateral basal segments of the left lower lobe. Pulmonary emphysema with innumerable subpleural blebs in the upper lobes. No evidence of pleural effusion or pneumothorax. UPPER ABDOMEN: Limited images of the upper abdomen are unremarkable. SOFT TISSUES AND BONES: No acute bone or soft tissue abnormality. IMPRESSION: 1. Stable left lower lobe pulmonary AVM with 9 mm nidus, with increased surrounding airspace consolidation, may represent interval hemorrhage or pneumonia. 2. Pulmonary emphysema. Consider evaluation for a low-dose CT lung cancer screening program. 3. Aortic valve , scattered coronary , and  aortic arch calcifications. Electronically signed by: Katheleen Faes MD 04/18/2024 02:15 PM EST RP Workstation: [id number]   DG Chest 2 View Result Date: 04/18/2024 CLINICAL DATA:  Cough and hemoptysis EXAM: CHEST - 2 VIEW COMPARISON:  09/17/2023 FINDINGS: Heterogeneous ground-glass disease in the left mid to lower lung. Normal cardiac size. No pleural effusion or pneumothorax. Emphysema IMPRESSION: Heterogeneous ground-glass disease in the left mid to lower lung, suspicious for pneumonia. Imaging follow-up to resolution is recommended. Emphysema and diffuse bronchitic changes Electronically Signed   By: Luke Bun M.D.   On: 04/18/2024 00:06     Discharge Instructions: Discharge Instructions     Call MD for:  difficulty breathing, headache or visual disturbances   Complete by: As directed    Call MD for:  temperature >100.4   Complete by: As directed    Increase activity slowly   Complete by: As directed        Signed: D'Mello, Jearld Hemp 04/20/2024, 8:31 AM

## 2024-04-20 DIAGNOSIS — Q2730 Arteriovenous malformation, site unspecified: Secondary | ICD-10-CM

## 2024-04-22 ENCOUNTER — Telehealth: Payer: Self-pay

## 2024-04-22 NOTE — Transitions of Care (Post Inpatient/ED Visit) (Unsigned)
° °  04/22/2024  Name: Terry Gross MRN: [id number] DOB: 1966/01/06  Today's TOC FU Call Status: Today's TOC FU Call Status:: Unsuccessful Call (1st Attempt) Unsuccessful Call (1st Attempt) Date: 04/22/24  Attempted to reach the patient regarding the most recent Inpatient/ED visit.  Follow Up Plan: Additional outreach attempts will be made to reach the patient to complete the Transitions of Care (Post Inpatient/ED visit) call.   Signature Julian Lemmings, LPN [facility] Nurse Health Advisor Direct Dial [phone]

## 2024-04-23 NOTE — Telephone Encounter (Signed)
 Copied from CRM [id number]. Topic: General - Other >> Apr 23, 2024  9:31 AM Myrick T wrote: Reason for CRM: patient was returning a call from yesterday. Please f/u with patient

## 2024-04-24 NOTE — Transitions of Care (Post Inpatient/ED Visit) (Signed)
° °  04/24/2024  Name: Terry Gross MRN: [id number] DOB: 1965-12-12  Today's TOC FU Call Status: Today's TOC FU Call Status:: Unsuccessful Call (3rd Attempt) Unsuccessful Call (1st Attempt) Date: 04/22/24 Unsuccessful Call (3rd Attempt) Date: 04/24/24  Attempted to reach the patient regarding the most recent Inpatient/ED visit.  Follow Up Plan: Additional outreach attempts will be made to reach the patient to complete the Transitions of Care (Post Inpatient/ED visit) call.   Signature Julian Lemmings, LPN [facility] Nurse Health Advisor Direct Dial [phone]

## 2024-04-30 ENCOUNTER — Other Ambulatory Visit: Payer: Self-pay

## 2024-04-30 ENCOUNTER — Encounter (HOSPITAL_COMMUNITY): Payer: Self-pay | Admitting: Interventional Radiology

## 2024-04-30 ENCOUNTER — Other Ambulatory Visit (HOSPITAL_COMMUNITY): Payer: Self-pay | Admitting: Student

## 2024-04-30 DIAGNOSIS — Z01812 Encounter for preprocedural laboratory examination: Secondary | ICD-10-CM

## 2024-04-30 NOTE — Progress Notes (Signed)
 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 He presented late to the ED on 12/10/205 with 15-20 minutes of hemoptysis with some dizziness. Notes suggest he had a prolonged wait in the ED with hemoptysis resolving within 30 minutes. He had held his Plavix  due to bleeding. He denied sick symptoms. He had known pulmonary LLL AVM, with hemoptysis thought likely form AVM hemorrhage (as no symptoms of PNA).  Pulmonary and IR consulted. Hold Plavix  for at least on week advised with over night admission. Consider TXA nebs if recurrent bleeding. Discharged 02/18/2024 with stable HGB 13.7. IR to schedule pulmonary arteriography and embolization of pulmonary AVM. Smoking cessation advised.   Other history includes smoking, HTN, HLD, PVD (claudication with left SFA occlusion), carotid artery disease (left ICA CTO, 40-59% RICA 03/2024 US ), left retinal artery occlusion, aortic atherosclerosis, AV disease (moderate AR & AS 12/18/2023), subclinical hyperthyroidism with toxic right thyroid  nodule, on Tapazole ), LLL pulmonary AVM.  Last cardiology follow-up with Dr. Elmira was on 02/06/2024. 12/2023 TTE showed increase gradients for still AS and AR remain in the moderate range. He was clinically asymptomatic. Repeat echo in 1 year planned. He was given information about AS clinical trials (Katalyst AV). Last carotid US  (per Dr. ALONSO Malvina New) on 03/11/2024 showed known left ICA CTA, 40-59% RICA stenosis.   Evaluated by endocrinologist Dr. Beryl for subclinical hyperthyroidism with increased uptake of a right thyroid  nodule. Patient was asymptomatic. On  03/19/2024 TSH 0.305, Free T4 normal at 1.60, Free T3 normal at 3.4. Given his CV disease (carotid and aortic stenosis), treatment was recommended. He was started on methimazole  with plans to repeat labs in 6 weeks and 3 months with follow-up.   Per Dr. Hughes, patient can continue Plavix .   He had labs during recent admission. Update as needed on the day of surgery.    VS:  Wt Readings from Last 3 Encounters:  04/17/24 65 kg  03/11/24 65.3 kg  02/06/24 64.9 kg   BP Readings from Last 3 Encounters:  04/19/24 111/60  03/11/24 115/72  02/06/24 105/62   Pulse Readings from Last 3 Encounters:  04/19/24 71  03/11/24 62  02/06/24 65    PROVIDERS: Tysinger, Alm RAMAN, PA-C is PCP  Elmira Penman, MD is cardiologist Theophilus Roosevelt, MD is pulmonlogist Beryl Cough, MD is endocrinologist ([facility])  LABS: Most recent lab results from 04/17/2024-04/19/2024 in [facility] include: Lab Results  Component Value Date   WBC 7.1 04/19/2024   HGB 12.8 (L) 04/19/2024   HCT 37.3 (L) 04/19/2024   PLT 168 04/19/2024   GLUCOSE 99 04/19/2024   CHOL 108 12/27/2023   TRIG 49 12/27/2023   HDL 45 12/27/2023   LDLCALC 51 12/27/2023   ALT 16 04/17/2024   AST 17 04/17/2024   NA 140 04/19/2024   K 3.8 04/19/2024   CL 107 04/19/2024   CREATININE 0.68 04/19/2024   BUN 12 04/19/2024   CO2 27 04/19/2024   TSH 0.31 (A) 03/19/2024   INR 1.0 09/17/2023   HGBA1C 5.7 (H) 12/27/2023    PFTs 12/07/2023: FVC 3.11 (70%), post 2.94 (66%). FEV1 2.15 (63%), post 1.86 (55%).  DLCO unc 16.68 (64%).   IMAGES: CTA Chest 04/18/2024: IMPRESSION: 1. Stable left lower lobe pulmonary AVM with 9 mm nidus, with increased surrounding airspace consolidation, may represent interval hemorrhage or pneumonia. 2. Pulmonary emphysema. Consider evaluation for a low-dose CT lung cancer screening program. 3. Aortic valve , scattered coronary , and  aortic arch calcifications.  CXR 04/17/2024: IMPRESSION: Heterogeneous  ground-glass disease in the left mid to lower lung, suspicious for pneumonia. Imaging follow-up to resolution is recommended. Emphysema and diffuse bronchitic changes   NM Thyroid  Scan & Uptake 02/21/2024: IMPRESSION: - Small focus of increased uptake in the right thyroid  lobe consistent with a hyperfunctioning adenoma. - Normal range 4 and 24 uptakes.  US  Thyroid  12/14/2023 ([facility]): IMPRESSION:  Right-sided mid-pole solid hypoechoic 0.72 x 0.78 x 0.85 cm thyroid  nodule.  Otherwise normal appearing thyroid    CT Chest High Resolution 11/27/2023: IMPRESSION: 1. Interval resolution of previously seen infectious or inflammatory ground-glass airspace opacity throughout the left lung. 2. Minimal dependent irregular interstitial opacity and ground-glass, nonspecific and generally most suggestive of sequelae of prior infection or aspiration without convincing evidence of interstitial lung disease. If there is high clinical suspicion for interstitial lung disease, this is technically characterized as early indeterminate for UIP pattern and if fibrotic most likely reflects smoking-related respiratory bronchiolitis spectrum interstitial lung disease in this setting. Findings are indeterminate for UIP per consensus guidelines: Diagnosis of Idiopathic Pulmonary Fibrosis: An Official ATS/ERS/JRS/ALAT Clinical Practice Guideline. Am JINNY Honey Crit Care Med Vol 198, Iss 5, [phone], Jan 07 2017. 3. Moderate, predominantly paraseptal emphysema and diffuse bilateral bronchial wall thickening. 4. Unchanged small nodule in the dependent left lower lobe with feeding vessels consistent with a small pulmonary AVM, measuring 0.7 cm. 5. Coronary artery disease. 6. Dense aortic valve calcifications. Correlate for echocardiographic evidence of aortic valve dysfunction. - Aortic Atherosclerosis (ICD10-I70.0) and Emphysema (ICD10-J43.9).    EKG: EKG 02/06/2024: Normal sinus rhythm Normal ECG When compared  with ECG of 17-Sep-2023 01:17, No significant change was found Confirmed by Elmira Penman [phone]) on 02/06/2024 11:18:17 AM   CV:  US  Carotid 03/11/2024: Summary:  - Right Carotid: Velocities in the right ICA are consistent with a 40-59% stenosis.  - Left Carotid: Evidence consistent with a total occlusion of the left ICA.  - Vertebrals:  Bilateral vertebral arteries demonstrate antegrade flow.  - Subclavians: Normal flow hemodynamics were seen in bilateral subclavian arteries.    Echo 12/18/2023: IMPRESSIONS   1. Left ventricular ejection fraction, by estimation, is 60 to 65%. Left ventricular ejection fraction by 3D volume is 63 %. The left ventricle has normal function. The left ventricle has no regional wall motion abnormalities. Left ventricular diastolic  parameters are consistent with Grade I diastolic dysfunction (impaired relaxation). The average left ventricular global longitudinal strain is -22.2 %. The global longitudinal strain is normal.   2. Right ventricular systolic function is normal. The right ventricular size is normal. There is normal pulmonary artery systolic pressure. The estimated right ventricular systolic pressure is 28.4 mmHg.   3. Left atrial size was mildly dilated.   4. The mitral valve is normal in structure. Trivial mitral valve regurgitation. No evidence of mitral stenosis.   5. The aortic valve is tricuspid. Aortic valve regurgitation is moderate. Moderate aortic valve stenosis. Aortic valve area, by VTI measures 1.40 cm. Aortic valve mean gradient measures 37.0 mmHg. Aortic regurgitation PHT measures 417 msec. Moderate aortic stenosis is present. Aortic valve peak gradient measures 62.7 mmHg.  6. The inferior  vena cava is normal in size with greater than 50% respiratory variability, suggesting right atrial pressure of 3 mmHg.    Past Medical History:  Diagnosis Date   Aortic atherosclerosis    Aortic stenosis    Aortic valve insufficiency 12/15/2014    Moderate by ECHO 12/15/14 normal LV function    Back pain    Carotid artery occlusion    Cataract    Hyperlipidemia    Hypertension    Impaired fasting blood sugar    Left epiretinal membrane 09/12/2019   This condition is resolved after vitrectomy and membrane peel was performed, 02/06/2019.  Topographic distortion completely released.   Loss of balance    Peripheral vascular disease    Pulmonary arteriovenous malformation    Smoker    Vitreous hemorrhage of left eye (HCC) 06/10/2019    Past Surgical History:  Procedure Laterality Date   CATARACT EXTRACTION     COLONOSCOPY  07/2020   never    MEDICATIONS:  brimonidine  (ALPHAGAN ) 0.2 % ophthalmic solution   clopidogrel  (PLAVIX ) 75 MG tablet   dorzolamide -timolol  (COSOPT ) 2-0.5 % ophthalmic solution   ferrous gluconate  (FERGON) 324 MG tablet   latanoprost  (XALATAN ) 0.005 % ophthalmic solution   methimazole  (TAPAZOLE ) 5 MG tablet   nicotine  (NICODERM CQ  - DOSED IN MG/24 HOURS) 14 mg/24hr patch   Omega-3 Fatty Acids (FISH OIL) 1200 MG CAPS   rosuvastatin  (CRESTOR ) 40 MG tablet   tamsulosin  (FLOMAX ) 0.4 MG CAPS capsule   Varenicline  Tartrate, Starter, (CHANTIX  STARTING MONTH PAK) 0.5 MG X 11 & 1 MG X 42 TBPK    Isaiah Ruder, PA-C Surgical Short Stay/Anesthesiology [facility] Phone [phone] [facility] Phone [phone] 04/30/2024 12:31 PM

## 2024-04-30 NOTE — Anesthesia Preprocedure Evaluation (Addendum)
 "                                  Anesthesia Evaluation  Patient identified by MRN, date of birth, ID band Patient awake    Reviewed: Allergy & Precautions, NPO status , Patient's Chart, lab work & pertinent test results  Airway Mallampati: II  TM Distance: >3 FB Neck ROM: Full    Dental  (+) Edentulous Upper, Edentulous Lower   Pulmonary COPD,  COPD inhaler, Current Smoker and Patient abstained from smoking. Hemoptysis Right pulmonary AVM   Pulmonary exam normal breath sounds clear to auscultation       Cardiovascular hypertension, + Peripheral Vascular Disease  Normal cardiovascular exam+ Valvular Problems/Murmurs AS and AI  Rhythm:Regular Rate:Normal  Carotid Doppler 03/11/24 Right Carotid: Velocities in the right ICA are consistent with a 40-59%                 stenosis.  Left Carotid: Evidence consistent with a total occlusion of the left ICA.    Echo 12/18/2023: IMPRESSIONS  1. Left ventricular ejection fraction, by estimation, is 60 to 65%. Left ventricular ejection fraction by 3D volume is 63 %. The left ventricle has normal function. The left ventricle has no regional wall motion abnormalities. Left ventricular diastolic parameters are consistent with Grade I diastolic dysfunction (impaired relaxation). The average left ventricular global longitudinal strain is -22.2 %. The global longitudinal strain is normal.  2. Right ventricular systolic function is normal. The right ventricular size is normal. There is normal pulmonary artery systolic pressure. The estimated right ventricular systolic pressure is 28.4 mmHg.  3. Left atrial size was mildly dilated.  4. The mitral valve is normal in structure. Trivial mitral valve regurgitation. No evidence of mitral stenosis.  5. The aortic valve is tricuspid. Aortic valve regurgitation is moderate. Moderate aortic valve stenosis. Aortic valve area, by VTI measures 1.40 cm. Aortic valve mean gradient measures 37.0  mmHg. Aortic regurgitation PHT measures 417 msec. Moderate aortic stenosis is present. Aortic valve peak gradient measures 62.7 mmHg. 6. The inferior vena cava is normal in size with greater than 50% respiratory variability, suggesting right atrial pressure of 3 mmHg.   Left ICA occlusion    Neuro/Psych Left central retinal vein occlusion Glaucoma negative neurological ROS  negative psych ROS   GI/Hepatic negative GI ROS, Neg liver ROS,,,  Endo/Other  Hyperlipidemia Impaired fasting glucose  Renal/GU negative Renal ROS  Chemistry         Component                Value               Date/Time                 NA                       140                 04/19/2024 0530           NA                       140                 12/27/2023 1011           K  3.8                 04/19/2024 0530           CL                       107                 04/19/2024 0530           CO2                      27                  04/19/2024 0530           BUN                      12                  04/19/2024 0530           BUN                      8                   12/27/2023 1011           CREATININE               0.68                04/19/2024 0530           CREATININE               0.75                10/31/2016 0840             Component                Value               Date/Time                 CALCIUM                   8.6 (L)             04/19/2024 0530           ALKPHOS                  58                  04/17/2024 2333           AST                      17                  04/17/2024 2333           ALT                      16                  04/17/2024 2333           BILITOT                  0.6  04/17/2024 2333           BILITOT                  0.3                 11/22/2022 1529       '   BPH    Musculoskeletal   Abdominal   Peds  Hematology  (+) Blood dyscrasia, anemia Plavix  therapy- last dose 12/25 pm  Lab Results       Component                Value               Date                      WBC                      7.1                 04/19/2024                HGB                      12.8 (L)            04/19/2024                HCT                      37.3 (L)            04/19/2024                MCV                      98.4                04/19/2024                PLT                      168                 04/19/2024              Anesthesia Other Findings   Reproductive/Obstetrics                              Anesthesia Physical Anesthesia Plan  ASA: 3  Anesthesia Plan: General   Post-op Pain Management: Minimal or no pain anticipated   Induction: Intravenous  PONV Risk Score and Plan: 2 and Treatment may vary due to age or medical condition, Ondansetron , Dexamethasone , Midazolam , TIVA and Propofol  infusion  Airway Management Planned: Oral ETT  Additional Equipment: Arterial line  Intra-op Plan:   Post-operative Plan: Extubation in OR  Informed Consent: I have reviewed the patients History and Physical, chart, labs and discussed the procedure including the risks, benefits and alternatives for the proposed anesthesia with the patient or authorized representative who has indicated his/her understanding and acceptance.     Dental advisory given  Plan Discussed with: CRNA and Anesthesiologist  Anesthesia Plan Comments: (PAT note written 04/30/2024 by Isaiah Ruder, PA-C.  )         Anesthesia Quick Evaluation  "

## 2024-04-30 NOTE — Progress Notes (Addendum)
 SDW CALL  Patient was given pre-op instructions over the phone. The opportunity was given for the patient to ask questions. No further questions asked. Patient verbalized understanding of instructions given.   PCP - Terry Gross Cardiologist - Terry Patwardhan,MD-LOV 02/06/24  PPM/ICD - denies Device Orders -  Rep Notified -   Chest x-ray - 04/17/24 EKG - 02/06/24 Stress Test - denies ECHO - 12/18/23 Cardiac Cath - denies  Sleep Study - denies CPAP -   Fasting Blood Sugar - na Checks Blood Sugar _____ times a day  Blood Thinner Instructions:per Dr. Hughes, Plavix  does not need to be held.  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   Patient denies shortness of breath, fever, cough and chest pain over the phone call  Special instructions:    Oral Hygiene is also important to reduce your risk of infection.  Remember - BRUSH YOUR TEETH THE MORNING OF SURGERY WITH YOUR REGULAR TOOTHPASTE

## 2024-05-01 LAB — LAB REPORT - SCANNED
Free T4: 1.58 ng/dL
TSH: 0.55 (ref 0.41–5.90)

## 2024-05-02 ENCOUNTER — Encounter (HOSPITAL_COMMUNITY): Payer: Self-pay | Admitting: Interventional Radiology

## 2024-05-03 ENCOUNTER — Observation Stay (HOSPITAL_COMMUNITY)
Admission: RE | Admit: 2024-05-03 | Discharge: 2024-05-04 | Disposition: A | Discharge status: Home / Self Care | Attending: Internal Medicine | Admitting: Internal Medicine

## 2024-05-03 ENCOUNTER — Other Ambulatory Visit: Payer: Self-pay

## 2024-05-03 ENCOUNTER — Ambulatory Visit (HOSPITAL_COMMUNITY)
Admission: RE | Admit: 2024-05-03 | Discharge: 2024-05-03 | Disposition: A | Discharge status: Home / Self Care | Source: Ambulatory Visit | Attending: Student | Admitting: Student

## 2024-05-03 ENCOUNTER — Ambulatory Visit (HOSPITAL_COMMUNITY): Payer: Self-pay | Admitting: Anesthesiology

## 2024-05-03 ENCOUNTER — Encounter (HOSPITAL_COMMUNITY): Admission: RE | Disposition: A | Discharge status: Home / Self Care | Payer: Self-pay | Source: Home / Self Care

## 2024-05-03 ENCOUNTER — Encounter (HOSPITAL_COMMUNITY): Payer: Self-pay | Admitting: Interventional Radiology

## 2024-05-03 ENCOUNTER — Encounter (HOSPITAL_COMMUNITY): Payer: Self-pay

## 2024-05-03 ENCOUNTER — Ambulatory Visit (HOSPITAL_COMMUNITY)

## 2024-05-03 VITALS — BP 122/66 | HR 56 | Temp 98.2°F | Resp 18 | Ht 68.0 in

## 2024-05-03 DIAGNOSIS — I280 Arteriovenous fistula of pulmonary vessels: Principal | ICD-10-CM | POA: Insufficient documentation

## 2024-05-03 DIAGNOSIS — Z4889 Encounter for other specified surgical aftercare: Secondary | ICD-10-CM | POA: Insufficient documentation

## 2024-05-03 DIAGNOSIS — Z9889 Other specified postprocedural states: Secondary | ICD-10-CM

## 2024-05-03 DIAGNOSIS — E785 Hyperlipidemia, unspecified: Secondary | ICD-10-CM | POA: Insufficient documentation

## 2024-05-03 DIAGNOSIS — Q2572 Congenital pulmonary arteriovenous malformation: Secondary | ICD-10-CM | POA: Insufficient documentation

## 2024-05-03 DIAGNOSIS — F17210 Nicotine dependence, cigarettes, uncomplicated: Secondary | ICD-10-CM | POA: Insufficient documentation

## 2024-05-03 DIAGNOSIS — Z8701 Personal history of pneumonia (recurrent): Secondary | ICD-10-CM | POA: Insufficient documentation

## 2024-05-03 DIAGNOSIS — J439 Emphysema, unspecified: Secondary | ICD-10-CM | POA: Insufficient documentation

## 2024-05-03 DIAGNOSIS — Z01812 Encounter for preprocedural laboratory examination: Secondary | ICD-10-CM

## 2024-05-03 DIAGNOSIS — I739 Peripheral vascular disease, unspecified: Secondary | ICD-10-CM | POA: Diagnosis not present

## 2024-05-03 DIAGNOSIS — Z7902 Long term (current) use of antithrombotics/antiplatelets: Secondary | ICD-10-CM | POA: Insufficient documentation

## 2024-05-03 DIAGNOSIS — I6522 Occlusion and stenosis of left carotid artery: Secondary | ICD-10-CM | POA: Insufficient documentation

## 2024-05-03 DIAGNOSIS — R042 Hemoptysis: Secondary | ICD-10-CM

## 2024-05-03 DIAGNOSIS — E0510 Thyrotoxicosis with toxic single thyroid nodule without thyrotoxic crisis or storm: Secondary | ICD-10-CM | POA: Diagnosis not present

## 2024-05-03 DIAGNOSIS — Z79899 Other long term (current) drug therapy: Secondary | ICD-10-CM | POA: Diagnosis not present

## 2024-05-03 DIAGNOSIS — I10 Essential (primary) hypertension: Secondary | ICD-10-CM | POA: Diagnosis not present

## 2024-05-03 DIAGNOSIS — Q2730 Arteriovenous malformation, site unspecified: Secondary | ICD-10-CM

## 2024-05-03 HISTORY — PX: IR ANGIOGRAM PULMONARY LEFT SELECTIVE: IMG662

## 2024-05-03 HISTORY — PX: IR EMBO ART  VEN HEMORR LYMPH EXTRAV  INC GUIDE ROADMAPPING: IMG5450

## 2024-05-03 HISTORY — PX: RADIOLOGY WITH ANESTHESIA: SHX6223

## 2024-05-03 HISTORY — DX: Nonrheumatic aortic (valve) stenosis: I35.0

## 2024-05-03 HISTORY — DX: Congenital pulmonary arteriovenous malformation: Q25.72

## 2024-05-03 HISTORY — PX: IR US GUIDE VASC ACCESS RIGHT: IMG2390

## 2024-05-03 LAB — CBC
HCT: 43.6 % (ref 39.0–52.0)
Hemoglobin: 14.6 g/dL (ref 13.0–17.0)
MCH: 33.3 pg (ref 26.0–34.0)
MCHC: 33.5 g/dL (ref 30.0–36.0)
MCV: 99.5 fL (ref 80.0–100.0)
Platelets: 225 K/uL (ref 150–400)
RBC: 4.38 MIL/uL (ref 4.22–5.81)
RDW: 12.6 % (ref 11.5–15.5)
WBC: 7.5 K/uL (ref 4.0–10.5)
nRBC: 0 % (ref 0.0–0.2)

## 2024-05-03 LAB — BASIC METABOLIC PANEL WITH GFR
Anion gap: 11 (ref 5–15)
BUN: 8 mg/dL (ref 6–20)
CO2: 23 mmol/L (ref 22–32)
Calcium: 9.2 mg/dL (ref 8.9–10.3)
Chloride: 105 mmol/L (ref 98–111)
Creatinine, Ser: 0.71 mg/dL (ref 0.61–1.24)
GFR, Estimated: >60 mL/min
Glucose, Bld: 97 mg/dL (ref 70–99)
Potassium: 4 mmol/L (ref 3.5–5.1)
Sodium: 139 mmol/L (ref 135–145)

## 2024-05-03 LAB — PROTIME-INR
INR: 1 (ref 0.8–1.2)
Prothrombin Time: 13.7 s (ref 11.4–15.2)

## 2024-05-03 SURGERY — RADIOLOGY WITH ANESTHESIA
Anesthesia: General | Laterality: Left

## 2024-05-03 MED ORDER — IOHEXOL 300 MG/ML  SOLN
100.0000 mL | Freq: Once | INTRAMUSCULAR | Status: AC | PRN
  Administered 2024-05-03: 48 mL via INTRA_ARTERIAL

## 2024-05-03 MED ORDER — NICOTINE 14 MG/24HR TD PT24
14.0000 mg | MEDICATED_PATCH | Freq: Every day | TRANSDERMAL | Status: DC
  Administered 2024-05-03: 14 mg via TRANSDERMAL
  Filled 2024-05-03: qty 1

## 2024-05-03 MED ORDER — SUGAMMADEX SODIUM 200 MG/2ML IV SOLN
INTRAVENOUS | Status: DC | PRN
  Administered 2024-05-03: 200 mg via INTRAVENOUS

## 2024-05-03 MED ORDER — DORZOLAMIDE HCL-TIMOLOL MAL 2-0.5 % OP SOLN
1.0000 [drp] | Freq: Two times a day (BID) | OPHTHALMIC | Status: DC
  Administered 2024-05-03 – 2024-05-04 (×3): 1 [drp] via OPHTHALMIC
  Filled 2024-05-03: qty 10

## 2024-05-03 MED ORDER — ROCURONIUM BROMIDE 10 MG/ML (PF) SYRINGE
PREFILLED_SYRINGE | INTRAVENOUS | Status: DC | PRN
  Administered 2024-05-03: 60 mg via INTRAVENOUS
  Administered 2024-05-03: 40 mg via INTRAVENOUS

## 2024-05-03 MED ORDER — HYDROMORPHONE HCL 1 MG/ML IJ SOLN: 0.2500 mg | INTRAMUSCULAR | Status: DC | PRN

## 2024-05-03 MED ORDER — LIDOCAINE HCL 1 % IJ SOLN
20.0000 mL | Freq: Once | INTRAMUSCULAR | Status: AC
  Administered 2024-05-03: 10 mL

## 2024-05-03 MED ORDER — ORAL CARE MOUTH RINSE: 15.0000 mL | Freq: Once | OROMUCOSAL | Status: AC

## 2024-05-03 MED ORDER — CEFAZOLIN SODIUM-DEXTROSE 2-4 GM/100ML-% IV SOLN
INTRAVENOUS | Status: AC
  Filled 2024-05-03: qty 100

## 2024-05-03 MED ORDER — ONDANSETRON HCL 4 MG/2ML IJ SOLN
INTRAMUSCULAR | Status: DC | PRN
  Administered 2024-05-03: 4 mg via INTRAVENOUS

## 2024-05-03 MED ORDER — ACETAMINOPHEN 650 MG RE SUPP: 650.0000 mg | Freq: Four times a day (QID) | RECTAL | Status: DC | PRN

## 2024-05-03 MED ORDER — METHIMAZOLE 2.5 MG HALF TABLET
2.5000 mg | ORAL_TABLET | Freq: Every day | ORAL | Status: DC
  Administered 2024-05-03: 2.5 mg via ORAL
  Filled 2024-05-03 (×3): qty 1

## 2024-05-03 MED ORDER — CLOPIDOGREL BISULFATE 75 MG PO TABS
75.0000 mg | ORAL_TABLET | Freq: Every day | ORAL | Status: DC
  Administered 2024-05-04: 75 mg via ORAL
  Filled 2024-05-03: qty 1

## 2024-05-03 MED ORDER — FENTANYL CITRATE (PF) 100 MCG/2ML IJ SOLN
INTRAMUSCULAR | Status: AC
  Filled 2024-05-03: qty 2

## 2024-05-03 MED ORDER — CEFAZOLIN SODIUM-DEXTROSE 2-3 GM-%(50ML) IV SOLR
INTRAVENOUS | Status: DC | PRN
  Administered 2024-05-03: 2 g via INTRAVENOUS

## 2024-05-03 MED ORDER — TAMSULOSIN HCL 0.4 MG PO CAPS
0.4000 mg | ORAL_CAPSULE | Freq: Every day | ORAL | Status: DC
  Administered 2024-05-03: 0.4 mg via ORAL
  Filled 2024-05-03: qty 1

## 2024-05-03 MED ORDER — LACTATED RINGERS IV SOLN: INTRAVENOUS | Status: DC | PRN

## 2024-05-03 MED ORDER — SENNA 8.6 MG PO TABS
1.0000 | ORAL_TABLET | Freq: Two times a day (BID) | ORAL | Status: DC
  Administered 2024-05-03 – 2024-05-04 (×2): 8.6 mg via ORAL
  Filled 2024-05-03 (×2): qty 1

## 2024-05-03 MED ORDER — DEXAMETHASONE SOD PHOSPHATE PF 10 MG/ML IJ SOLN
INTRAMUSCULAR | Status: DC | PRN
  Administered 2024-05-03: 5 mg via INTRAVENOUS

## 2024-05-03 MED ORDER — OXYCODONE HCL 5 MG/5ML PO SOLN: 5.0000 mg | Freq: Once | ORAL | Status: DC | PRN

## 2024-05-03 MED ORDER — ENOXAPARIN SODIUM 40 MG/0.4ML IJ SOSY
40.0000 mg | PREFILLED_SYRINGE | INTRAMUSCULAR | Status: DC
  Administered 2024-05-04: 40 mg via SUBCUTANEOUS
  Filled 2024-05-03: qty 0.4

## 2024-05-03 MED ORDER — IOHEXOL 300 MG/ML  SOLN
100.0000 mL | Freq: Once | INTRAMUSCULAR | Status: AC | PRN
  Administered 2024-05-03: 40 mL via INTRA_ARTERIAL

## 2024-05-03 MED ORDER — SODIUM CHLORIDE 0.9 % IV SOLN: INTRAVENOUS | Status: DC

## 2024-05-03 MED ORDER — LIDOCAINE HCL 1 % IJ SOLN
INTRAMUSCULAR | Status: AC
  Filled 2024-05-03: qty 20

## 2024-05-03 MED ORDER — PROPOFOL 10 MG/ML IV BOLUS
INTRAVENOUS | Status: DC | PRN
  Administered 2024-05-03: 120 mg via INTRAVENOUS

## 2024-05-03 MED ORDER — MIDAZOLAM HCL (PF) 2 MG/2ML IJ SOLN
INTRAMUSCULAR | Status: DC | PRN
  Administered 2024-05-03: 2 mg via INTRAVENOUS

## 2024-05-03 MED ORDER — OXYCODONE HCL 5 MG PO TABS: 5.0000 mg | ORAL_TABLET | Freq: Once | ORAL | Status: DC | PRN

## 2024-05-03 MED ORDER — BRIMONIDINE TARTRATE 0.2 % OP SOLN
1.0000 [drp] | Freq: Three times a day (TID) | OPHTHALMIC | Status: DC
  Administered 2024-05-03 – 2024-05-04 (×2): 1 [drp] via OPHTHALMIC
  Filled 2024-05-03: qty 5

## 2024-05-03 MED ORDER — PHENYLEPHRINE HCL-NACL 20-0.9 MG/250ML-% IV SOLN
INTRAVENOUS | Status: DC | PRN
  Administered 2024-05-03: 55 ug/min via INTRAVENOUS

## 2024-05-03 MED ORDER — LATANOPROST 0.005 % OP SOLN
1.0000 [drp] | Freq: Every day | OPHTHALMIC | Status: DC
  Administered 2024-05-03: 1 [drp] via OPHTHALMIC
  Filled 2024-05-03 (×2): qty 2.5

## 2024-05-03 MED ORDER — CHLORHEXIDINE GLUCONATE 0.12 % MT SOLN
15.0000 mL | Freq: Once | OROMUCOSAL | Status: AC
  Administered 2024-05-03: 15 mL via OROMUCOSAL
  Filled 2024-05-03: qty 15

## 2024-05-03 MED ORDER — FENTANYL CITRATE (PF) 100 MCG/2ML IJ SOLN
INTRAMUSCULAR | Status: DC | PRN
  Administered 2024-05-03: 100 ug via INTRAVENOUS

## 2024-05-03 MED ORDER — ROSUVASTATIN CALCIUM 20 MG PO TABS
40.0000 mg | ORAL_TABLET | Freq: Every day | ORAL | Status: DC
  Administered 2024-05-03 – 2024-05-04 (×2): 40 mg via ORAL
  Filled 2024-05-03 (×2): qty 2

## 2024-05-03 MED ORDER — MIDAZOLAM HCL 2 MG/2ML IJ SOLN
INTRAMUSCULAR | Status: AC
  Filled 2024-05-03: qty 2

## 2024-05-03 MED ORDER — DROPERIDOL 2.5 MG/ML IJ SOLN: 0.6250 mg | Freq: Once | INTRAMUSCULAR | Status: DC | PRN

## 2024-05-03 MED ORDER — ACETAMINOPHEN 325 MG PO TABS: 650.0000 mg | ORAL_TABLET | Freq: Four times a day (QID) | ORAL | Status: DC | PRN

## 2024-05-03 MED ORDER — ONDANSETRON HCL 4 MG/2ML IJ SOLN: 4.0000 mg | Freq: Once | INTRAMUSCULAR | Status: DC | PRN

## 2024-05-03 NOTE — H&P (Signed)
 " Date: 05/03/2024               Patient Name:  Terry Gross MRN: [id number]  DOB: [birth year] Age / Sex: 58 y.o., male   PCP: Bulah Alm RAMAN, PA-C         Medical Service: Internal Medicine Teaching Service         Attending Physician: Dr. Shawn Sick, MD      First Contact 24/7: Dr. Alan Maiden Pager:  [phone]  Second Contact 24/7: Dr. Toma Edwards, DO Pager:  [phone]   SUBJECTIVE   Chief Complaint: Post-procedure observation overnight  History of Present Illness: Terry Gross is a 58 y.o. 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  The patient was recently hospitalized on 12/10 after an episode of hemoptysis, which resolved spontaneously within ~30 minutes. Given his known LLL pulmonary AVM and absence of infectious symptoms, the hemoptysis was felt to be secondary to AVM hemorrhage. Pulmonology and Interventional Radiology were consulted at that time. A repeat CTA chest demonstrated a persistent LLL pulmonary AVM, overall unchanged in size; however, given his recurrent pneumonia and hemoptysis, further evaluation and definitive treatment were recommended. He was discharged on 12/12 with stable hemoglobin (13.7 g/dL) and scheduled for pulmonary arteriography with embolization, which was completed today.  Post-procedure, the patient was found resting comfortably in the PACU. He denies fevers, chills, chest pain, shortness of breath, nausea, vomiting, dizziness, fatigue, or recent illness. He reports feeling at his baseline and presented for the procedure today in his usual state of health.  Procedure Note, per Dr.  Hughes: Findings:  - Vascular access obtained via the right femoral vein - Approximately 1 cm left lower lobe pulmonary AVM with multiple feeding arteries identified - Successful plug-assisted embolization using Medtronic microplugs (3Q 2, 5Q, 7Q) - Successful exclusion of the AVM without residual opacification - No additional pulmonary fistulas identified   Past Medical History Past Medical History:  Diagnosis Date   Aortic atherosclerosis    Aortic stenosis    Aortic valve insufficiency 12/15/2014   Moderate by ECHO 12/15/14 normal LV function    Back pain    Carotid artery occlusion    Cataract    Hyperlipidemia    Hypertension    Impaired fasting blood sugar    Left epiretinal membrane 09/12/2019   This condition is resolved after vitrectomy and membrane peel was performed, 02/06/2019.  Topographic distortion completely released.   Loss of balance    Peripheral vascular disease    Pulmonary arteriovenous malformation    Smoker    Vitreous hemorrhage of left eye (HCC) 06/10/2019     Meds:  No current facility-administered medications on file prior to encounter.   Current Outpatient Medications on File Prior to Encounter  Medication Sig   brimonidine  (ALPHAGAN ) 0.2 % ophthalmic solution Place 1 drop into the left eye 3 (three) times daily.   clopidogrel  (PLAVIX ) 75 MG tablet Take 1 tablet (75 mg total) by mouth daily.   dorzolamide -timolol  (COSOPT ) 2-0.5 % ophthalmic solution Place 1 drop into the left eye 2 (two) times daily.   ferrous gluconate  (FERGON) 324 MG tablet Take 1 tablet (324 mg total) by mouth 2 (two) times daily with  a meal. (Patient taking differently: Take 324 mg by mouth daily.)   latanoprost  (XALATAN ) 0.005 % ophthalmic solution INSTILL 1 DROP IN LEFT EYE NIGHTLY (Patient taking differently: Place 1 drop into both eyes at bedtime.)   methimazole  (TAPAZOLE ) 5 MG tablet Take 2.5 mg by mouth daily.   Omega-3 Fatty Acids (FISH OIL) 1200 MG CAPS Take 1,200 mg  by mouth daily.   rosuvastatin  (CRESTOR ) 40 MG tablet Take 1 tablet (40 mg total) by mouth daily.   tamsulosin  (FLOMAX ) 0.4 MG CAPS capsule Take 1 capsule (0.4 mg total) by mouth daily after supper.   nicotine  (NICODERM CQ  - DOSED IN MG/24 HOURS) 14 mg/24hr patch Place 1 patch (14 mg total) onto the skin daily. (Patient not taking: Reported on 04/18/2024)   Varenicline  Tartrate, Starter, (CHANTIX  STARTING MONTH PAK) 0.5 MG X 11 & 1 MG X 42 TBPK Take 1 tablet by mouth daily. (Patient not taking: Reported on 04/18/2024)    Past Surgical History Past Surgical History:  Procedure Laterality Date   CATARACT EXTRACTION     COLONOSCOPY  07/2020   never    Social:  Lives with wife. Occupation: Sometimes Lawyer: Independent ADLs and iADLs Level of Function:  Independent ADLs and iADLs PCP: Bulah Alm RAMAN, PA-C Substances:  Smokes 1-2 cigs per day, denies all other substance use  Family History:  None pertinent  Allergies: Allergies as of 04/22/2024   (No Known Allergies)    Review of Systems: A complete ROS was negative except as per HPI.   OBJECTIVE:   Physical Exam: There were no vitals taken for this visit.  Constitutional: In no acute distress. HENT: Normocephalic, atraumatic,  Eyes: Sclera non-icteric, PERRL, EOM intact Neck:normal atraumatic, no neck masses, normal thyroid , no jvd Cardio:Regular rate and rhythm. No murmurs, rubs, or gallops. 2+ bilateral dorsalis pedis pulses. Pulm:Clear to auscultation bilaterally. Normal work of breathing on room air. Abdomen: Soft, non-tender, non-distended, positive bowel sounds. FDX:Wzhjupcz for extremity edema. Skin:Warm and dry. Neuro:Alert and oriented x3. No focal deficit noted. Psych:Pleasant mood and affect.  Labs: CBC    Component Value Date/Time   WBC 7.5 05/03/2024 0626   RBC 4.38 05/03/2024 0626   HGB 14.6 05/03/2024 0626   HGB 14.7 12/27/2023 1011   HCT 43.6 05/03/2024 0626   HCT 45.4 12/27/2023 1011    PLT 225 05/03/2024 0626   PLT 164 12/27/2023 1011   MCV 99.5 05/03/2024 0626   MCV 96 12/27/2023 1011   MCH 33.3 05/03/2024 0626   MCHC 33.5 05/03/2024 0626   RDW 12.6 05/03/2024 0626   RDW 16.0 (H) 12/27/2023 1011   LYMPHSABS 2.1 12/27/2023 1011   MONOABS 0.6 09/17/2023 0019   EOSABS 0.1 12/27/2023 1011   BASOSABS 0.0 12/27/2023 1011     CMP     Component Value Date/Time   NA 139 05/03/2024 0626   NA 140 12/27/2023 1011   K 4.0 05/03/2024 0626   CL 105 05/03/2024 0626   CO2 23 05/03/2024 0626   GLUCOSE 97 05/03/2024 0626   BUN 8 05/03/2024 0626   BUN 8 12/27/2023 1011   CREATININE 0.71 05/03/2024 0626   CREATININE 0.75 10/31/2016 0840   CALCIUM  9.2 05/03/2024 0626   PROT 7.0 04/17/2024 2333   PROT 6.9 11/22/2022 1529   ALBUMIN 3.6 04/17/2024 2333   ALBUMIN 4.2 11/22/2022 1529   AST 17 04/17/2024 2333   ALT 16 04/17/2024 2333   ALKPHOS 58 04/17/2024 2333   BILITOT 0.6 04/17/2024 2333  BILITOT 0.3 11/22/2022 1529   GFRNONAA >60 05/03/2024 0626   GFRAA 126 08/22/2019 0847    Imaging: No results found.   ASSESSMENT & PLAN:   Assessment & Plan by Problem: Principal Problem:   Observation after surgery   Juandaniel Manfredo is a 58 y.o. male with pertinent PMHx of toxic thyroid  nodule, left internal carotid artery occlusion, aortic stenosis, PAD, emphysema, and a known left lower lobe pulmonary arteriovenous malformation, who is admitted for overnight observation following successful pulmonary arteriography with embolization of the left lower lobe AVM.  #Pulmonary AVM s/p Embolization #Post-procedure Observation S/p successful embolization of a LLL pulmonary AVM, performed due to prior hemoptysis and recurrent pulmonary infections. The procedure was uncomplicated, with complete exclusion of the AVM and no residual opacification noted. He is currently asymptomatic and hemodynamically stable - Admit to IMTS, attending Dr. Shawn, for overnight observation - Med-Surg,  Vital signs per floor - IR consulted, appreciate recommendations  - Bedrest with RLE straight x2hrs. No HOB restriction.   - Post op CXR.  - Neuro and neurovascular checks q4Hrs. - Resume diet as tolerated - Fluids: IV 0.9% NaCl infusion 40mL/hr - Pain control:  - Bowel Reg: Senna tablet 8.6 mg twice daily - AM Labs: BMP, CBC - Anticipate DC in AM if uneventful.  #Hemoptysis (Resolved) No recurrent hemoptysis post-procedure - Continue to monitor overnight  #Chronic Medical Conditions PAD: Start home clopidogrel  75 mg per IR, rosuvastatin  40 mg daily HLD: As above Toxic thyroid  nodule: Start home methimazole  2.5 mg daily Emphysema: Monitor respiratory status, supplemental oxygen PRN   Diet: Normal VTE: Enoxaparin  IVF: NS,45mL/hr Code: Full  Dispo: Admit patient to Observation with expected length of stay less than 2 midnights.  Signed: Elodie Palma, MD Internal Medicine  Psychiatry Resident PGY-1  05/03/2024, 1:06 PM   Dr. Palma Elodie, MD Pager: [phone] "

## 2024-05-03 NOTE — Procedures (Addendum)
 Vascular and Interventional Radiology Procedure Note  Patient: Terry Gross DOB: 26-Mar-1966 Medical Record Number: [id number] Note Date/Time: 05/03/2024 9:04 AM   Performing Physician: Thom Hall, MD Assistant(s): None  Diagnosis: LLL PAVM   Procedure:  PULMONARY ARTERIOGRAPHY EMBOLIZATION of LEFT LOWER LOBE PULMONARY ARTERIOVENOUS MALFORMATION   Anesthesia: General Anesthesia Complications: None Estimated Blood Loss: Minimal Specimens: None   Findings:  - access via the RIGHT femoral vein. - ~1cm LLL PAVM with multiple feeding arteries - Successful plug-assisted embolization with Medtronic 3Q X2, 5Q, 7Q microplugs - Successful exclusion without residual opacification. - No additional pulmonary fistula was appreciated. - Pulmonary arterial pressures not obtained.   Plan: - Admit to Medicine service for overnight observation, in Med Surg unit. - Bedrest with RLE straight x2hrs. No HOB restriction.  - Post op CXR. - Neuro and neurovascular checks q4Hrs. - No diet restriction from VIR perspective. Advance diet as tolerated.  - Anticipate DC in AM if uneventful.  Final report to follow once all images are reviewed and compared with previous studies.  See detailed dictation with images in PACS. The patient tolerated the procedure well without incident or complication and was returned to PACU in stable condition.    Thom Hall, MD [facility] Radiology   Pager. [phone] Clinic. [phone]

## 2024-05-03 NOTE — Anesthesia Procedure Notes (Signed)
 Arterial Line Insertion Start/End12/26/2025 7:10 AM, 05/03/2024 7:15 AM Performed by: Jerrye Sharper, MD, Mannie Krystal LABOR, CRNA, CRNA  Patient location: Pre-op. Preanesthetic checklist: patient identified, IV checked, site marked, risks and benefits discussed, surgical consent, monitors and equipment checked, pre-op evaluation, timeout performed and anesthesia consent Lidocaine  1% used for infiltration Right, radial was placed Catheter size: 20 G Hand hygiene performed , maximum sterile barriers used  and Seldinger technique used Allen's test indicative of satisfactory collateral circulation Attempts: 1 Procedure performed using ultrasound to evaluate access site. Ultrasound Notes:relevant anatomy identified, ultrasound used to visualize needle entry and vessel patent under ultrasound. Following insertion, Biopatch. Post procedure assessment: normal  Patient tolerated the procedure well with no immediate complications.

## 2024-05-03 NOTE — H&P (Signed)
 "     Chief Complaint: Patient was seen in consultation today for pulmonary arteriovenous malformation  Referring Physician(s): Dr. Toribio Sharps  Supervising Physician: Hughes Simmonds  Patient Status: [facility] - Out-pt  History of Present Illness: Terry Gross is a 58 y.o. male with PMHs of HTN, aortic insufficiency, HLD, CAD, L ICA occlusion, and pulmonary AVM, IR was consulted for possible intervention.  As recently documented during admission 04/18/24:  Patient has history of smoking cigarette, doppler study on 09/24/18 showed total occlusion of L ICA and partial occlusion of R ICA, he s being evaluated with annual bilateral carotid duplex which has been showing progression, most recent duplex showed 40-59% stenosis. Patient has been taking Plavix . Patient developed cough with intermittent hemoptysis and underwent CTA chest on 08/20/23 which showed LLL AVM. Patient has been followed by pulmonary.  Since then patient had another episode of hemoptysis in May 2025 and yesterday 04/17/24. In ED he was hemodynamically stable and hemoptysis resolved spontaneously. PCCM was consulted by EDP, IR was consulted for possible pulmonary angiogram with intervention.  At that time, repeat CTA chest showed LLL AVM, and although overall unchanged in size, his recurrent pneumonia and hemoptysis have prompted further evaluation with intent to treat.  He presents today in his usual state of health.  He has been NPO.  He is accompanied by family today.  All are aware of the goals, risks, and benefits.   He is FULL CODE.   Past Medical History:  Diagnosis Date   Aortic atherosclerosis    Aortic stenosis    Aortic valve insufficiency 12/15/2014   Moderate by ECHO 12/15/14 normal LV function    Back pain    Carotid artery occlusion    Cataract    Hyperlipidemia    Hypertension    Impaired fasting blood sugar    Left epiretinal membrane 09/12/2019   This condition is resolved after vitrectomy and membrane  peel was performed, 02/06/2019.  Topographic distortion completely released.   Loss of balance    Peripheral vascular disease    Pulmonary arteriovenous malformation    Smoker    Vitreous hemorrhage of left eye (HCC) 06/10/2019    Past Surgical History:  Procedure Laterality Date   CATARACT EXTRACTION     COLONOSCOPY  07/2020   never    Allergies: Patient has no known allergies.  Medications: Prior to Admission medications  Medication Sig Start Date End Date Taking? Authorizing Provider  brimonidine  (ALPHAGAN ) 0.2 % ophthalmic solution Place 1 drop into the left eye 3 (three) times daily. 06/27/23   [provider]  clopidogrel  (PLAVIX ) 75 MG tablet Take 1 tablet (75 mg total) by mouth daily. 12/28/23   Tysinger, Alm RAMAN, PA-C  dorzolamide -timolol  (COSOPT ) 2-0.5 % ophthalmic solution Place 1 drop into the left eye 2 (two) times daily. 04/08/22   [provider]  ferrous gluconate  (FERGON) 324 MG tablet Take 1 tablet (324 mg total) by mouth 2 (two) times daily with a meal. Patient taking differently: Take 324 mg by mouth daily. 12/28/23   Tysinger, Alm RAMAN, PA-C  latanoprost  (XALATAN ) 0.005 % ophthalmic solution INSTILL 1 DROP IN LEFT EYE NIGHTLY Patient taking differently: Place 1 drop into both eyes at bedtime. 04/06/21   Rankin, Arley LABOR, MD  methimazole  (TAPAZOLE ) 5 MG tablet Take 2.5 mg by mouth daily. 03/20/24   [provider]  nicotine  (NICODERM CQ  - DOSED IN MG/24 HOURS) 14 mg/24hr patch Place 1 patch (14 mg total) onto the skin daily. Patient  not taking: Reported on 04/18/2024 12/07/23   Mannam, Praveen, MD  Omega-3 Fatty Acids (FISH OIL) 1200 MG CAPS Take 1,200 mg by mouth daily.    [provider]  rosuvastatin  (CRESTOR ) 40 MG tablet Take 1 tablet (40 mg total) by mouth daily. 06/05/23   Tysinger, Alm RAMAN, PA-C  tamsulosin  (FLOMAX ) 0.4 MG CAPS capsule Take 1 capsule (0.4 mg total) by mouth daily after supper. 06/05/23   Tysinger, Alm RAMAN, PA-C   Varenicline  Tartrate, Starter, (CHANTIX  STARTING MONTH PAK) 0.5 MG X 11 & 1 MG X 42 TBPK Take 1 tablet by mouth daily. Patient not taking: Reported on 04/18/2024 12/07/23   Mannam, Praveen, MD     Family History  Problem Relation Age of Onset   CVA Mother    Other Mother        brain tumor   Other Father        complications from prostate sugery   Cancer Father        prostate   Thyroid  disease Neg Hx    Heart disease Neg Hx    Diabetes Neg Hx    Kidney disease Neg Hx    Hypertension Neg Hx    Hyperlipidemia Neg Hx     Social History   Socioeconomic History   Marital status: Married    Spouse name: Not on file   Number of children: 0   Years of education: Not on file   Highest education level: Not on file  Occupational History   Not on file  Tobacco Use   Smoking status: Every Day    Current packs/day: 0.25    Average packs/day: 0.3 packs/day for 32.0 years (8.0 ttl pk-yrs)    Types: Cigarettes    Passive exposure: Never   Smokeless tobacco: Never   Tobacco comments:    4-5 cig a day  Vaping Use   Vaping status: Never Used  Substance and Sexual Activity   Alcohol use: No   Drug use: No   Sexual activity: Not on file  Other Topics Concern   Not on file  Social History Narrative   Married, no children.  Exercise some with calisthenics, walking.    From [place] originally.   11/2022   Social Drivers of Health   Tobacco Use: High Risk (05/03/2024)   Patient History    Smoking Tobacco Use: Every Day    Smokeless Tobacco Use: Never    Passive Exposure: Never  Financial Resource Strain: Not on file  Food Insecurity: Food Insecurity Present (04/18/2024)   Epic    Worried About Programme Researcher, Broadcasting/film/video in the Last Year: Sometimes true    [employer] of Food in the Last Year: Sometimes true  Transportation Needs: No Transportation Needs (04/18/2024)   Epic    Lack of Transportation (Medical): No    Lack of Transportation (Non-Medical): No  Physical Activity: Not on  file  Stress: Not on file  Social Connections: Not on file  Depression (EYV7-0): Low Risk (12/27/2023)   Depression (PHQ2-9)    PHQ-2 Score: 0  Alcohol Screen: Not on file  Housing: Low Risk (04/18/2024)   Epic    Unable to Pay for Housing in the Last Year: No    Number of Times Moved in the Last Year: 0    Homeless in the Last Year: No  Utilities: Not At Risk (04/18/2024)   Epic    Threatened with loss of utilities: No  Health Literacy: Not on file  Review of Systems: A 12 point ROS discussed and pertinent positives are indicated in the HPI above.  All other systems are negative.  Review of Systems  Constitutional:  Negative for fatigue and fever.  Respiratory:  Negative for cough and shortness of breath.   Cardiovascular:  Negative for chest pain.  Gastrointestinal:  Negative for abdominal pain.  Musculoskeletal:  Negative for back pain.  Psychiatric/Behavioral:  Negative for behavioral problems and confusion.     Vital Signs: BP 122/66   Pulse (!) 56   Temp 98.2 F (36.8 C) (Oral)   Resp 18   Ht 5' 8 (1.727 m)   SpO2 99%   BMI 21.79 kg/m   Physical Exam Vitals and nursing note reviewed.  Constitutional:      General: He is not in acute distress.    Appearance: Normal appearance. He is not ill-appearing.  Cardiovascular:     Rate and Rhythm: Normal rate and regular rhythm.  Pulmonary:     Effort: Pulmonary effort is normal. No respiratory distress.  Abdominal:     General: Abdomen is flat. There is no distension.  Skin:    General: Skin is warm and dry.  Neurological:     General: No focal deficit present.     Mental Status: He is alert and oriented to person, place, and time. Mental status is at baseline.  Psychiatric:        Mood and Affect: Mood normal.        Behavior: Behavior normal.        Thought Content: Thought content normal.        Judgment: Judgment normal.      MD Evaluation Airway: WNL Heart: WNL Abdomen: WNL Chest/ Lungs:  WNL ASA  Classification: 3 Mallampati/Airway Score: Two   Imaging: CT Angio Chest Pulmonary Embolism (PE) W or WO Contrast Result Date: 04/18/2024 EXAM: CTA CHEST 04/18/2024 01:24:09 PM TECHNIQUE: CTA of the chest was performed after the administration of 75 mL of iohexol  (OMNIPAQUE ) 350 MG/ML injection. Multiplanar reformatted images are provided for review. MIP images are provided for review. Automated exposure control, iterative reconstruction, and/or weight based adjustment of the mA/kV was utilized to reduce the radiation dose to as low as reasonably achievable. COMPARISON: 09/17/2023 and previous. CLINICAL HISTORY: Eval for LLL AVM. Hemoptysis, follow-up pulmonary AVM. FINDINGS: PULMONARY ARTERIES: Pulmonary arteries are adequately opacified for evaluation. No acute pulmonary embolus. Main pulmonary artery is normal in caliber. MEDIASTINUM: The heart and pericardium demonstrate no acute abnormality. Aortic leaflet coarse calcification. Scattered coronary calcifications. Scattered calcified plaque in the aortic arch, involving great vessel origins, without high-grade stenosis or occlusion. LYMPH NODES: No mediastinal, hilar or axillary lymphadenopathy. LUNGS AND PLEURA: AVM in the superior segment of the left lower lobe with 9 mm nidus, grossly stable from prior studies back to 08/20/23. There is, however, more airspace consolidation surrounding the lesion and much of the superior segment, to a lesser degree in the posterior and lateral basal segments of the left lower lobe. Pulmonary emphysema with innumerable subpleural blebs in the upper lobes. No evidence of pleural effusion or pneumothorax. UPPER ABDOMEN: Limited images of the upper abdomen are unremarkable. SOFT TISSUES AND BONES: No acute bone or soft tissue abnormality. IMPRESSION: 1. Stable left lower lobe pulmonary AVM with 9 mm nidus, with increased surrounding airspace consolidation, may represent interval hemorrhage or pneumonia. 2.  Pulmonary emphysema. Consider evaluation for a low-dose CT lung cancer screening program. 3. Aortic valve , scattered coronary , and  aortic  arch calcifications. Electronically signed by: Katheleen Faes MD 04/18/2024 02:15 PM EST RP Workstation: [id number]   DG Chest 2 View Result Date: 04/18/2024 CLINICAL DATA:  Cough and hemoptysis EXAM: CHEST - 2 VIEW COMPARISON:  09/17/2023 FINDINGS: Heterogeneous ground-glass disease in the left mid to lower lung. Normal cardiac size. No pleural effusion or pneumothorax. Emphysema IMPRESSION: Heterogeneous ground-glass disease in the left mid to lower lung, suspicious for pneumonia. Imaging follow-up to resolution is recommended. Emphysema and diffuse bronchitic changes Electronically Signed   By: Luke Bun M.D.   On: 04/18/2024 00:06    Labs:  CBC: Recent Labs    12/27/23 1011 04/17/24 2333 04/19/24 0530 05/03/24 0626  WBC 6.3 7.6 7.1 7.5  HGB 14.7 13.7 12.8* 14.6  HCT 45.4 41.7 37.3* 43.6  PLT 164 206 168 225    COAGS: Recent Labs    09/17/23 0019 05/03/24 0626  INR 1.0 1.0    BMP: Recent Labs    09/17/23 0019 12/27/23 1011 04/17/24 2333 04/19/24 0530 05/03/24 0626  NA 136 140 138 140 139  K 3.9 4.3 3.9 3.8 4.0  CL 105 105 105 107 105  CO2 24 21 25 27 23   GLUCOSE 109* 98 108* 99 97  BUN 16 8 9 12 8   CALCIUM  8.7* 8.9 8.7* 8.6* 9.2  CREATININE 0.54* 0.63* 0.54* 0.68 0.71  GFRNONAA >60  --  >60 >60 >60    LIVER FUNCTION TESTS: Recent Labs    09/17/23 0019 04/17/24 2333  BILITOT 0.6 0.6  AST 20 17  ALT 15 16  ALKPHOS 56 58  PROT 7.3 7.0  ALBUMIN 4.0 3.6    TUMOR MARKERS: No results for input(s): AFPTM, CEA, CA199, CHROMGRNA in the last 8760 hours.  Assessment and Plan: 58 year old male with 7-19mm superior segment LLL pAVM with recurrent bleeding and risk of hemorrhagic complication.  He has been evaluated by Dr. Hughes who has reviewed all available imaging and recommends proceeding with angiogram and  embolization of the LLL AVM.  Pt is agreeable to proceed based on a number of hemoptysis events in the past 1 year  The procedure has been fully reviewed with the patient/patients authorized representative. The risks, benefits and alternatives have been explained, and the patient/patients authorized representative has consented to the procedure.   Thank you for this interesting consult.  I greatly enjoyed meeting Kayron Hicklin and look forward to participating in their care.  A copy of this report was sent to the requesting provider on this date.  Electronically Signed: Danyia Borunda Sue-Ellen Wendel Homeyer, PA 05/03/2024, 8:06 AM   I spent a total of  30 Minutes   in face to face in clinical consultation, greater than 50% of which was counseling/coordinating care for pulmonary arteriovenous malformation.   "

## 2024-05-03 NOTE — Hospital Course (Addendum)
#  Pulmonary Arteriovenous Malformation (Left Lower Lobe) s/p Embolization The patient was admitted for overnight observation following successful pulmonary arteriography and embolization of a known left lower lobe pulmonary AVM. This procedure was pursued due to a recent episode of hemoptysis and recurrent pulmonary infections, thought to be secondary to AVM hemorrhage.  The procedure was uncomplicated, with successful plug-assisted embolization and complete exclusion of the AVM without residual opacification. Post-procedure, the patient remained hemodynamically stable and asymptomatic. He denied chest pain, shortness of breath, hemoptysis, dizziness, or fatigue. Oxygen saturation remained stable on room air, and there was no evidence of access-site bleeding or hematoma at the right femoral vein site. He was monitored overnight without complications.  #Hemoptysis (Resolved) The patient had a prior episode of hemoptysis that resolved spontaneously prior to this admission. There was no recurrence of hemoptysis during hospitalization following embolization.  Other chronic conditions were medically managed with home medications and formulary alternatives as necessary (PAD, HLD, Emphysema, Dry eyes).  12/27: was eating; denies any CP or SOB. NO bad pain, no nausea or vomiting. NO concern. Able to ambulat.

## 2024-05-03 NOTE — Progress Notes (Signed)
 Interventional Radiology Brief Note:  Patient assessed at bedside in PACU after AVM embolization.  He is alert, communicative. No cough, shortness of breath, or hemoptysis. Groin site soft, stable.  Dressing clean and dry.  Distal pulses palpable.  Admit overnight for observation.  Appreciate assistance from Internal Medicine team. Anticipate d/c home tomorrow pending uneventful evening.   Sheilah Rayos, MS RD PA-C 4:11 PM

## 2024-05-03 NOTE — Anesthesia Procedure Notes (Signed)
 Procedure Name: Intubation Date/Time: 05/03/2024 8:37 AM  Performed by: Mannie Krystal LABOR, CRNAPre-anesthesia Checklist: Patient identified, Emergency Drugs available, Suction available and Patient being monitored Patient Re-evaluated:Patient Re-evaluated prior to induction Oxygen Delivery Method: Circle system utilized Preoxygenation: Pre-oxygenation with 100% oxygen Induction Type: IV induction Ventilation: Mask ventilation without difficulty Laryngoscope Size: Mac and 4 Grade View: Grade I Tube type: Oral Tube size: 7.5 mm Number of attempts: 1 Airway Equipment and Method: Stylet Placement Confirmation: ETT inserted through vocal cords under direct vision, positive ETCO2 and breath sounds checked- equal and bilateral Secured at: 22 cm Tube secured with: Tape Dental Injury: Teeth and Oropharynx as per pre-operative assessment

## 2024-05-03 NOTE — Discharge Instructions (Addendum)
 Dear Terry Gross,  Thank you for allowing us  to care for you. You were admitted overnight for observation after undergoing a pulmonary arteriography with embolization of a pulmonary arteriovenous malformation (AVM) in your left lower lung. The procedure was successful, and you did not experience any complications during your hospital stay.  MEDICATION CHANGES No new long-term medications were started during this admission  POST-HOSPITAL & CARE INSTRUCTIONS Please call to arrange follow up with your primary care physician in 1 week.  Go to your follow up appointments (listed below) Please seek immediate medical care if you experience: - Coughing up blood - Chest pain or shortness of breath - Dizziness or fainting - Bleeding, swelling, redness, or increasing pain at the groin access site - Fever or signs of infection  DOCTOR'S APPOINTMENT   Future Appointments  Date Time Provider Department Center  08/19/2024  9:30 AM [facility] 4 [facility]-ECHO H&V    Take care and be well!  Internal Medicine Teaching Service Inpatient Team [place]  [facility]  [address] [place], [place] [address] [phone]

## 2024-05-03 NOTE — Anesthesia Postprocedure Evaluation (Signed)
"   Anesthesia Post Note  Patient: Environmental Consultant  Procedure(s) Performed: RADIOLOGY WITH ANESTHESIA (Left) IR ANGIOGRAM PULMONARY LEFT SELECTIVE IR US  GUIDE VASC ACCESS RIGHT IR EMBO VENOUS NOT HEMORR HEMANG  INC GUIDE ROADMAPPING     Patient location during evaluation: PACU Anesthesia Type: General Level of consciousness: awake and alert and oriented Pain management: pain level controlled Vital Signs Assessment: post-procedure vital signs reviewed and stable Respiratory status: spontaneous breathing, nonlabored ventilation and respiratory function stable Cardiovascular status: blood pressure returned to baseline and stable Postop Assessment: no apparent nausea or vomiting Anesthetic complications: no   No notable events documented.  Last Vitals:  Vitals:   05/03/24 1109 05/03/24 1115  BP: (!) 147/66 (!) 140/71  Pulse: 76 75  Resp: 14 15  Temp: 36.6 C   SpO2: 94% 96%    Last Pain:  Vitals:   05/03/24 1115  PainSc: 0-No pain                 Terry Loree A.      "

## 2024-05-03 NOTE — Transfer of Care (Signed)
 Immediate Anesthesia Transfer of Care Note  Patient: Terry Gross  Procedure(s) Performed: RADIOLOGY WITH ANESTHESIA (Left) IR ANGIOGRAM PULMONARY LEFT SELECTIVE IR US  GUIDE VASC ACCESS RIGHT IR EMBO VENOUS NOT HEMORR HEMANG  INC GUIDE ROADMAPPING  Patient Location: PACU  Anesthesia Type:General  Level of Consciousness: awake, alert , and oriented  Airway & Oxygen Therapy: Patient Spontanous Breathing and Patient connected to face mask oxygen  Post-op Assessment: Report given to RN and Post -op Vital signs reviewed and stable  Post vital signs: Reviewed and stable  Last Vitals:  Vitals Value Taken Time  BP 147/66 05/03/24 11:09  Temp    Pulse 70 05/03/24 11:13  Resp 18 05/03/24 11:13  SpO2 98 % 05/03/24 11:13  Vitals shown include unfiled device data.  Last Pain:  Vitals:   05/03/24 9367  PainSc: 0-No pain         Complications: No notable events documented.

## 2024-05-03 NOTE — Discharge Summary (Addendum)
 "  Name: Terry Gross MRN: [id number] DOB: [birth year] 58 y.o. PCP: Bulah Alm GORMAN DEVONNA  Date of Admission: 05/03/2024  5:12 AM Date of Discharge: 05/04/2024  Attending Physician: Dr. Mliss Pouch  Discharge Diagnosis: Principal Problem:   S/P embolization of pulmonary AVM Active Problems:   Arteriovenous malformation (AVM) of lung   Discharge Medications: Allergies as of 05/04/2024   No Known Allergies      Medication List     STOP taking these medications    nicotine  14 mg/24hr patch Commonly known as: NICODERM CQ  - dosed in mg/24 hours   Varenicline  Tartrate (Starter) 0.5 MG X 11 & 1 MG X 42 Tbpk Commonly known as: Chantix  Starting Month Pak       TAKE these medications    brimonidine  0.2 % ophthalmic solution Commonly known as: ALPHAGAN  Place 1 drop into the left eye 3 (three) times daily.   clopidogrel  75 MG tablet Commonly known as: PLAVIX  Take 1 tablet (75 mg total) by mouth daily.   dorzolamide -timolol  2-0.5 % ophthalmic solution Commonly known as: COSOPT  Place 1 drop into the left eye 2 (two) times daily.   ferrous gluconate  324 MG tablet Commonly known as: FERGON Take 1 tablet (324 mg total) by mouth 2 (two) times daily with a meal. What changed: when to take this   Fish Oil 1200 MG Caps Take 1,200 mg by mouth daily.   latanoprost  0.005 % ophthalmic solution Commonly known as: XALATAN  INSTILL 1 DROP IN LEFT EYE NIGHTLY What changed: See the new instructions.   methimazole  5 MG tablet Commonly known as: TAPAZOLE  Take 2.5 mg by mouth daily.   rosuvastatin  40 MG tablet Commonly known as: Crestor  Take 1 tablet (40 mg total) by mouth daily.   tamsulosin  0.4 MG Caps capsule Commonly known as: FLOMAX  Take 1 capsule (0.4 mg total) by mouth daily after supper.        Disposition and follow-up:   Terry Gross was discharged from [facility] in Stable condition.  At the hospital follow up visit please  address:  Ensure patient follows up with IR for follow up of embolization (IR team indicated on the date of discharge that they would contact patient with appt - he is discharging on a Saturday) Ensure smoking cessation (at last admission he had voiced desire to stop without use of medications, hence discontinuation of Chantix  from his med list)  Hospital Course by problem list: #Pulmonary Arteriovenous Malformation (Left Lower Lobe) s/p Embolization The patient was admitted for overnight observation following successful pulmonary arteriography and embolization of a known left lower lobe pulmonary AVM. This procedure was pursued due to a recent episode of hemoptysis and recurrent pulmonary infections, thought to be secondary to AVM hemorrhage.  The procedure was uncomplicated, with successful plug-assisted embolization and complete exclusion of the AVM without residual opacification. Post-procedure, the patient remained hemodynamically stable and asymptomatic. He denied chest pain, shortness of breath, hemoptysis, dizziness, or fatigue. Oxygen saturation remained stable on room air, and there was no evidence of access-site bleeding or hematoma at the right femoral vein site. He was monitored overnight without complications.  #Hemoptysis (Resolved) The patient had a prior episode of hemoptysis that resolved spontaneously prior to this admission. There was no recurrence of hemoptysis during hospitalization following embolization.  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  12/27: was eating; denies any CP  or SOB. NO bad pain, no nausea or vomiting. NO concern. Able to ambulat.     Discharge Subjective: Mr.Terry Gross denies any fevers, chills, SOB, chest pain, abdominal pain, or N&V.  Patient is medically ready for  discharge.  Discharge Exam:   BP (!) 106/56 (BP Location: Right Arm)   Pulse 71   Temp 98.6 F (37 C) (Oral)   Resp 16   SpO2 98%  Physical Exam: Constitutional: well-appearing, well-nourished, in no acute distress HENT: normocephalic atraumatic, mucous membranes moist Eyes: conjunctiva non-erythematous, PERRL, no scleral icterus Neck: supple without lesions Cardiovascular: regular rate and rhythm Pulmonary/Chest: normal work of breathing on room air, lungs CTAB Abdominal: soft, non-tender, non-distended, bowel sounds normal MSK: normal bulk and tone Neurological: alert & oriented x3 Skin: warm and dry Extremities: no edema or cyanosis; peripheral pulses intact Psych: normal mood and affect, thought content normal   Pertinent Labs, Studies, and Procedures:     Latest Ref Rng & Units 05/04/2024    6:49 AM 05/03/2024    6:26 AM 04/19/2024    5:30 AM  CBC  WBC 4.0 - 10.5 K/uL 11.7  7.5  7.1   Hemoglobin 13.0 - 17.0 g/dL 87.4  85.3  87.1   Hematocrit 39.0 - 52.0 % 35.4  43.6  37.3   Platelets 150 - 400 K/uL 185  225  168        Latest Ref Rng & Units 05/04/2024    6:49 AM 05/03/2024    6:26 AM 04/19/2024    5:30 AM  CMP  Glucose 70 - 99 mg/dL 834  97  99   BUN 6 - 20 mg/dL 10  8  12    Creatinine 0.61 - 1.24 mg/dL 9.29  9.28  9.31   Sodium 135 - 145 mmol/L 140  139  140   Potassium 3.5 - 5.1 mmol/L 3.6  4.0  3.8   Chloride 98 - 111 mmol/L 105  105  107   CO2 22 - 32 mmol/L 26  23  27    Calcium  8.9 - 10.3 mg/dL 8.8  9.2  8.6     No results found.   Discharge Instructions:   Discharge Instructions      Dear Terry Gross,  Thank you for allowing us  to care for you. You were admitted overnight for observation after undergoing a pulmonary arteriography with embolization of a pulmonary arteriovenous malformation (AVM) in your left lower lung. The procedure was successful, and you did not experience any complications during your hospital stay.  MEDICATION  CHANGES No new long-term medications were started during this admission  POST-HOSPITAL & CARE INSTRUCTIONS Please call to arrange follow up with your primary care physician in 1 week.  Go to your follow up appointments (listed below) Please seek immediate medical care if you experience: - Coughing up blood - Chest pain or shortness of breath - Dizziness or fainting - Bleeding, swelling, redness, or increasing pain at the groin access site - Fever or signs of infection  DOCTOR'S APPOINTMENT   Future Appointments  Date Time Provider Department Center  08/19/2024  9:30 AM HVC-ECHO 4 HVC-ECHO H&V    Take care and be well!  Internal Medicine Teaching Service Inpatient Team [place]  [facility]  [address] [place], [place] [address] [phone]      Signed: Alan Maiden, MD Internal Medicine  Psychiatry Resident, PGY-1 05/04/2024, 1:21 PM Please contact the on call pager after 5 pm and on weekends  at [phone].  "

## 2024-05-04 DIAGNOSIS — I280 Arteriovenous fistula of pulmonary vessels: Secondary | ICD-10-CM | POA: Diagnosis not present

## 2024-05-04 DIAGNOSIS — Z9889 Other specified postprocedural states: Secondary | ICD-10-CM | POA: Diagnosis not present

## 2024-05-04 DIAGNOSIS — Q2730 Arteriovenous malformation, site unspecified: Secondary | ICD-10-CM

## 2024-05-04 LAB — CBC
HCT: 35.4 % — ABNORMAL LOW (ref 39.0–52.0)
Hemoglobin: 12.5 g/dL — ABNORMAL LOW (ref 13.0–17.0)
MCH: 34 pg (ref 26.0–34.0)
MCHC: 35.3 g/dL (ref 30.0–36.0)
MCV: 96.2 fL (ref 80.0–100.0)
Platelets: 185 K/uL (ref 150–400)
RBC: 3.68 MIL/uL — ABNORMAL LOW (ref 4.22–5.81)
RDW: 12.7 % (ref 11.5–15.5)
WBC: 11.7 K/uL — ABNORMAL HIGH (ref 4.0–10.5)
nRBC: 0 % (ref 0.0–0.2)

## 2024-05-04 LAB — BASIC METABOLIC PANEL WITH GFR
Anion gap: 9 (ref 5–15)
BUN: 10 mg/dL (ref 6–20)
CO2: 26 mmol/L (ref 22–32)
Calcium: 8.8 mg/dL — ABNORMAL LOW (ref 8.9–10.3)
Chloride: 105 mmol/L (ref 98–111)
Creatinine, Ser: 0.7 mg/dL (ref 0.61–1.24)
GFR, Estimated: >60 mL/min
Glucose, Bld: 165 mg/dL — ABNORMAL HIGH (ref 70–99)
Potassium: 3.6 mmol/L (ref 3.5–5.1)
Sodium: 140 mmol/L (ref 135–145)

## 2024-05-04 NOTE — Plan of Care (Signed)
" °  Problem: Education: Goal: Knowledge of General Education information will improve Description: Including pain rating scale, medication(s)/side effects and non-pharmacologic comfort measures Outcome: Progressing   Problem: Clinical Measurements: Goal: Ability to maintain clinical measurements within normal limits will improve Outcome: Progressing   Problem: Health Behavior/Discharge Planning: Goal: Ability to manage health-related needs will improve Outcome: Progressing   Problem: Skin Integrity: Goal: Risk for impaired skin integrity will decrease Outcome: Progressing   Problem: Safety: Goal: Ability to remain free from injury will improve Outcome: Progressing   Problem: Pain Managment: Goal: General experience of comfort will improve and/or be controlled Outcome: Progressing   Problem: Clinical Measurements: Goal: Will remain free from infection Outcome: Progressing   "

## 2024-05-04 NOTE — Progress Notes (Signed)
 "   Referring Physician(s): Dr. Toribio Sharps  Supervising Physician: Jennefer Rover  Patient Status:  [facility] - In-pt  Chief Complaint: Pulmonary AVM  Subjective: Patient resting comfortably this AM after pAVM embolization yesterday.  Denies cough, shortness of breath, hemopytsis.  Overall his evening was uneventful.  He has had breakfast with tolerance and has ambulated to the restroom without difficulty.  CXR this AM shows stable finding without complication.  Mild WBC elevation likely reactive.   Allergies: Patient has no known allergies.  Medications: Prior to Admission medications  Medication Sig Start Date End Date Taking? Authorizing Provider  brimonidine  (ALPHAGAN ) 0.2 % ophthalmic solution Place 1 drop into the left eye 3 (three) times daily. 06/27/23  Yes [provider]  clopidogrel  (PLAVIX ) 75 MG tablet Take 1 tablet (75 mg total) by mouth daily. 12/28/23  Yes Tysinger, Alm RAMAN, PA-C  dorzolamide -timolol  (COSOPT ) 2-0.5 % ophthalmic solution Place 1 drop into the left eye 2 (two) times daily. 04/08/22  Yes [provider]  ferrous gluconate  (FERGON) 324 MG tablet Take 1 tablet (324 mg total) by mouth 2 (two) times daily with a meal. Patient taking differently: Take 324 mg by mouth daily. 12/28/23  Yes Tysinger, Alm RAMAN, PA-C  latanoprost  (XALATAN ) 0.005 % ophthalmic solution INSTILL 1 DROP IN LEFT EYE NIGHTLY Patient taking differently: Place 1 drop into both eyes at bedtime. 04/06/21  Yes Rankin, Arley LABOR, MD  methimazole  (TAPAZOLE ) 5 MG tablet Take 2.5 mg by mouth daily. 03/20/24  Yes [provider]  Omega-3 Fatty Acids (FISH OIL) 1200 MG CAPS Take 1,200 mg by mouth daily.   Yes [provider]  rosuvastatin  (CRESTOR ) 40 MG tablet Take 1 tablet (40 mg total) by mouth daily. 06/05/23  Yes Tysinger, Alm RAMAN, PA-C  tamsulosin  (FLOMAX ) 0.4 MG CAPS capsule Take 1 capsule (0.4 mg total) by mouth daily after supper. 06/05/23  Yes Tysinger, Alm RAMAN,  PA-C  nicotine  (NICODERM CQ  - DOSED IN MG/24 HOURS) 14 mg/24hr patch Place 1 patch (14 mg total) onto the skin daily. Patient not taking: Reported on 04/18/2024 12/07/23   Mannam, Praveen, MD  Varenicline  Tartrate, Starter, (CHANTIX  STARTING MONTH PAK) 0.5 MG X 11 & 1 MG X 42 TBPK Take 1 tablet by mouth daily. Patient not taking: Reported on 04/18/2024 12/07/23   Mannam, Praveen, MD     Vital Signs: BP (!) 106/56 (BP Location: Right Arm)   Pulse 71   Temp 98.6 F (37 C) (Oral)   Resp 16   SpO2 98%   Physical Exam NAD, alert Chest: no increased WOB, clear breath sounds.  Groin: soft, stable.  Dressing clean and dry.  No tenderness.   Imaging: DG Chest Port 1 View Result Date: 05/03/2024 CLINICAL DATA:  [id number] Post-operative state 252351 EXAM: PORTABLE CHEST 1 VIEW COMPARISON:  CHEST XR, 04/17/2024. CTA CHEST, 04/18/2024 AND 09/17/2023. FINDINGS: Cardiac silhouette is within normal limits. Mild burden of aortic arch atherosclerosis. The lungs are relatively well inflated. Near-resolution of prior LEFT lower lobe hazy pulmonary opacity. Linear retrocardiac opacities without focal consolidation. Mild, LEFT-greater-than-RIGHT pulmonary interstitial thickening. No pleural effusion or pneumothorax. Known LEFT pulmonary AVM embolization with small radiodense plugs. No interval osseous abnormality. IMPRESSION: 1. LEFT pulmonary AVM embolization without acute superimposed complication. 2. Mild LEFT pulmonary interstitial thickening consistent, and linear retrocardiac opacities likely atelectasis. No focal consolidation Electronically Signed   By: Thom Hall M.D.   On: 05/03/2024 19:17    Labs:  CBC: Recent Labs    04/17/24  2333 04/19/24 0530 05/03/24 0626 05/04/24 0649  WBC 7.6 7.1 7.5 11.7*  HGB 13.7 12.8* 14.6 12.5*  HCT 41.7 37.3* 43.6 35.4*  PLT 206 168 225 185    COAGS: Recent Labs    09/17/23 0019 05/03/24 0626  INR 1.0 1.0    BMP: Recent Labs    04/17/24 2333  04/19/24 0530 05/03/24 0626 05/04/24 0649  NA 138 140 139 140  K 3.9 3.8 4.0 3.6  CL 105 107 105 105  CO2 25 27 23 26   GLUCOSE 108* 99 97 165*  BUN 9 12 8 10   CALCIUM  8.7* 8.6* 9.2 8.8*  CREATININE 0.54* 0.68 0.71 0.70  GFRNONAA >60 >60 >60 >60    LIVER FUNCTION TESTS: Recent Labs    09/17/23 0019 04/17/24 2333  BILITOT 0.6 0.6  AST 20 17  ALT 15 16  ALKPHOS 56 58  PROT 7.3 7.0  ALBUMIN 4.0 3.6    Assessment and Plan: Pulmonary AVM embolization by Dr. Hughes 12/26 Patient admitted for observation overnight.  He has recovered well without concern or evidence of complication.  Dressing in place and can be removed tomorrow. Post procedure instructions given to patient.   Plan for follow-up with Dr. Hughes with repeat CTA Chest in 1 month.  This has been ordered and patient understands schedulers will contact to arrange date.  Stable for discharge home from IR perspective.   Electronically Signed: Daeja Helderman Sue-Ellen Mckensey Berghuis, PA 05/04/2024, 10:33 AM   I spent a total of 15 Minutes at the the patient's bedside AND on the patient's hospital floor or unit, greater than 50% of which was counseling/coordinating care for pulmonary AVM.       "

## 2024-05-06 ENCOUNTER — Encounter (HOSPITAL_COMMUNITY): Payer: Self-pay | Admitting: Interventional Radiology

## 2024-05-16 ENCOUNTER — Other Ambulatory Visit: Payer: Self-pay | Admitting: Interventional Radiology

## 2024-05-16 DIAGNOSIS — Q2730 Arteriovenous malformation, site unspecified: Secondary | ICD-10-CM

## 2024-06-03 ENCOUNTER — Inpatient Hospital Stay: Admission: RE | Admit: 2024-06-03 | Source: Ambulatory Visit

## 2024-06-04 ENCOUNTER — Inpatient Hospital Stay: Admission: RE | Admit: 2024-06-04

## 2024-06-05 ENCOUNTER — Inpatient Hospital Stay
Admission: RE | Admit: 2024-06-05 | Discharge: 2024-06-05 | Disposition: A | Source: Ambulatory Visit | Attending: Interventional Radiology

## 2024-06-05 DIAGNOSIS — Q2730 Arteriovenous malformation, site unspecified: Secondary | ICD-10-CM

## 2024-06-05 MED ORDER — IOPAMIDOL (ISOVUE-370) INJECTION 76%
75.0000 mL | Freq: Once | INTRAVENOUS | Status: AC | PRN
  Administered 2024-06-05: 75 mL via INTRAVENOUS

## 2024-06-11 ENCOUNTER — Ambulatory Visit
Admission: RE | Admit: 2024-06-11 | Discharge: 2024-06-11 | Disposition: A | Source: Ambulatory Visit | Attending: Student

## 2024-06-11 DIAGNOSIS — Q2730 Arteriovenous malformation, site unspecified: Secondary | ICD-10-CM

## 2024-06-11 NOTE — Progress Notes (Signed)
 "  Reason for visit: Follow up s/p pulmonary AVM embolization   Care Team(s) PCP: Bulah Alm RAMAN, PA-C Pulmonary Medicine: Mannam, Praveen, MD Cardiology Newman Lawrence, MD   Virtual Visit via Telephone Note   I connected with Mr Terry Gross on 06/11/24 by telephone and verified that I am speaking with the correct person using two identifiers. I discussed the limitations, risks, security and privacy concerns of performing an evaluation and management service by telephone and the availability of in-person appointments.  History of present illness:  Mr. Terry Gross is a pleasant 59 y/o M comorbid w PMHx including >20 PY smoking Hx, CAD, and L ICA occlusion. He was being followed with an annual  carotid duple, and had been taking Plavix . Pt developed a cough with intermittent hemoptysis and underwent CTA chest on 08/20/23 which showed LLL AVM. He was being followed by Pulmonary medicine and had another episode of hemoptysis in 09/2023 and 04/17/24. IR was consulted while inpatient for possible pulmonary angiogram with intervention.  Pt returned for ambulatory  micro-plug embolization of L PAVM on 05/03/24. He was discharged after overnight observation.   He denies any procedural discomfort or additinal hemoptysis.   Review of Systems: A 12-point ROS discussed, and pertinent positives are indicated in the HPI above.  All other systems are negative.  Past Medical History:  Diagnosis Date   Aortic atherosclerosis    Aortic stenosis    Aortic valve insufficiency 12/15/2014   Moderate by ECHO 12/15/14 normal LV function    Back pain    Carotid artery occlusion    Cataract    Hyperlipidemia    Hypertension    Impaired fasting blood sugar    Left epiretinal membrane 09/12/2019   This condition is resolved after vitrectomy and membrane peel was performed, 02/06/2019.  Topographic distortion completely released.   Loss of balance    Peripheral vascular disease     Pulmonary arteriovenous malformation    Smoker    Vitreous hemorrhage of left eye (HCC) 06/10/2019    Past Surgical History:  Procedure Laterality Date   CATARACT EXTRACTION     COLONOSCOPY  07/2020   never   IR ANGIOGRAM PULMONARY LEFT SELECTIVE  05/03/2024   IR ANGIOGRAM PULMONARY LEFT SELECTIVE  05/03/2024   IR EMBO ART  VEN HEMORR LYMPH EXTRAV  INC GUIDE ROADMAPPING  05/03/2024   IR US  GUIDE VASC ACCESS RIGHT  05/03/2024   RADIOLOGY WITH ANESTHESIA Left 05/03/2024   Procedure: RADIOLOGY WITH ANESTHESIA;  Surgeon: Hughes Simmonds, MD;  Location: [facility] OR;  Service: Radiology;  Laterality: Left;  Left pulmonary angiography    Allergies: Patient has no known allergies.  Medications: Prior to Admission medications  Medication Sig Start Date End Date Taking? Authorizing Provider  brimonidine  (ALPHAGAN ) 0.2 % ophthalmic solution Place 1 drop into the left eye 3 (three) times daily. 06/27/23   [provider]  clopidogrel  (PLAVIX ) 75 MG tablet Take 1 tablet (75 mg total) by mouth daily. 12/28/23   Tysinger, Alm RAMAN, PA-C  dorzolamide -timolol  (COSOPT ) 2-0.5 % ophthalmic solution Place 1 drop into the left eye 2 (two) times daily. 04/08/22   [provider]  ferrous gluconate  (FERGON) 324 MG tablet Take 1 tablet (324 mg total) by mouth 2 (two) times daily with a meal. Patient taking differently: Take 324 mg by mouth daily. 12/28/23   Tysinger, Alm RAMAN, PA-C  latanoprost  (XALATAN ) 0.005 % ophthalmic solution INSTILL 1 DROP IN LEFT EYE NIGHTLY Patient taking differently: Place 1 drop into  both eyes at bedtime. 04/06/21   Rankin, Arley LABOR, MD  methimazole  (TAPAZOLE ) 5 MG tablet Take 2.5 mg by mouth daily. 03/20/24   [provider]  Omega-3 Fatty Acids (FISH OIL) 1200 MG CAPS Take 1,200 mg by mouth daily.    [provider]  rosuvastatin  (CRESTOR ) 40 MG tablet Take 1 tablet (40 mg total) by mouth daily. 06/05/23   Tysinger, Alm RAMAN, PA-C  tamsulosin  (FLOMAX ) 0.4 MG  CAPS capsule Take 1 capsule (0.4 mg total) by mouth daily after supper. 06/05/23   Tysinger, Alm RAMAN, PA-C     Family History  Problem Relation Age of Onset   CVA Mother    Other Mother        brain tumor   Other Father        complications from prostate sugery   Cancer Father        prostate   Thyroid  disease Neg Hx    Heart disease Neg Hx    Diabetes Neg Hx    Kidney disease Neg Hx    Hypertension Neg Hx    Hyperlipidemia Neg Hx     Social History   Socioeconomic History   Marital status: Married    Spouse name: Not on file   Number of children: 0   Years of education: Not on file   Highest education level: Not on file  Occupational History   Not on file  Tobacco Use   Smoking status: Every Day    Current packs/day: 0.25    Average packs/day: 0.3 packs/day for 32.0 years (8.0 ttl pk-yrs)    Types: Cigarettes    Passive exposure: Never   Smokeless tobacco: Never   Tobacco comments:    4-5 cig a day  Vaping Use   Vaping status: Never Used  Substance and Sexual Activity   Alcohol use: No   Drug use: No   Sexual activity: Not on file  Other Topics Concern   Not on file  Social History Narrative   Married, no children.  Exercise some with calisthenics, walking.    From [place] originally.   11/2022   Social Drivers of Health   Tobacco Use: High Risk (05/03/2024)   Patient History    Smoking Tobacco Use: Every Day    Smokeless Tobacco Use: Never    Passive Exposure: Never  Financial Resource Strain: Not on file  Food Insecurity: Food Insecurity Present (05/03/2024)   Epic    Worried About Programme Researcher, Broadcasting/film/video in the Last Year: Sometimes true    [employer] of Food in the Last Year: Sometimes true  Transportation Needs: No Transportation Needs (05/03/2024)   Epic    Lack of Transportation (Medical): No    Lack of Transportation (Non-Medical): No  Physical Activity: Not on file  Stress: Not on file  Social Connections: Not on file  Depression (EYV7-0): Low Risk  (12/27/2023)   Depression (PHQ2-9)    PHQ-2 Score: 0  Alcohol Screen: Not on file  Housing: Low Risk (05/03/2024)   Epic    Unable to Pay for Housing in the Last Year: No    Number of Times Moved in the Last Year: 0    Homeless in the Last Year: No  Utilities: Not At Risk (05/03/2024)   Epic    Threatened with loss of utilities: No  Health Literacy: Not on file     Vital Signs: There were no vitals taken for this visit.  Physical Exam Deferred secondary to  virtual visit.  Imaging:     CTA Chest, 05/26/24 IMPRESSION:  1. Interval successful embolization of symptomatic left lower lobe pulmonary arteriovenous malformation.  The AVM is de vascularized and involuting as expected.  2. No acute cardiopulmonary process.  3. Aortic and coronary artery atherosclerotic vascular calcifications.  4. 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  No results found.  Labs:  CBC: Recent Labs    04/17/24 2333 04/19/24 0530 05/03/24 0626 05/04/24 0649  WBC 7.6 7.1 7.5 11.7*  HGB 13.7 12.8* 14.6 12.5*  HCT 41.7 37.3* 43.6 35.4*  PLT 206 168 225 185    COAGS: Recent Labs    09/17/23 0019 05/03/24 0626  INR 1.0 1.0    BMP: Recent Labs    04/17/24 2333 04/19/24 0530 05/03/24 0626 05/04/24 0649  NA 138 140 139 140  K 3.9 3.8 4.0 3.6  CL 105 107 105 105  CO2 25 27 23 26   GLUCOSE 108* 99 97 165*  BUN 9 12 8 10   CALCIUM  8.7* 8.6* 9.2 8.8*  CREATININE 0.54* 0.68 0.71 0.70  GFRNONAA >60 >60 >60 >60    Assessment and Plan:  59 y/o M comorbid w PMHx significant for recurrent hemoptysis requiring multiple admissions. No reported family Hx suggestive of hereditary hemorrhagic telangiectasia (HHT).  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  CTA chest w  7-91mm superior segment LLL pAVM, now s/p L PAVM micro-plug embolization on 05/03/24.  *decreased size of L PAVM without residual enhancement. OK to resume AC *Pt back to  baseline. No post procedural concerns. *12 mos follow up imaging w CTA PE protocol (Pls assign to vascular rad to read) *Will see him back, in virtual clinic, for imaging review in 12 mos. *Long smoking Hx, recommend lung CA screener follow up per PCCM. *comorbid followup per PCP   Thank you for allowing us  to participate in the care of this Patient. Please contact me with questions, concerns, or if new issues arise  Electronically Signed:  Thom Hall, MD [facility]   Pager. [phone] Clinic. [phone]  I spent a total of 30 Minutes of non-face-to-face time in clinical consultation, greater than 50% of which was counseling/coordinating care for Mr Sylvia Kondracki evaluation for pulmonary AVM treatment  "

## 2024-06-13 ENCOUNTER — Telehealth: Payer: Self-pay

## 2024-06-13 NOTE — Telephone Encounter (Signed)
 Called to schedule patient for a f/u apt per dr Theophilus. Please schedule. Thanks

## 2024-06-13 NOTE — Telephone Encounter (Signed)
 Pt scheduled

## 2024-07-04 ENCOUNTER — Ambulatory Visit: Admitting: Pulmonary Disease

## 2024-08-19 ENCOUNTER — Other Ambulatory Visit (HOSPITAL_COMMUNITY)
